# Patient Record
Sex: Female | Born: 1950 | Race: White | Hispanic: No | Marital: Single | Smoking: Never smoker
Health system: Southern US, Community
[De-identification: ages and names within clinical notes are randomized; demographics above are authoritative.]

## PROBLEM LIST (undated history)

## (undated) DIAGNOSIS — D649 Anemia, unspecified: Secondary | ICD-10-CM

## (undated) DIAGNOSIS — I517 Cardiomegaly: Secondary | ICD-10-CM

## (undated) DIAGNOSIS — K635 Polyp of colon: Secondary | ICD-10-CM

## (undated) DIAGNOSIS — Q2381 Bicuspid aortic valve: Secondary | ICD-10-CM

## (undated) DIAGNOSIS — K5790 Diverticulosis of intestine, part unspecified, without perforation or abscess without bleeding: Secondary | ICD-10-CM

## (undated) DIAGNOSIS — M1990 Unspecified osteoarthritis, unspecified site: Secondary | ICD-10-CM

## (undated) DIAGNOSIS — E119 Type 2 diabetes mellitus without complications: Secondary | ICD-10-CM

## (undated) DIAGNOSIS — T7840XA Allergy, unspecified, initial encounter: Secondary | ICD-10-CM

## (undated) DIAGNOSIS — Q231 Congenital insufficiency of aortic valve: Secondary | ICD-10-CM

## (undated) DIAGNOSIS — F419 Anxiety disorder, unspecified: Secondary | ICD-10-CM

## (undated) DIAGNOSIS — C801 Malignant (primary) neoplasm, unspecified: Secondary | ICD-10-CM

## (undated) DIAGNOSIS — K589 Irritable bowel syndrome without diarrhea: Secondary | ICD-10-CM

## (undated) DIAGNOSIS — I10 Essential (primary) hypertension: Secondary | ICD-10-CM

## (undated) DIAGNOSIS — I77810 Thoracic aortic ectasia: Secondary | ICD-10-CM

## (undated) HISTORY — DX: Diverticulosis of intestine, part unspecified, without perforation or abscess without bleeding: K57.90

## (undated) HISTORY — PX: COLONOSCOPY: SHX174

## (undated) HISTORY — DX: Unspecified osteoarthritis, unspecified site: M19.90

## (undated) HISTORY — PX: TOOTH EXTRACTION: SUR596

## (undated) HISTORY — DX: Polyp of colon: K63.5

## (undated) HISTORY — DX: Irritable bowel syndrome, unspecified: K58.9

## (undated) HISTORY — DX: Cardiomegaly: I51.7

## (undated) HISTORY — DX: Anemia, unspecified: D64.9

## (undated) HISTORY — DX: Anxiety disorder, unspecified: F41.9

## (undated) HISTORY — DX: Congenital insufficiency of aortic valve: Q23.1

## (undated) HISTORY — PX: POLYPECTOMY: SHX149

## (undated) HISTORY — DX: Essential (primary) hypertension: I10

## (undated) HISTORY — PX: BELPHAROPTOSIS REPAIR: SHX369

## (undated) HISTORY — DX: Thoracic aortic ectasia: I77.810

## (undated) HISTORY — DX: Bicuspid aortic valve: Q23.81

## (undated) HISTORY — DX: Allergy, unspecified, initial encounter: T78.40XA

## (undated) HISTORY — PX: TUBAL LIGATION: SHX77

---

## 1999-03-16 ENCOUNTER — Other Ambulatory Visit: Admission: RE | Admit: 1999-03-16 | Discharge: 1999-03-16 | Payer: Self-pay | Admitting: Obstetrics and Gynecology

## 2000-06-05 ENCOUNTER — Other Ambulatory Visit: Admission: RE | Admit: 2000-06-05 | Discharge: 2000-06-05 | Payer: Self-pay | Admitting: Obstetrics and Gynecology

## 2000-06-12 ENCOUNTER — Encounter: Admission: RE | Admit: 2000-06-12 | Discharge: 2000-06-12 | Payer: Self-pay | Admitting: Obstetrics and Gynecology

## 2000-06-12 ENCOUNTER — Encounter: Payer: Self-pay | Admitting: Obstetrics and Gynecology

## 2001-06-19 ENCOUNTER — Encounter: Payer: Self-pay | Admitting: Obstetrics and Gynecology

## 2001-06-19 ENCOUNTER — Encounter: Admission: RE | Admit: 2001-06-19 | Discharge: 2001-06-19 | Payer: Self-pay | Admitting: Obstetrics and Gynecology

## 2001-07-14 ENCOUNTER — Other Ambulatory Visit: Admission: RE | Admit: 2001-07-14 | Discharge: 2001-07-14 | Payer: Self-pay | Admitting: Obstetrics and Gynecology

## 2001-10-09 ENCOUNTER — Encounter: Admission: RE | Admit: 2001-10-09 | Discharge: 2002-01-07 | Payer: Self-pay | Admitting: Family Medicine

## 2002-07-02 ENCOUNTER — Encounter: Admission: RE | Admit: 2002-07-02 | Discharge: 2002-07-02 | Payer: Self-pay | Admitting: Obstetrics and Gynecology

## 2002-07-02 ENCOUNTER — Encounter: Payer: Self-pay | Admitting: Obstetrics and Gynecology

## 2002-08-31 ENCOUNTER — Other Ambulatory Visit: Admission: RE | Admit: 2002-08-31 | Discharge: 2002-08-31 | Payer: Self-pay | Admitting: Obstetrics and Gynecology

## 2002-12-17 ENCOUNTER — Ambulatory Visit (HOSPITAL_COMMUNITY): Admission: RE | Admit: 2002-12-17 | Discharge: 2002-12-17 | Payer: Self-pay | Admitting: Gastroenterology

## 2002-12-17 ENCOUNTER — Encounter (INDEPENDENT_AMBULATORY_CARE_PROVIDER_SITE_OTHER): Payer: Self-pay

## 2002-12-17 ENCOUNTER — Encounter (INDEPENDENT_AMBULATORY_CARE_PROVIDER_SITE_OTHER): Payer: Self-pay | Admitting: *Deleted

## 2003-09-01 ENCOUNTER — Other Ambulatory Visit: Admission: RE | Admit: 2003-09-01 | Discharge: 2003-09-01 | Payer: Self-pay | Admitting: Obstetrics and Gynecology

## 2003-09-15 ENCOUNTER — Encounter: Admission: RE | Admit: 2003-09-15 | Discharge: 2003-09-15 | Payer: Self-pay | Admitting: Obstetrics and Gynecology

## 2004-02-17 ENCOUNTER — Emergency Department (HOSPITAL_COMMUNITY): Admission: EM | Admit: 2004-02-17 | Discharge: 2004-02-17 | Payer: Self-pay | Admitting: Emergency Medicine

## 2004-04-25 ENCOUNTER — Encounter: Admission: RE | Admit: 2004-04-25 | Discharge: 2004-04-25 | Payer: Self-pay | Admitting: Family Medicine

## 2004-09-15 ENCOUNTER — Ambulatory Visit (HOSPITAL_COMMUNITY): Admission: RE | Admit: 2004-09-15 | Discharge: 2004-09-15 | Payer: Self-pay | Admitting: Obstetrics and Gynecology

## 2004-12-01 ENCOUNTER — Ambulatory Visit: Payer: Self-pay | Admitting: Internal Medicine

## 2005-01-23 ENCOUNTER — Ambulatory Visit: Payer: Self-pay | Admitting: Internal Medicine

## 2005-07-19 ENCOUNTER — Ambulatory Visit: Payer: Self-pay | Admitting: Internal Medicine

## 2005-09-27 ENCOUNTER — Encounter: Admission: RE | Admit: 2005-09-27 | Discharge: 2005-09-27 | Payer: Self-pay | Admitting: Obstetrics and Gynecology

## 2006-08-26 ENCOUNTER — Ambulatory Visit: Payer: Self-pay | Admitting: Internal Medicine

## 2006-10-01 ENCOUNTER — Encounter: Admission: RE | Admit: 2006-10-01 | Discharge: 2006-10-01 | Payer: Self-pay | Admitting: Obstetrics and Gynecology

## 2007-10-06 ENCOUNTER — Encounter: Admission: RE | Admit: 2007-10-06 | Discharge: 2007-10-06 | Payer: Self-pay | Admitting: Obstetrics and Gynecology

## 2007-10-15 ENCOUNTER — Encounter: Admission: RE | Admit: 2007-10-15 | Discharge: 2007-10-15 | Payer: Self-pay | Admitting: Obstetrics and Gynecology

## 2007-10-16 ENCOUNTER — Encounter: Payer: Self-pay | Admitting: Internal Medicine

## 2007-10-20 ENCOUNTER — Telehealth: Payer: Self-pay | Admitting: Internal Medicine

## 2008-09-06 DIAGNOSIS — K5289 Other specified noninfective gastroenteritis and colitis: Secondary | ICD-10-CM

## 2008-09-06 DIAGNOSIS — I10 Essential (primary) hypertension: Secondary | ICD-10-CM

## 2008-09-06 DIAGNOSIS — Z860100 Personal history of colon polyps, unspecified: Secondary | ICD-10-CM | POA: Insufficient documentation

## 2008-09-06 DIAGNOSIS — F411 Generalized anxiety disorder: Secondary | ICD-10-CM

## 2008-09-06 DIAGNOSIS — K589 Irritable bowel syndrome without diarrhea: Secondary | ICD-10-CM

## 2008-09-08 ENCOUNTER — Ambulatory Visit: Payer: Self-pay | Admitting: Internal Medicine

## 2008-09-08 DIAGNOSIS — K921 Melena: Secondary | ICD-10-CM | POA: Insufficient documentation

## 2008-09-08 LAB — CONVERTED CEMR LAB
Basophils Absolute: 0 10*3/uL (ref 0.0–0.1)
HCT: 42.4 % (ref 36.0–46.0)
Hemoglobin: 14.2 g/dL (ref 12.0–15.0)
Iron: 56 ug/dL (ref 42–145)
Lymphs Abs: 0.9 10*3/uL (ref 0.7–4.0)
MCV: 87.4 fL (ref 78.0–100.0)
Monocytes Absolute: 0.7 10*3/uL (ref 0.1–1.0)

## 2008-09-24 ENCOUNTER — Telehealth: Payer: Self-pay | Admitting: Internal Medicine

## 2008-09-28 ENCOUNTER — Ambulatory Visit: Payer: Self-pay | Admitting: Internal Medicine

## 2008-09-30 LAB — CONVERTED CEMR LAB
OCCULT 1: NEGATIVE
OCCULT 2: NEGATIVE

## 2008-10-06 ENCOUNTER — Encounter: Admission: RE | Admit: 2008-10-06 | Discharge: 2008-10-06 | Payer: Self-pay | Admitting: Obstetrics and Gynecology

## 2008-10-19 ENCOUNTER — Ambulatory Visit: Payer: Self-pay | Admitting: Internal Medicine

## 2008-10-19 LAB — CONVERTED CEMR LAB
OCCULT 2: NEGATIVE
OCCULT 3: NEGATIVE

## 2008-10-20 ENCOUNTER — Encounter: Payer: Self-pay | Admitting: Internal Medicine

## 2008-10-20 ENCOUNTER — Telehealth: Payer: Self-pay | Admitting: Internal Medicine

## 2008-12-21 ENCOUNTER — Encounter: Admission: RE | Admit: 2008-12-21 | Discharge: 2009-03-21 | Payer: Self-pay | Admitting: Family Medicine

## 2009-01-03 ENCOUNTER — Encounter: Admission: RE | Admit: 2009-01-03 | Discharge: 2009-01-03 | Payer: Self-pay | Admitting: Orthopedic Surgery

## 2009-03-31 ENCOUNTER — Encounter: Payer: Self-pay | Admitting: Internal Medicine

## 2009-09-12 ENCOUNTER — Telehealth: Payer: Self-pay | Admitting: Internal Medicine

## 2009-09-21 ENCOUNTER — Encounter: Payer: Self-pay | Admitting: Internal Medicine

## 2009-10-07 ENCOUNTER — Encounter: Admission: RE | Admit: 2009-10-07 | Discharge: 2009-10-07 | Payer: Self-pay | Admitting: Obstetrics and Gynecology

## 2009-11-01 ENCOUNTER — Encounter (INDEPENDENT_AMBULATORY_CARE_PROVIDER_SITE_OTHER): Payer: Self-pay | Admitting: *Deleted

## 2009-12-09 ENCOUNTER — Telehealth: Payer: Self-pay | Admitting: Internal Medicine

## 2010-01-24 ENCOUNTER — Telehealth (INDEPENDENT_AMBULATORY_CARE_PROVIDER_SITE_OTHER): Payer: Self-pay | Admitting: *Deleted

## 2010-01-27 ENCOUNTER — Ambulatory Visit: Payer: Self-pay | Admitting: Internal Medicine

## 2010-06-09 ENCOUNTER — Telehealth: Payer: Self-pay | Admitting: Internal Medicine

## 2010-06-10 ENCOUNTER — Other Ambulatory Visit: Payer: Self-pay | Admitting: Obstetrics and Gynecology

## 2010-06-10 DIAGNOSIS — Z0000 Encounter for general adult medical examination without abnormal findings: Secondary | ICD-10-CM

## 2010-06-19 ENCOUNTER — Encounter: Payer: Self-pay | Admitting: Internal Medicine

## 2010-06-20 NOTE — Progress Notes (Signed)
  Phone Note Other Incoming   Request: Send information Summary of Call: Request received from Disability Determination Services forwarded to [employer].       

## 2010-06-20 NOTE — Progress Notes (Signed)
Summary: Medication refill  Phone Note Call from Patient Call back at Home Phone [phone]   Caller: Patient Call For: Dr. Juanda Chance Reason for Call: Refill Medication Summary of Call: Refill on her [facility] appt. for 01-27-10 Initial call taken by: Karna Christmas,  December 09, 2009 4:28 PM  Follow-up for Phone Call        Prescription sent to local pharmacy until her appointment on 01/27/10. Follow-up by: Lamona Curl CMA Duncan Dull),  December 09, 2009 4:51 PM    New/Updated Medications: ALPRAZOLAM 0.25 MG TABS (ALPRAZOLAM) Take 1 tablet by mouth twice a day as needed. MUST KEEP OFFICE VISIT FOR FURTHER REFILLS!! Prescriptions: ALPRAZOLAM 0.25 MG TABS (ALPRAZOLAM) Take 1 tablet by mouth twice a day as needed. MUST KEEP OFFICE VISIT FOR FURTHER REFILLS!!  #60 x 1   Entered by:   Lamona Curl CMA (AAMA)   Authorized by:   Hart Carwin MD   Signed by:   Lamona Curl CMA (AAMA) on 12/09/2009   Method used:   Printed then faxed to ...       [facility] [place] [phone][facility] ([facility]ail)       [phone] [address]       [place], [place]  [address]       Ph: [phone] or [phone]       Fax: [phone]   RxID:   [id number]

## 2010-06-20 NOTE — Progress Notes (Signed)
Summary: refill  Phone Note Call from Patient Call back at Home Phone [phone]   Caller: Patient Call For: Juanda Chance Reason for Call: Talk to Nurse Summary of Call: Patient needs refills on her Xanax Initial call taken by: Tawni Levy,  September 12, 2009 11:51 AM  Follow-up for Phone Call        I have explained to the patient that she should have enough refills on xanax until 09/28/09 (she was given #180, enough for 3 months supply if she is taking 2 daily and was also given an additional refill, which should make prescription last until 09/28/09). I asked patient if she is taking prescription only two times a day at the most, and she states that she has been taking as directed. Patient will call back closer to 09/28/09 for refills. Follow-up by: Lamona Curl CMA Duncan Dull),  September 12, 2009 12:03 PM

## 2010-06-20 NOTE — Letter (Signed)
Summary: Colonoscopy Letter  [facility]  [address] [place], [place] [address]   Phone: [phone]  Fax: [phone]      November 01, 2009 MRN: [id number]   BEYA TIPPS [address] [place], [place]  [address]   Dear Ms. Ardelle Anton,   According to your medical record, it is time for you to schedule a Colonoscopy. The American Cancer Society recommends this procedure as a method to detect early colon cancer. Patients with a family history of colon cancer, or a personal history of colon polyps or inflammatory bowel disease are at increased risk.  This letter has beeen generated based on the recommendations made at the time of your procedure. If you feel that in your particular situation this may no longer apply, please contact our office.  Please call our office at [phone] to schedule this appointment or to update your records at your earliest convenience.  Thank you for cooperating with [place] to provide you with the very best care possible.   Sincerely,  Hedwig Morton. Juanda Chance, M.D.  [facility] Gastroenterology Division [phone]

## 2010-06-20 NOTE — Miscellaneous (Signed)
Summary: Xanax Rx  Clinical Lists Changes  Medications: Changed medication from ALPRAZOLAM 0.25 MG TABS (ALPRAZOLAM) Take 1 tablet by mouth twice a day as needed. to ALPRAZOLAM 0.25 MG TABS (ALPRAZOLAM) Take 1 tablet by mouth twice a day as needed. MUST HAVE OFFICE VISIT FOR FURTHER REFILLS - Signed Rx of ALPRAZOLAM 0.25 MG TABS (ALPRAZOLAM) Take 1 tablet by mouth twice a day as needed. MUST HAVE OFFICE VISIT FOR FURTHER REFILLS;  #180 x 0;  Signed;  Entered by: Lamona Curl CMA (AAMA);  Authorized by: Hart Carwin MD;  Method used: [facility]. [phone]*, [address], [place], [place]  [address], Ph: [phone] or [phone], Fax: [phone]    Prescriptions: ALPRAZOLAM 0.25 MG TABS (ALPRAZOLAM) Take 1 tablet by mouth twice a day as needed. MUST HAVE OFFICE VISIT FOR FURTHER REFILLS  #180 x 0   Entered by:   Lamona Curl CMA (AAMA)   Authorized by:   Hart Carwin MD   Signed by:   Lamona Curl CMA (AAMA) on 09/21/2009   Method used:   Printed then faxed to ...       [facility] [place] [phone]* (retail)       [phone] [address]       [place], [place]  [address]       Ph: [phone] or [phone]       Fax: [phone]   RxID:   [id number]

## 2010-06-20 NOTE — Assessment & Plan Note (Signed)
Summary: med refill--ch.   History of Present Illness Primary GI MD: [name] MD Primary Provider: Laurann Montana, MD Chief Complaint: yearly f/u for IBS and medication refills. No GI symptoms.  History of Present Illness:   This is a 60 year old white female with diarrhea predominant irritable bowel syndrome. This is a followup appointment. She has lost 50 pounds on a modified diet from 250 pounds to a current 212 pounds and her diarrhea has completely resolved. Her diabetes has improved in general. She has been very happy. She gives credit to the Xanax 0.25 mg twice a day which seems to have helped make her to follow  her diet. A colonoscopy in July 2004 showed 2 hyperplastic polyps. A recall colonoscopy would be due now but she is currently unemployed and would like to delay the colonoscopy. Her last hemoglobin was 14.2, hematocrit was 42.4 with her iron saturation was 13%. Her stool Hemoccult cards were negative. She needs refill on her Xanax.   GI Review of Systems      Denies abdominal pain, acid reflux, belching, bloating, chest pain, dysphagia with liquids, dysphagia with solids, heartburn, loss of appetite, nausea, vomiting, vomiting blood, weight loss, and  weight gain.        Denies anal fissure, black tarry stools, change in bowel habit, constipation, diarrhea, diverticulosis, fecal incontinence, heme positive stool, hemorrhoids, irritable bowel syndrome, jaundice, light color stool, liver problems, rectal bleeding, and  rectal pain.    Current Medications (verified): 1)  Alprazolam 0.25 Mg Tabs (Alprazolam) .... Take 1 Tablet By Mouth Twice A Day As Needed. Must Keep Office Visit For Further Refills!! 2)  Cholestyramine 4 Gm Pack (Cholestyramine) .... Take 1/2-1 Packet Daily Dissolved in Water or Juice As Needed Take 1 Hour Apart From All Other Medications! 3)  Glipizide 10 Mg Tabs (Glipizide) .... Two Times A Day 4)  Quinapril Hcl 40 Mg Tabs (Quinapril Hcl) .... Once Daily 5)   Amlodipine Besylate 10 Mg Tabs (Amlodipine Besylate) .... Once Daily 6)  Cinnamon 500 Mg Tabs (Cinnamon) .... Once Daily 7)  Aspirin 81 Mg Tbec (Aspirin) .... Once Daily 8)  Calcium 600/vitamin D 600-400 Mg-Unit Tabs (Calcium Carbonate-Vitamin D) .... Once Daily 9)  Centrum Silver Ultra Womens  Tabs (Multiple Vitamins-Minerals) .... Once Daily 10)  Icaps  Caps (Multiple Vitamins-Minerals) .... Two Times A Day 11)  Onglyza 5 Mg Tabs (Saxagliptin Hcl) .... One Tablet By Mouth Once Daily 12)  Actos 15 Mg Tabs (Pioglitazone Hcl) .... One Tablet By Mouth Once Daily 13)  Meloxicam 15 Mg Tabs (Meloxicam) .... One Tablet By Mouth At Bedtime 14)  Fluoxetine Hcl 20 Mg Caps (Fluoxetine Hcl) .... One Capsule By Mouth Once Daily 15)  Losartan Potassium-Hctz 100-12.5 Mg Tabs (Losartan Potassium-Hctz) .... One Tablet By Mouth Once Daily 16)  Fish Oil 1000 Mg Caps (Omega-3 Fatty Acids) .... One Capsule By Mouth Once Daily 17)  Garlic Oil 1000 Mg Caps (Garlic) .... One Capsule By Mouth Once Daily  Allergies (verified): No Known Drug Allergies  Past History:  Past Medical History: Reviewed history from 09/06/2008 and no changes required. Current Problems:  ANXIETY (ICD-300.00) DIABETES MELLITUS (ICD-250.00) HYPERTENSION (ICD-401.9) IRRITABLE BOWEL SYNDROME (ICD-564.1) Hx of GASTROENTERITIS (ICD-558.9) COLONIC POLYPS, HYPERPLASTIC, HX OF (ICD-V12.72)  Past Surgical History: Reviewed history from 09/06/2008 and no changes required. Tubal Ligation  Family History: Reviewed history from 09/06/2008 and no changes required. Family History of Diabetes: Uncle No FH of Colon Cancer:  Social History: Reviewed history from 09/08/2008 and no  changes required. 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   Review of Systems  The patient denies allergy/sinus, anemia, anxiety-new, arthritis/joint pain, back pain, blood in urine, breast changes/lumps, change in vision,  confusion, cough, coughing up blood, depression-new, fainting, fatigue, fever, headaches-new, hearing problems, heart murmur, heart rhythm changes, itching, menstrual pain, muscle pains/cramps, night sweats, nosebleeds, pregnancy symptoms, shortness of breath, skin rash, sleeping problems, sore throat, swelling of feet/legs, swollen lymph glands, thirst - excessive , urination - excessive , urination changes/pain, urine leakage, vision changes, and voice change.         Pertinent positive and negative review of systems were noted in the above HPI. All other ROS was otherwise negative.   Vital Signs:  Patient profile:   60 year old female Height:      65 inches Weight:      212 pounds BMI:     35.41 Pulse rate:   60 / minute Pulse rhythm:   regular BP sitting:   128 / 70  (left arm) Cuff size:   regular  Vitals Entered By: Christie Nottingham CMA Duncan Dull) (January 27, 2010 10:01 AM)  Physical Exam  General:  Well developed, well nourished, no acute distress. Eyes:  PERRLA, no icterus. Mouth:  No deformity or lesions, dentition normal. Neck:  Supple; no masses or thyromegaly. Lungs:  Clear throughout to auscultation. Heart:  Regular rate and rhythm; no murmurs, rubs,  or bruits. Abdomen:  soft mildly obese abdomen with normoactive bowel sounds. No tenderness. Liver edge at costal margin. No distention. Extremities:  No clubbing, cyanosis, edema or deformities noted. Skin:  Intact without significant lesions or rashes. Psych:  Alert and cooperative. Normal mood and affect.   Impression & Recommendations:  Problem # 1:  IRRITABLE BOWEL SYNDROME (ICD-564.1) Patient has IBS which is under good control with a modified diet and an intentional weight loss of 50 pounds. She is managing her anxiety and stress with Xanax 0.25 mg twice a day. We will refill her medications.  Problem # 2:  COLONIC POLYPS, HYPERPLASTIC, HX OF (ICD-V12.72) Assessment: New Patient has 2 hyperplastic polyps on a  colonoscopy in 2004. Patient received a recall letter for a colonoscopy but cannot afford to have one at this time while she is unemployed. We will reconsider a colonoscopy in one .year.  Patient Instructions: 1)  Continue a carbohydrate modified diet and weight loss. 2)  Resume Xanax 0.25 mg p.o. b.i.d. 3)  Recall colonoscopy August 2012. 4)  Copy sent to : Dr C.White 5)  The medication list was reviewed and reconciled.  All changed / newly prescribed medications were explained.  A complete medication list was provided to the patient / caregiver. Prescriptions: ALPRAZOLAM 0.25 MG TABS (ALPRAZOLAM) Take 1 tablet by mouth twice a day as needed. NOT TO BE FILLED BEFORE 02/09/10  #60 x 3   Entered by:   Lamona Curl CMA (AAMA)   Authorized by:   Hart Carwin MD   Signed by:   Lamona Curl CMA (AAMA) on 01/27/2010   Method used:   Printed then faxed to ...       [facility] [place] [phone]* (retail)       [phone] [address]       [place], [place]  [address]       Ph: [phone] or [phone]       Fax: [phone]   RxID:   [id number]

## 2010-06-22 NOTE — Progress Notes (Signed)
Summary: ibs  Phone Note Call from Patient Call back at Home Phone [phone]   Caller: Patient Call For: Dr Juanda Chance Reason for Call: Talk to Nurse Summary of Call: Patient states that she's having trouble with IBS wants to speak to nurse Initial call taken by: Tawni Levy,  June 09, 2010 9:16 AM  Follow-up for Phone Call        Patient called to let Dr. Juanda Chance know she had  liquid diarrhea 4-5 times last night after supper.No bleeding. She took cholesyramine 1/2 pack and it has resolved. She thought Dr. Juanda Chance would want to know. Follow-up by: Jesse Fall RN,  June 09, 2010 10:46 AM  Additional Follow-up for Phone Call Additional follow up Details #1::        sounds like her IBS flare up. if diarrhea continues, consider stool culture, C.Diff, lactoferin and empirical Flagyl 250 three times a day x 1 week. Additional Follow-up by: Hart Carwin MD,  June 09, 2010 2:33 PM    Additional Follow-up for Phone Call Additional follow up Details #2::    Patient notified to call me on Monday if diarrhea continues. Jesse Fall RN  June 09, 2010 3:22 PM Called patient to check up on her. She has not had anymore diarrhea. Follow-up by: Jesse Fall RN,  June 12, 2010 9:27 AM

## 2010-06-28 NOTE — Miscellaneous (Signed)
Summary: xanax refills  Clinical Lists Changes  Medications: Changed medication from ALPRAZOLAM 0.25 MG TABS (ALPRAZOLAM) Take 1 tablet by mouth twice a day as needed. NOT TO BE FILLED BEFORE 02/09/10 to ALPRAZOLAM 0.25 MG TABS (ALPRAZOLAM) Take 1 tablet by mouth twice a day as needed. - Signed Rx of ALPRAZOLAM 0.25 MG TABS (ALPRAZOLAM) Take 1 tablet by mouth twice a day as needed.;  #60 x 1;  Signed;  Entered by: Lamona Curl CMA (AAMA);  Authorized by: Hart Carwin MD;  Method used: Printed then [facility] to [facility]. [phone]*, [address] [place], [place]  [address], Ph: [phone] or [phone], Fax: [phone]    Prescriptions: ALPRAZOLAM 0.25 MG TABS (ALPRAZOLAM) Take 1 tablet by mouth twice a day as needed.  #60 x 1   Entered by:   Lamona Curl CMA (AAMA)   Authorized by:   Hart Carwin MD   Signed by:   Lamona Curl CMA (AAMA) on 06/19/2010   Method used:   Printed then faxed to ...       [facility] [place] [phone]* (retail)       [phone] [address]       [place], [place]  [address]       Ph: [phone] or [phone]       Fax: [phone]   RxID:   [id number]

## 2010-08-17 ENCOUNTER — Other Ambulatory Visit: Payer: Self-pay | Admitting: *Deleted

## 2010-08-17 MED ORDER — ALPRAZOLAM 0.25 MG PO TABS: ORAL_TABLET | ORAL | Status: DC

## 2010-08-17 NOTE — Telephone Encounter (Signed)
Rx for alprazolam sent to pharmacy

## 2010-08-18 ENCOUNTER — Encounter: Payer: Self-pay | Admitting: *Deleted

## 2010-08-18 NOTE — Telephone Encounter (Deleted)
Message copied by Vernia Buff on Fri Aug 18, 2010  8:21 AM ------      Message from: [place], [place]      Created: Thu Aug 17, 2010  4:58 PM       OK to refill DB      ----- Message -----         From: Vernia Buff, CMA         Sent: 08/17/2010   4:14 PM           To: Hart Carwin, MD            Dr Juanda Chance,      I have gotten a fax from [facility] for approval of dicyclomine 90 day supply for patient. However, it looks like patient was last given rx on 01/02/10 with 1 refill as per your colonoscopy instructions. Would you like me to continue giving her refills of this medication?

## 2010-10-06 NOTE — Op Note (Signed)
   NAME:  Amber Bishop, Amber Bishop                         ACCOUNT NO.:  [id number]   MEDICAL RECORD NO.:  [id number]                   PATIENT TYPE:  AMB   LOCATION:  ENDO                                 FACILITY:  [facility]   PHYSICIAN:  Graylin Shiver, M.D.                DATE OF BIRTH:  [birth year]   DATE OF PROCEDURE:  12/17/2002  DATE OF DISCHARGE:                                 OPERATIVE REPORT   PROCEDURE:  Colonoscopy with biopsy.   INDICATIONS FOR PROCEDURE:  Screening.   Informed consent was obtained after explanation of the risks of bleeding,  infection and perforation.   PREMEDICATION:  Fentanyl 75 mcg IV, Versed 8 mg IV.   DESCRIPTION OF PROCEDURE:  With the patient in the left lateral decubitus  position, a rectal exam was performed and no masses were felt. The Olympus  colonoscope was inserted into the rectum and advanced around the colon to  the cecum. Cecal landmarks were identified. The cecum and ascending colon  were normal. The transverse colon was normal.  The descending colon was  normal. In the distal sigmoid, there were three very tiny 2-3 mm polyps  which were removed with cold forceps. The rectum was normal. She tolerated  the procedure well without complications.   IMPRESSION:  Three small polyps in the sigmoid colon which were removed  otherwise normal colonoscopy to the cecum.   PLAN:  The biopsies will be checked.                                               Graylin Shiver, M.D.    Germain Osgood  D:  12/17/2002  T:  12/17/2002  Job:  [id number]   cc:   Donia Guiles, M.D.  [address] [place]  [place] [address]  Fax: [phone]

## 2010-10-06 NOTE — Assessment & Plan Note (Signed)
[facility]                         GASTROENTEROLOGY OFFICE NOTE   NAME:Amber Bishop                      MRN:          [id number]  DATE:08/26/2006                            DOB:          [birth year]    Amber Bishop is a very nice, 60 year old, white female who we have been  treating for irritable bowel syndrome. She had an acute diarrheal  illness approximately 4 weeks ago consistent with [place]  gastroenteritis. She was treated with bowel rest, subsequently a bland  diet and has done quite well. Today she is completely recovered. She is  back on her Xanax 0.25 mg twice a day and Questran 4 gram a day for her  diarrhea. Her bowel habits are regular and she has no complaints.   PHYSICAL EXAMINATION:  VITAL SIGNS:  Blood pressure 122/78, pulse 68 and  weight 247 pounds.  GENERAL:  She was alert, oriented, quite talkative.  LUNGS:  Clear to auscultation.  COR:  Normal S1, normal S2.  ABDOMEN:  Soft, nontender with normal active bowel sounds, no  distention.  RECTAL:  Hemoccult negative stool.   IMPRESSION:  27. A 60 year old white female status post acute gastroenteritis most      likely viral now fully recovered.  2. Underlying irritable bowel syndrome now under control.   PLAN:  Continue current regimen. Will see her on a p.r.n. basis.     Amber Bishop. Amber Chance, MD  Electronically Signed    DMB/MedQ  DD: 08/26/2006  DT: 08/27/2006  Job #: [id number]   cc:   Amber Bishop. Cliffton Asters, M.D.

## 2010-10-09 ENCOUNTER — Ambulatory Visit
Admission: RE | Admit: 2010-10-09 | Discharge: 2010-10-09 | Disposition: A | Discharge status: Home / Self Care | Payer: 59 | Source: Ambulatory Visit | Attending: Obstetrics and Gynecology | Admitting: Obstetrics and Gynecology

## 2010-10-09 DIAGNOSIS — Z0000 Encounter for general adult medical examination without abnormal findings: Secondary | ICD-10-CM

## 2010-10-20 ENCOUNTER — Other Ambulatory Visit: Payer: Self-pay | Admitting: *Deleted

## 2010-10-20 MED ORDER — ALPRAZOLAM 0.25 MG PO TABS: ORAL_TABLET | ORAL | Status: DC

## 2010-11-17 ENCOUNTER — Other Ambulatory Visit (HOSPITAL_COMMUNITY)
Admission: RE | Admit: 2010-11-17 | Discharge: 2010-11-17 | Disposition: A | Discharge status: Home / Self Care | Payer: 59 | Source: Ambulatory Visit | Attending: Family Medicine | Admitting: Family Medicine

## 2010-11-17 ENCOUNTER — Other Ambulatory Visit: Payer: Self-pay | Admitting: Family Medicine

## 2010-11-17 DIAGNOSIS — Z0000 Encounter for general adult medical examination without abnormal findings: Secondary | ICD-10-CM | POA: Insufficient documentation

## 2010-12-14 ENCOUNTER — Other Ambulatory Visit: Payer: Self-pay | Admitting: *Deleted

## 2010-12-14 MED ORDER — ALPRAZOLAM 0.25 MG PO TABS: ORAL_TABLET | ORAL | Status: DC

## 2011-01-17 ENCOUNTER — Telehealth: Payer: Self-pay | Admitting: Internal Medicine

## 2011-01-17 ENCOUNTER — Encounter: Payer: Self-pay | Admitting: Internal Medicine

## 2011-01-17 NOTE — Telephone Encounter (Signed)
Error

## 2011-01-18 NOTE — Telephone Encounter (Signed)
Dr Juanda Chance,  This patient states that she needs a [facility] wants her to get a 90 day supply of cholestyramine. However, we have not given it to her since 2009. I asked her if she is having new problems now that would prompt her to request the medication. She states that she is not. She states that she has very occasional problems with diarrhea and only uses 1 packet very rarely. I asked if she has tried imodium in the past. She states that it does not work for her. Do you want me to give her refills of cholestyramine?

## 2011-01-21 NOTE — Telephone Encounter (Signed)
Yes, she needs Cholestyramine for her diarrhea but needs an OV to get  A refill. Give her #30 packs till she is seen

## 2011-01-23 NOTE — Telephone Encounter (Signed)
Patient advised she needs office visit. She will come for visit tomorrow and we will refill meds at that time.

## 2011-01-24 ENCOUNTER — Encounter: Payer: Self-pay | Admitting: Internal Medicine

## 2011-01-24 ENCOUNTER — Ambulatory Visit (INDEPENDENT_AMBULATORY_CARE_PROVIDER_SITE_OTHER): Payer: 59 | Admitting: Internal Medicine

## 2011-01-24 VITALS — BP 132/74 | HR 62 | Ht 65.0 in | Wt 197.0 lb

## 2011-01-24 DIAGNOSIS — R197 Diarrhea, unspecified: Secondary | ICD-10-CM

## 2011-01-24 DIAGNOSIS — K589 Irritable bowel syndrome without diarrhea: Secondary | ICD-10-CM

## 2011-01-24 DIAGNOSIS — Z86010 Personal history of colonic polyps: Secondary | ICD-10-CM

## 2011-01-24 MED ORDER — ALPRAZOLAM 0.25 MG PO TABS: ORAL_TABLET | ORAL | Status: DC

## 2011-01-24 MED ORDER — CHOLESTYRAMINE 4 G PO PACK: PACK | ORAL | Status: DC

## 2011-01-24 NOTE — Patient Instructions (Addendum)
Your Lanetta Inch has been sent to [facility] and your Xanax has been printed and given to you today. Call back to schedule your Colonoscopy and pre visit when you have figured out which insurance plan is better for your financial needs.  Dr Laurann Montana

## 2011-01-24 NOTE — Progress Notes (Signed)
Amber Bishop April 05, 1951 MRN [id number]        History of Present Illness:  This is a 60 y.o EF with IBS/diarrhea, LOV 01/2010, she comes for refill of her meds which include Cholestyramine packc 4gm/pack, Xanax .25 mg po bid and a modified diet resulting in an intentional weight loss of 60 lbs. She has been very happy with her progress.Last colonoscopy by Dr Evette Cristal 2004 showed 2 hyperplasic polyps., She is due for recall colon.   Past Medical History  Diagnosis Date  . Anxiety   . Diabetes mellitus   . Hypertension   . IBS (irritable bowel syndrome)   . Hyperplastic colon polyp    Past Surgical History  Procedure Date  . Tubal ligation     reports that she has never smoked. She has never used smokeless tobacco. She reports that she does not drink alcohol or use illicit drugs. family history includes Diabetes in an unspecified family member.  There is no history of Colon cancer. No Known Allergies      Review of Systems:negaTIVE FOR sob,CHEST PAIN, DYSPHAGIA  The remainder of the 10  point ROS is negative except as outlined in H&P   Physical Exam: General appearance  Well developed in no distress Eyes- non icteric HEENT nontraumatic, normocephalic Mouth no lesions, tongue papillated, no cheilosis Neck supple without adenopathy, thyroid not enlarged, no carotid bruits, no JVD Lungs Clear to auscultation bilaterally Cor normal S1 normal S2, regular rhythm , no murmur,  quiet precordium Abdomen  SOFT, VOLUNTARY GUARDING, NORMOACTIVE BOWL SOUNDS, NO MASS OR TENDERNESS Rectal:SOFT HEME NEGATIVE STOOL Extremities no pedal edema Skin no lesions Neurological alert and oriented x3 Psychological normal mood and  affect  Assessment and Plan:  IBS/diarrhea under good control on the above regimen. Will refill Xanax and Cholestyramine.. She is due for recall colon, will call us back since she is changing insurance.   01/24/2011 Amber Bishop

## 2011-02-12 ENCOUNTER — Encounter: Payer: Self-pay | Admitting: Internal Medicine

## 2011-03-01 ENCOUNTER — Ambulatory Visit (AMBULATORY_SURGERY_CENTER): Payer: Medicare Other | Admitting: *Deleted

## 2011-03-01 ENCOUNTER — Encounter: Payer: Self-pay | Admitting: Internal Medicine

## 2011-03-01 DIAGNOSIS — Z1211 Encounter for screening for malignant neoplasm of colon: Secondary | ICD-10-CM

## 2011-03-01 DIAGNOSIS — Z86010 Personal history of colonic polyps: Secondary | ICD-10-CM

## 2011-03-01 MED ORDER — PEG-KCL-NACL-NASULF-NA ASC-C 100 G PO SOLR: 1.0000 | Freq: Once | ORAL | Status: DC

## 2011-03-15 ENCOUNTER — Encounter: Payer: Self-pay | Admitting: Internal Medicine

## 2011-03-15 ENCOUNTER — Ambulatory Visit (AMBULATORY_SURGERY_CENTER): Payer: Medicare Other | Admitting: Internal Medicine

## 2011-03-15 DIAGNOSIS — Z1211 Encounter for screening for malignant neoplasm of colon: Secondary | ICD-10-CM

## 2011-03-15 DIAGNOSIS — Z860100 Personal history of colon polyps, unspecified: Secondary | ICD-10-CM

## 2011-03-15 DIAGNOSIS — K921 Melena: Secondary | ICD-10-CM

## 2011-03-15 LAB — GLUCOSE, CAPILLARY
Glucose-Capillary: 154 mg/dL — ABNORMAL HIGH (ref 70–99)
Glucose-Capillary: 121 mg/dL — ABNORMAL HIGH (ref 70–99)

## 2011-03-15 MED ORDER — SODIUM CHLORIDE 0.9 % IV SOLN: 500.0000 mL | INTRAVENOUS | Status: DC

## 2011-03-16 ENCOUNTER — Telehealth: Payer: Self-pay | Admitting: *Deleted

## 2011-03-16 NOTE — Telephone Encounter (Signed)
Follow up Call- Patient questions:  Do you have a fever, pain , or abdominal swelling? yes Pain Score  0 *  Have you tolerated food without any problems? yes  Have you been able to return to your normal activities? yes  Do you have any questions about your discharge instructions: Diet   no Medications  no Follow up visit  no  Do you have questions or concerns about your Care? yes  Actions: * If pain score is 4 or above: No action needed, pain <4.

## 2011-04-09 ENCOUNTER — Other Ambulatory Visit: Payer: Self-pay | Admitting: *Deleted

## 2011-04-09 MED ORDER — ALPRAZOLAM 0.25 MG PO TABS: ORAL_TABLET | ORAL | Status: DC

## 2011-07-09 ENCOUNTER — Telehealth: Payer: Self-pay | Admitting: *Deleted

## 2011-07-09 MED ORDER — ALPRAZOLAM 0.25 MG PO TABS: ORAL_TABLET | ORAL | Status: DC

## 2011-07-09 NOTE — Telephone Encounter (Signed)
rx sent

## 2011-08-31 ENCOUNTER — Other Ambulatory Visit: Payer: Self-pay | Admitting: Family Medicine

## 2011-08-31 DIAGNOSIS — Z1231 Encounter for screening mammogram for malignant neoplasm of breast: Secondary | ICD-10-CM

## 2011-09-06 ENCOUNTER — Other Ambulatory Visit: Payer: Self-pay | Admitting: *Deleted

## 2011-09-06 MED ORDER — ALPRAZOLAM 0.25 MG PO TABS: ORAL_TABLET | ORAL | Status: DC

## 2011-10-10 ENCOUNTER — Ambulatory Visit
Admission: RE | Admit: 2011-10-10 | Discharge: 2011-10-10 | Disposition: A | Discharge status: Home / Self Care | Payer: Medicare Other | Source: Ambulatory Visit | Attending: Family Medicine | Admitting: Family Medicine

## 2011-10-10 DIAGNOSIS — Z1231 Encounter for screening mammogram for malignant neoplasm of breast: Secondary | ICD-10-CM

## 2011-12-03 ENCOUNTER — Other Ambulatory Visit: Payer: Self-pay | Admitting: *Deleted

## 2011-12-03 MED ORDER — ALPRAZOLAM 0.25 MG PO TABS: ORAL_TABLET | ORAL | Status: DC

## 2012-01-02 ENCOUNTER — Other Ambulatory Visit: Payer: Self-pay | Admitting: *Deleted

## 2012-01-02 MED ORDER — ALPRAZOLAM 0.25 MG PO TABS: ORAL_TABLET | ORAL | Status: DC

## 2012-02-28 ENCOUNTER — Other Ambulatory Visit: Payer: Self-pay | Admitting: Internal Medicine

## 2012-05-12 ENCOUNTER — Other Ambulatory Visit: Payer: Self-pay | Admitting: Internal Medicine

## 2012-05-12 MED ORDER — ALPRAZOLAM 0.25 MG PO TABS: ORAL_TABLET | ORAL | Status: DC

## 2012-05-15 ENCOUNTER — Encounter: Payer: Self-pay | Admitting: *Deleted

## 2012-05-19 ENCOUNTER — Other Ambulatory Visit: Payer: Self-pay | Admitting: Ophthalmology

## 2012-06-11 ENCOUNTER — Ambulatory Visit (INDEPENDENT_AMBULATORY_CARE_PROVIDER_SITE_OTHER): Payer: Medicare Other | Admitting: Internal Medicine

## 2012-06-11 ENCOUNTER — Encounter: Payer: Self-pay | Admitting: Internal Medicine

## 2012-06-11 VITALS — BP 144/82 | HR 80 | Ht 65.0 in | Wt 216.4 lb

## 2012-06-11 DIAGNOSIS — K589 Irritable bowel syndrome without diarrhea: Secondary | ICD-10-CM

## 2012-06-11 MED ORDER — ALPRAZOLAM 0.25 MG PO TABS: ORAL_TABLET | ORAL | Status: DC

## 2012-06-11 NOTE — Patient Instructions (Addendum)
We have sent the following medications to your pharmacy for you to pick up at your convenience: Xanax  CC: Dr Laurann Montana

## 2012-06-11 NOTE — Progress Notes (Signed)
Amber Bishop 08/01/1950 MRN [id number]    History of Present Illness:  This is a 62 year old white female with severe irritable bowel syndrome under good control with Questran and Xanax. Her last appointment with [place] was in September 2012. She has intetionally lost weight and has started on healthy nutrition. She exercises several times a week. Her last colonoscopy in October 2012 showed moderately severe diverticulosis of the left colon. She had 2 hyperplastic polyps on a colonoscopy in 2004.   Past Medical History  Diagnosis Date  . Anxiety   . Diabetes mellitus   . Hypertension   . IBS (irritable bowel syndrome)   . Hyperplastic colon polyp   . Allergy   . Anemia   . Arthritis   . Diverticulosis    Past Surgical History  Procedure Date  . Tubal ligation   . Colonoscopy   . Polypectomy   . Tooth extraction     3 teeth  . Belpharoptosis repair     reports that she has never smoked. She has never used smokeless tobacco. She reports that she does not drink alcohol or use illicit drugs. family history is negative for Colon cancer, and Esophageal cancer, and Stomach cancer, . No Known Allergies      Review of Systems: Denies heartburn dysphagia abdominal pain  The remainder of the 10 point ROS is negative except as outlined in H&P   Physical Exam: General appearance  Well developed, in no distress. Overweight Eyes- non icteric. HEENT nontraumatic, normocephalic. Mouth no lesions, tongue papillated, no cheilosis. Neck supple without adenopathy, thyroid not enlarged, no carotid bruits, no JVD. Lungs Clear to auscultation bilaterally. Cor normal S1, normal S2, regular rhythm, no murmur,  quiet precordium. Abdomen: Soft nontender abdomen with normoactive bowel sounds. No distention. Liver edge at costal margin. Rectal: Not done. Extremities no pedal edema. Skin no lesions. Neurological alert and oriented x 3. Psychological normal mood and affect.  Assessment and  Plan:  Problem #1 Irritable bowel syndrome with predominant diarrhea under good control with a combination of dietary modifications, exercise, Xanax 0.25 mg twice a day and Questran which she has been able to discontinue entirely. We will see her in one year.   06/11/2012 Amber Bishop

## 2012-09-30 ENCOUNTER — Other Ambulatory Visit: Payer: Self-pay

## 2012-09-30 DIAGNOSIS — Z1231 Encounter for screening mammogram for malignant neoplasm of breast: Secondary | ICD-10-CM

## 2012-10-14 ENCOUNTER — Ambulatory Visit
Admission: RE | Admit: 2012-10-14 | Discharge: 2012-10-14 | Disposition: A | Discharge status: Home / Self Care | Payer: Medicare Other | Source: Ambulatory Visit

## 2012-10-14 DIAGNOSIS — Z1231 Encounter for screening mammogram for malignant neoplasm of breast: Secondary | ICD-10-CM

## 2012-11-26 ENCOUNTER — Telehealth: Payer: Self-pay | Admitting: Internal Medicine

## 2012-11-26 ENCOUNTER — Encounter: Payer: Self-pay | Admitting: *Deleted

## 2012-11-26 NOTE — Telephone Encounter (Signed)
Patient is asking to have a letter written to excuse her from [employer] duty on 02/10/13 d/t IBS. 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 Please, advise.

## 2012-11-26 NOTE — Telephone Encounter (Signed)
Letter written and up front for pick up as per patient request.

## 2012-11-26 NOTE — Telephone Encounter (Signed)
OK to write an axcuse note: severe IBS with bouts of diarrhea, urgency and  Rare incontinence. It  Has to be frequently excused to take a bathroom break, may be sudden and unexpected. 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

## 2012-11-30 ENCOUNTER — Other Ambulatory Visit: Payer: Self-pay | Admitting: Internal Medicine

## 2012-12-03 ENCOUNTER — Telehealth: Payer: Self-pay | Admitting: Internal Medicine

## 2012-12-03 NOTE — Telephone Encounter (Signed)
error 

## 2013-03-20 ENCOUNTER — Other Ambulatory Visit: Payer: Self-pay | Admitting: Internal Medicine

## 2013-03-25 ENCOUNTER — Telehealth: Payer: Self-pay | Admitting: Internal Medicine

## 2013-03-25 NOTE — Telephone Encounter (Signed)
Xanax .25 mg, #60, 1 po bid prn IBS symptoms., 1 refill.

## 2013-03-25 NOTE — Telephone Encounter (Signed)
Amber Bishop that patient was given #60 with 3 refills on 11/30/12. Therefore, she should have enough until 03/31/13 since she should be taking MAX 2 tablets daily. He verbalizes understanding.

## 2013-04-29 ENCOUNTER — Other Ambulatory Visit: Payer: Self-pay | Admitting: Internal Medicine

## 2013-05-25 ENCOUNTER — Other Ambulatory Visit: Payer: Self-pay | Admitting: General Surgery

## 2013-05-25 DIAGNOSIS — I77810 Thoracic aortic ectasia: Secondary | ICD-10-CM

## 2013-05-26 ENCOUNTER — Telehealth: Payer: Self-pay | Admitting: Cardiology

## 2013-05-26 ENCOUNTER — Ambulatory Visit (HOSPITAL_COMMUNITY): Payer: Medicare Other | Attending: Cardiology | Admitting: Radiology

## 2013-05-26 DIAGNOSIS — Q231 Congenital insufficiency of aortic valve: Secondary | ICD-10-CM | POA: Insufficient documentation

## 2013-05-26 DIAGNOSIS — E119 Type 2 diabetes mellitus without complications: Secondary | ICD-10-CM | POA: Insufficient documentation

## 2013-05-26 DIAGNOSIS — I10 Essential (primary) hypertension: Secondary | ICD-10-CM | POA: Insufficient documentation

## 2013-05-26 DIAGNOSIS — E669 Obesity, unspecified: Secondary | ICD-10-CM | POA: Insufficient documentation

## 2013-05-26 DIAGNOSIS — Z6841 Body Mass Index (BMI) 40.0 and over, adult: Secondary | ICD-10-CM | POA: Insufficient documentation

## 2013-05-26 DIAGNOSIS — I77810 Thoracic aortic ectasia: Secondary | ICD-10-CM

## 2013-05-26 NOTE — Progress Notes (Signed)
Echocardiogram performed.  

## 2013-05-26 NOTE — Telephone Encounter (Signed)
Please find out who ordered this echo - I do not see that I have seen her in the office before

## 2013-05-27 ENCOUNTER — Ambulatory Visit (INDEPENDENT_AMBULATORY_CARE_PROVIDER_SITE_OTHER): Payer: Medicare Other | Admitting: Internal Medicine

## 2013-05-27 ENCOUNTER — Encounter: Payer: Self-pay | Admitting: Internal Medicine

## 2013-05-27 VITALS — BP 160/88 | HR 76 | Ht 64.5 in | Wt 245.1 lb

## 2013-05-27 DIAGNOSIS — K589 Irritable bowel syndrome without diarrhea: Secondary | ICD-10-CM

## 2013-05-27 MED ORDER — ALPRAZOLAM 0.5 MG PO TABS: ORAL_TABLET | ORAL | Status: DC

## 2013-05-27 NOTE — Telephone Encounter (Signed)
Recall put in for pt.

## 2013-05-27 NOTE — Patient Instructions (Signed)
We have sent the following medications to your pharmacy for you to pick up at your convenience: Xanax 0.5 mg-Take 1 tablet by mouth every morning and 1/2 tablet by mouth at lunch  Please follow up with Dr Olevia Perches in 1 year.  CC: Dr Harlan Stains

## 2013-05-27 NOTE — Telephone Encounter (Signed)
Please have patient followup with me in 1 year to follow her bicuspid AV

## 2013-05-27 NOTE — Progress Notes (Signed)
Amber Bishop 1950/06/30 [id number]   History of Present Illness:  This is a 63 year old white female with  severe irritable bowel syndrome with predominant diarrhea and anxiety disorder causing her to stutter. She has been markedly improved on the Xanax 0.25 mg twice a day which controls her bowel habits. She denies diarrhea, constipation or abdominal pain. She stopped taking Questran on a regular basis. Her last colonoscopy in October 2012 showed moderately severe diverticulosis. A prior colonoscopy in July 2004 showed 2 hyperplastic polyps. She is due for a recall colonoscopy in 10 years. She needs refills on Xanax. She feels that she needs stronger Xanax in the mornings. She has been going through divorce.    Past Medical History  Diagnosis Date  . Anxiety   . Diabetes mellitus   . Hypertension   . IBS (irritable bowel syndrome)   . Hyperplastic colon polyp   . Allergy   . Anemia   . Arthritis   . Diverticulosis     Past Surgical History  Procedure Laterality Date  . Tubal ligation    . Colonoscopy    . Polypectomy    . Tooth extraction      3 teeth  . Belpharoptosis repair      No Known Allergies  Family history and social history have been reviewed.  Review of Systems:  Denies abdominal pain diarrhea or rectal bleeding  The remainder of the 10 point ROS is negative except as outlined in the H&P  Physical Exam: General Appearance Well developed, in no distress, obese. Eyes  Non icteric  HEENT  Non traumatic, normocephalic  Mouth No lesion, tongue papillated, no cheilosis Neck Supple without adenopathy, thyroid not enlarged, no carotid bruits, no JVD Lungs Clear to auscultation bilaterally COR Normal S1, normal S2, regular rhythm, no murmur, quiet precordium Abdomen protuberant soft abdomen with normal active bowel sounds. Nontender. Liver edge at costal margin. Rectal not done Extremities  No pedal edema Skin No lesions Neurological Alert and oriented x  3 Psychological Normal mood and affect  Assessment and Plan:   Problem #28 63 year old white female with severe anxiety and irritable bowel syndrome resulting in diarrhea. She is under good control with Xanax 0.25 mg twice a day. We will increase it to 0.5 mg in the morning and 0.25 mg in the afternoon. On the days that she has less stress, she will use only 0.25 mg. I will see her in one year.    [name] 05/27/2013

## 2013-07-01 ENCOUNTER — Other Ambulatory Visit (HOSPITAL_COMMUNITY)
Admission: RE | Admit: 2013-07-01 | Discharge: 2013-07-01 | Disposition: A | Discharge status: Home / Self Care | Payer: Medicare Other | Source: Ambulatory Visit | Attending: Family Medicine | Admitting: Family Medicine

## 2013-07-01 ENCOUNTER — Other Ambulatory Visit: Payer: Self-pay | Admitting: Family Medicine

## 2013-07-01 DIAGNOSIS — Z124 Encounter for screening for malignant neoplasm of cervix: Secondary | ICD-10-CM | POA: Insufficient documentation

## 2013-07-10 ENCOUNTER — Other Ambulatory Visit: Payer: Self-pay | Admitting: Family Medicine

## 2013-07-10 DIAGNOSIS — E2839 Other primary ovarian failure: Secondary | ICD-10-CM

## 2013-07-28 ENCOUNTER — Other Ambulatory Visit: Payer: Self-pay

## 2013-07-28 ENCOUNTER — Ambulatory Visit
Admission: RE | Admit: 2013-07-28 | Discharge: 2013-07-28 | Disposition: A | Discharge status: Home / Self Care | Payer: Medicare Other | Source: Ambulatory Visit | Attending: Family Medicine | Admitting: Family Medicine

## 2013-07-28 DIAGNOSIS — E2839 Other primary ovarian failure: Secondary | ICD-10-CM

## 2013-07-28 DIAGNOSIS — Z1231 Encounter for screening mammogram for malignant neoplasm of breast: Secondary | ICD-10-CM

## 2013-10-15 ENCOUNTER — Encounter (INDEPENDENT_AMBULATORY_CARE_PROVIDER_SITE_OTHER): Payer: Self-pay

## 2013-10-15 ENCOUNTER — Ambulatory Visit
Admission: RE | Admit: 2013-10-15 | Discharge: 2013-10-15 | Disposition: A | Discharge status: Home / Self Care | Payer: Medicare Other | Source: Ambulatory Visit

## 2013-10-15 DIAGNOSIS — Z1231 Encounter for screening mammogram for malignant neoplasm of breast: Secondary | ICD-10-CM

## 2013-12-09 ENCOUNTER — Other Ambulatory Visit: Payer: Self-pay | Admitting: Internal Medicine

## 2014-01-18 ENCOUNTER — Ambulatory Visit
Admission: RE | Admit: 2014-01-18 | Discharge: 2014-01-18 | Disposition: A | Discharge status: Home / Self Care | Payer: Medicare Other | Source: Ambulatory Visit | Attending: Family Medicine | Admitting: Family Medicine

## 2014-01-18 ENCOUNTER — Other Ambulatory Visit: Payer: Self-pay | Admitting: Family Medicine

## 2014-01-18 DIAGNOSIS — S40012A Contusion of left shoulder, initial encounter: Secondary | ICD-10-CM

## 2014-03-04 ENCOUNTER — Other Ambulatory Visit: Payer: Self-pay | Admitting: Internal Medicine

## 2014-03-18 ENCOUNTER — Encounter: Payer: Self-pay | Admitting: Internal Medicine

## 2014-03-18 ENCOUNTER — Ambulatory Visit (INDEPENDENT_AMBULATORY_CARE_PROVIDER_SITE_OTHER): Payer: Medicare Other | Admitting: Internal Medicine

## 2014-03-18 ENCOUNTER — Other Ambulatory Visit: Payer: Self-pay | Admitting: *Deleted

## 2014-03-18 VITALS — BP 124/74 | HR 76 | Temp 98.5°F | Resp 12 | Ht 64.5 in | Wt 227.0 lb

## 2014-03-18 DIAGNOSIS — IMO0001 Reserved for inherently not codable concepts without codable children: Secondary | ICD-10-CM

## 2014-03-18 DIAGNOSIS — E1165 Type 2 diabetes mellitus with hyperglycemia: Secondary | ICD-10-CM

## 2014-03-18 MED ORDER — INSULIN ASPART 100 UNIT/ML FLEXPEN: PEN_INJECTOR | SUBCUTANEOUS | Status: DC

## 2014-03-18 MED ORDER — INSULIN DETEMIR 100 UNIT/ML FLEXPEN: 35.0000 [IU] | PEN_INJECTOR | Freq: Every day | SUBCUTANEOUS | Status: DC

## 2014-03-18 MED ORDER — INSULIN PEN NEEDLE 31G X 8 MM MISC: Status: AC

## 2014-03-18 NOTE — Patient Instructions (Signed)
Please stop Actos. Please increase Levemir to 35 units at bedtime. Start NovoLog: 8 units for a smaller meal 10 units for a regular meal 12 units for a large meal Please return in 1 month with your sugar log.   PATIENT INSTRUCTIONS FOR TYPE 2 DIABETES:  **Please join MyChart!** - see attached instructions about how to join if you have not done so already.  DIET AND EXERCISE Diet and exercise is an important part of diabetic treatment.  We recommended aerobic exercise in the form of brisk walking (working between 40-60% of maximal aerobic capacity, similar to brisk walking) for 150 minutes per week (such as 30 minutes five days per week) along with 3 times per week performing 'resistance' training (using various gauge rubber tubes with handles) 5-10 exercises involving the major muscle groups (upper body, lower body and core) performing 10-15 repetitions (or near fatigue) each exercise. Start at half the above goal but build slowly to reach the above goals. If limited by weight, joint pain, or disability, we recommend daily walking in a swimming pool with water up to waist to reduce pressure from joints while allow for adequate exercise.    BLOOD GLUCOSES Monitoring your blood glucoses is important for continued management of your diabetes. Please check your blood glucoses 2-4 times a day: fasting, before meals and at bedtime (you can rotate these measurements - e.g. one day check before the 3 meals, the next day check before 2 of the meals and before bedtime, etc.).   HYPOGLYCEMIA (low blood sugar) Hypoglycemia is usually a reaction to not eating, exercising, or taking too much insulin/ other diabetes drugs.  Symptoms include tremors, sweating, hunger, confusion, headache, etc. Treat IMMEDIATELY with 15 grams of Carbs:   4 glucose tablets    cup regular juice/soda   2 tablespoons raisins   4 teaspoons sugar   1 tablespoon honey Recheck blood glucose in 15 mins and repeat above if still  symptomatic/blood glucose <100.  RECOMMENDATIONS TO REDUCE YOUR RISK OF DIABETIC COMPLICATIONS: * Take your prescribed MEDICATION(S) * Follow a DIABETIC diet: Complex carbs, fiber rich foods, (monounsaturated and polyunsaturated) fats * AVOID saturated/trans fats, high fat foods, >2,300 mg salt per day. * EXERCISE at least 5 times a week for 30 minutes or preferably daily.  * DO NOT SMOKE OR DRINK more than 1 drink a day. * Check your FEET every day. Do not wear tightfitting shoes. Contact us if you develop an ulcer * See your EYE doctor once a year or more if needed * Get a FLU shot once a year * Get a PNEUMONIA vaccine once before and once after age 13 years  GOALS:  * Your Hemoglobin A1c of <7%  * fasting sugars need to be <130 * after meals sugars need to be <180 (2h after you start eating) * Your Systolic BP should be 250 or lower  * Your Diastolic BP should be 80 or lower  * Your HDL (Good Cholesterol) should be 40 or higher  * Your LDL (Bad Cholesterol) should be 100 or lower. * Your Triglycerides should be 150 or lower  * Your Urine microalbumin (kidney function) should be <30 * Your Body Mass Index should be 25 or lower    Please consider the following ways to cut down carbs and fat and increase fiber and micronutrients in your diet: - substitute whole grain for white bread or pasta - substitute brown rice for white rice - substitute 90-calorie flat bread pieces for slices  of bread when possible - substitute sweet potatoes or yams for white potatoes - substitute humus for margarine - substitute tofu for cheese when possible - substitute almond or rice milk for regular milk (would not drink soy milk daily due to concern for soy estrogen influence on breast cancer risk) - substitute dark chocolate for other sweets when possible - substitute water - can add lemon or orange slices for taste - for diet sodas (artificial sweeteners will trick your body that you can eat sweets  without getting calories and will lead you to overeating and weight gain in the long run) - do not skip breakfast or other meals (this will slow down the metabolism and will result in more weight gain over time)  - can try smoothies made from fruit and almond/rice milk in am instead of regular breakfast - can also try old-fashioned (not instant) oatmeal made with almond/rice milk in am - order the dressing on the side when eating salad at a restaurant (pour less than half of the dressing on the salad) - eat as little meat as possible - can try juicing, but should not forget that juicing will get rid of the fiber, so would alternate with eating raw veg./fruits or drinking smoothies - use as little oil as possible, even when using olive oil - can dress a salad with a mix of balsamic vinegar and lemon juice, for e.g. - use agave nectar, stevia sugar, or regular sugar rather than artificial sweateners - steam or broil/roast veggies  - snack on veggies/fruit/nuts (unsalted, preferably) when possible, rather than processed foods - reduce or eliminate aspartame in diet (it is in diet sodas, chewing gum, etc) Read the labels!  Try to read Dr. Janene Harvey book: "Program for Reversing Diabetes" for other ideas for healthy eating.

## 2014-03-18 NOTE — Progress Notes (Signed)
Patient ID: Amber Bishop, female   DOB: Jan 24, 1951, 63 y.o.   MRN: [id number]  HPI: Amber Bishop is a 63 y.o.-year-old female, referred by her PCP, Dr. Dema Severin for management of DM2, insulin-dependent, uncontrolled, without complications. She is here with her fiancee who offers part of the history.  Patient has been diagnosed with diabetes in 1991; she started insulin 08/2013 after her latest steroid inj.   Last hemoglobin A1c was: 01/04/2014: 8%  Pt is on a regimen of: - Amaryl 2 mg 2x a day - Actos 30 mg daily - Levemir 32 units at bedtime She was on Metformin before >> diarrhea, stomach cramps (IBS)  Pt checks her sugars 4x a day and they are: - am: 164-244 - 2h after b'fast: n/c - before lunch: 228-400 - 2h after lunch: n/c - before dinner: 217-401 - 2h after dinner: see below - bedtime: 300-400s No lows. Lowest sugar was 164; she has hypoglycemia awareness at 70.  Highest sugar was 400s.  Pt's meals are: - Breakfast:  Eggs, fruit (pinneapple) - Lunch: 1/2 sandwich chicken, fish, vegetables - Dinner: chicken + vegetables, yoghurt - Snacks: 1 in pm  [facility]   She had DM classes in the past.  - no CKD. On Valsartan. - no available lipid levels - last eye exam was in 05/2013. No DR. + cataracts. - no numbness and tingling in her feet. Sees a podiatrist, last visit 03/02/2014. Also saw Dr March Rummage (DM foot dr >> Rx'ed DM shoes and inserts)  Pt has no FH of DM.  ROS: Constitutional: + both weight gain/loss, +  fatigue, no subjective hyperthermia/hypothermia, + nocturia Eyes: no blurry vision, no xerophthalmia ENT: no sore throat, no nodules palpated in throat, no dysphagia/odynophagia, no hoarseness Cardiovascular: no CP/SOB/palpitations/leg swelling Respiratory: + cough/no SOB Gastrointestinal: no N/V/+ D/+ C/+ acid reflux Musculoskeletal: + both: muscle/joint aches Skin: no rashes, + easy bruising, + itching Neurological: no  tremors/numbness/tingling/dizziness, + HA Psychiatric: + depression/+ anxiety  Past Medical History  Diagnosis Date  . Anxiety   . Diabetes mellitus   . Hypertension   . IBS (irritable bowel syndrome)   . Hyperplastic colon polyp   . Allergy   . Anemia   . Arthritis   . Diverticulosis    Past Surgical History  Procedure Laterality Date  . Tubal ligation    . Colonoscopy    . Polypectomy    . Tooth extraction      3 teeth  . Belpharoptosis repair     History   Social History  . Marital Status: divorced    Spouse Name: N/A    Number of Children: 1   Occupational History  . Retired    Social History Main Topics  . Smoking status: Never Smoker   . Smokeless tobacco: Never Used  . Alcohol Use: No  . Drug Use: No   Current Outpatient Prescriptions on File Prior to Visit  Medication Sig Dispense Refill  . ALPRAZolam (XANAX) 0.5 MG tablet TAKE 1 TABLET BY MOUTH IN THE MORNING AND 1/2 TABLET AT LUNCH DAILY  60 tablet  1  . aspirin 81 MG tablet Take 81 mg by mouth daily.        . Calcium Carbonate-Vitamin D (CALCIUM 600/VITAMIN D) 600-400 MG-UNIT per tablet Take 2 tablets by mouth daily.       . cholestyramine (QUESTRAN) 4 G packet Take 1/2-1 packet dissolved in water/juice once daily....................Marland KitchenPRN  90 each  3  . FLUoxetine (  PROZAC) 20 MG capsule Take 20 mg by mouth daily.        . Garlic Oil 2878 MG CAPS Take 1 capsule by mouth daily.        Marland Kitchen glimepiride (AMARYL) 2 MG tablet Take 2 mg by mouth 2 (two) times daily.      Marland Kitchen loratadine (CLARITIN) 10 MG tablet Take 10 mg by mouth daily as needed for allergies.      . meloxicam (MOBIC) 15 MG tablet Take 15 mg by mouth at bedtime.        . Multiple Vitamins-Minerals (CENTRUM SILVER PO) Take 1 capsule by mouth daily.        . Multiple Vitamins-Minerals (ICAPS MV PO) Take 2 capsules by mouth daily.       . NON FORMULARY Over the counter Mucinex ([employer] brand) as needed       . Omega-3 Fatty Acids (FISH OIL) 1000 MG  CAPS Take 1 capsule by mouth daily.      . pioglitazone (ACTOS) 30 MG tablet Take 30 mg by mouth daily.      Marland Kitchen ibuprofen (ADVIL,MOTRIN) 200 MG tablet Take 200 mg by mouth every 6 (six) hours as needed.        Marland Kitchen losartan-hydrochlorothiazide (HYZAAR) 100-12.5 MG per tablet Take 1 tablet by mouth daily.        No current facility-administered medications on file prior to visit.   Allergies  Allergen Reactions  . Metformin And Related Diarrhea    Pt has IBS    Family History  Problem Relation Age of Onset  . Colon cancer Neg Hx   . Esophageal cancer Neg Hx   . Stomach cancer Neg Hx    PE: BP 124/74  Pulse 76  Temp(Src) 98.5 F (36.9 C) (Oral)  Resp 12  Ht 5' 4.5" (1.638 m)  Wt 227 lb (102.967 kg)  BMI 38.38 kg/m2  SpO2 97% Wt Readings from Last 3 Encounters:  03/18/14 227 lb (102.967 kg)  05/27/13 245 lb 2 oz (111.188 kg)  06/11/12 216 lb 6.4 oz (98.158 kg)   Constitutional: overweight, in NAD Eyes: PERRLA, EOMI, no exophthalmos ENT: moist mucous membranes, no thyromegaly, no cervical lymphadenopathy Cardiovascular: RRR, No MRG Respiratory: CTA B Gastrointestinal: abdomen soft, NT, ND, BS+ Musculoskeletal: no deformities, strength intact in all 4 Skin: moist, warm, no rashes, scars on arms (healed) Neurological: no tremor with outstretched hands, DTR normal in all 4  ASSESSMENT: 1. DM2, insulin-dependent, uncontrolled, without complications  PLAN:  1. Patient with long-standing, uncontrolled diabetes, on oral antidiabetic regimen + mealtime insulin, which became insufficient. - We discussed about options for treatment, and I suggested to start mealtime insulin. For now will keep Amaryl.  Patient Instructions  Please stop Actos. Please increase Levemir to 35 units at bedtime. Start NovoLog: 8 units for a smaller meal 10 units for a regular meal 12 units for a large meal Please return in 1 month with your sugar log.  - continue checking sugars at different times of  the day - check 3-4 times a day, rotating checks - given sugar log and advised how to fill it and to bring it at next appt  - given foot Bishop handout and explained the principles  - given instructions for hypoglycemia management "15-15 rule"  - advised for yearly eye exams - Return to clinic in 1 mo with sugar log - will may need a Lipid panel and BMP then. We will also contact Dr Dema Severin to see if  we can get her latest lab results.

## 2014-03-25 ENCOUNTER — Telehealth: Payer: Self-pay | Admitting: Internal Medicine

## 2014-03-25 NOTE — Telephone Encounter (Signed)
Please read note below and advise.  

## 2014-03-25 NOTE — Telephone Encounter (Signed)
Yes, it would be better if she can fast as I plan to check her cholesterol.

## 2014-03-25 NOTE — Telephone Encounter (Signed)
Patient want to know if she need if have to fast for her appt in Dec.

## 2014-03-26 NOTE — Telephone Encounter (Signed)
Called and spoke with pt and she is aware.  Pt states she is afraid she may pass out and would like to have an earlier appointment.  Pt transferred to make an earlier appt.

## 2014-04-21 ENCOUNTER — Ambulatory Visit: Payer: Medicare Other | Admitting: Internal Medicine

## 2014-04-26 ENCOUNTER — Encounter: Payer: Self-pay | Admitting: Cardiology

## 2014-05-05 ENCOUNTER — Ambulatory Visit (INDEPENDENT_AMBULATORY_CARE_PROVIDER_SITE_OTHER): Payer: Medicare Other | Admitting: Internal Medicine

## 2014-05-05 ENCOUNTER — Encounter: Payer: Self-pay | Admitting: Internal Medicine

## 2014-05-05 VITALS — BP 124/68 | HR 58 | Temp 98.4°F | Resp 12 | Wt 226.0 lb

## 2014-05-05 DIAGNOSIS — IMO0001 Reserved for inherently not codable concepts without codable children: Secondary | ICD-10-CM

## 2014-05-05 DIAGNOSIS — E1165 Type 2 diabetes mellitus with hyperglycemia: Secondary | ICD-10-CM

## 2014-05-05 LAB — COMPREHENSIVE METABOLIC PANEL
ALK PHOS: 68 U/L (ref 39–117)
ALT: 25 U/L (ref 0–35)
AST: 18 U/L (ref 0–37)
Albumin: 3.8 g/dL (ref 3.5–5.2)
BILIRUBIN TOTAL: 0.5 mg/dL (ref 0.2–1.2)
BUN: 13 mg/dL (ref 6–23)
CO2: 28 meq/L (ref 19–32)
Calcium: 8.8 mg/dL (ref 8.4–10.5)
Chloride: 105 mEq/L (ref 96–112)
Creatinine, Ser: 0.8 mg/dL (ref 0.4–1.2)
GFR: 79.91 mL/min (ref >60.00)
Glucose, Bld: 245 mg/dL — ABNORMAL HIGH (ref 70–99)
Potassium: 3.6 mEq/L (ref 3.5–5.1)
Sodium: 138 mEq/L (ref 135–145)
TOTAL PROTEIN: 6.4 g/dL (ref 6.0–8.3)

## 2014-05-05 LAB — LIPID PANEL
CHOL/HDL RATIO: 2
Cholesterol: 121 mg/dL (ref 0–200)
HDL: 52.4 mg/dL (ref >39.00)
LDL CALC: 54 mg/dL (ref 0–99)
NONHDL: 68.6
Triglycerides: 72 mg/dL (ref 0.0–149.0)
VLDL: 14.4 mg/dL (ref 0.0–40.0)

## 2014-05-05 LAB — HEMOGLOBIN A1C: HEMOGLOBIN A1C: 10.1 % — AB (ref 4.6–6.5)

## 2014-05-05 MED ORDER — INSULIN ASPART 100 UNIT/ML FLEXPEN: PEN_INJECTOR | SUBCUTANEOUS | Status: DC

## 2014-05-05 NOTE — Progress Notes (Signed)
Patient ID: Amber Bishop, female   DOB: Dec 13, 1950, 63 y.o.   MRN: [id number]  HPI: Amber Bishop is a 63 y.o.-year-old female, returning for f/u for DM2, dx 1991, insulin-dependent since 08/2013 after steroid inj., uncontrolled, without complications. She is here with her fiancee who offers part of the history. Last visit 65 mo ago.  She just finished PT for shoulder traumatic injury (which she had in 12/2013).   She then had a sinus inf since last visit >> ABx >> better.  Last hemoglobin A1c was: 01/04/2014: 8%  Pt was on a regimen of: - Amaryl 2 mg 2x a day - Actos 30 mg daily - Levemir 32 units at bedtime She was on Metformin before >> diarrhea, stomach cramps (IBS)  She is now on: - Levemir to 35 units at bedtime. - NovoLog: 8 units for a smaller meal 10 units for a regular meal 12 units for a large meal  Pt checks her sugars 4x a day and they are better, but still high, except after exercise: - am: 164-244 >> 147-244 - 2h after b'fast: n/c - before lunch: 228-400 >> 72-180, few 200s - 2h after lunch: n/c - before dinner: 217-401 >> 90-250, 382 - 2h after dinner: see below - bedtime: 300-400s >> 173-358 No lows. Lowest sugar was 72; she has hypoglycemia awareness at 70.  Highest sugar was 400 x1.  Pt's meals are: - Breakfast:  Eggs, fruit (pinneapple) - Lunch: 1/2 sandwich chicken, fish, vegetables - Dinner: chicken + vegetables, yoghurt - Snacks: 1 in pm  [facility]  She had DM classes in the past.  - no CKD. On Valsartan. - lipid levels not available for review. - last eye exam was in 05/2013. No DR. + cataracts. - no numbness and tingling in her feet. Sees a podiatrist, last visit 03/02/2014. Also saw Dr March Rummage (DM foot dr >> Rx'ed DM shoes and inserts)  ROS: Constitutional: no weight gain/loss, no fatigue, no subjective hyperthermia/hypothermia Eyes: no blurry vision, no xerophthalmia ENT: no sore throat, no nodules palpated  in throat, no dysphagia/odynophagia, no hoarseness Cardiovascular: no CP/SOB/palpitations/leg swelling Respiratory: no cough/SOB Gastrointestinal: no N/V/D/C Musculoskeletal: no muscle/joint aches Skin: no rashes Neurological: no tremors/numbness/tingling/dizziness  I reviewed pt's medications, allergies, PMH, social hx, family hx - Fluoxetine changed to capsules.   Past Medical History  Diagnosis Date  . Anxiety   . Diabetes mellitus   . Hypertension   . IBS (irritable bowel syndrome)   . Hyperplastic colon polyp   . Allergy   . Anemia   . Arthritis   . Diverticulosis    Past Surgical History  Procedure Laterality Date  . Tubal ligation    . Colonoscopy    . Polypectomy    . Tooth extraction      3 teeth  . Belpharoptosis repair     History   Social History  . Marital Status: divorced    Spouse Name: N/A    Number of Children: 1   Occupational History  . Retired    Social History Main Topics  . Smoking status: Never Smoker   . Smokeless tobacco: Never Used  . Alcohol Use: No  . Drug Use: No   Current Outpatient Prescriptions on File Prior to Visit  Medication Sig Dispense Refill  . ACCU-CHEK AVIVA PLUS test strip 1 each by Other route as needed. Test 4 times daily    . ALPRAZolam (XANAX) 0.5 MG tablet TAKE 1  TABLET BY MOUTH IN THE MORNING AND 1/2 TABLET AT LUNCH DAILY 60 tablet 1  . amLODipine (NORVASC) 5 MG tablet Take 5 mg by mouth daily.     Marland Kitchen aspirin 81 MG tablet Take 81 mg by mouth daily.      . Calcium Carbonate-Vitamin D (CALCIUM 600/VITAMIN D) 600-400 MG-UNIT per tablet Take 2 tablets by mouth daily.     . cholestyramine (QUESTRAN) 4 G packet Take 1/2-1 packet dissolved in water/juice once daily....................Marland KitchenPRN 90 each 3  . FLUoxetine (PROZAC) 20 MG capsule Take 20 mg by mouth daily.      . Garlic Oil 5329 MG CAPS Take 1 capsule by mouth daily.      Marland Kitchen glimepiride (AMARYL) 2 MG tablet Take 2 mg by mouth 2 (two) times daily.    Marland Kitchen ibuprofen  (ADVIL,MOTRIN) 200 MG tablet Take 200 mg by mouth every 6 (six) hours as needed.      . insulin aspart (NOVOLOG FLEXPEN) 100 UNIT/ML FlexPen Inject 8-12 units into the skin 3 times daily as instructed. 15 mL 2  . Insulin Detemir (LEVEMIR FLEXTOUCH) 100 UNIT/ML Pen Inject 35 Units into the skin daily at 10 pm. 15 mL 2  . Insulin Pen Needle (VALUMARK PEN NEEDLES) 31G X 8 MM MISC Use to inject insulin 4 times daily as instructed. 150 each 3  . loratadine (CLARITIN) 10 MG tablet Take 10 mg by mouth daily as needed for allergies.    Marland Kitchen losartan-hydrochlorothiazide (HYZAAR) 100-12.5 MG per tablet Take 1 tablet by mouth daily.     . meloxicam (MOBIC) 15 MG tablet Take 15 mg by mouth at bedtime.      . Multiple Vitamins-Minerals (CENTRUM SILVER PO) Take 1 capsule by mouth daily.      . Multiple Vitamins-Minerals (ICAPS MV PO) Take 2 capsules by mouth daily.     . NON FORMULARY Over the counter Mucinex ([facility] brand) as needed     . Omega-3 Fatty Acids (FISH OIL) 1000 MG CAPS Take 1 capsule by mouth daily.    . pioglitazone (ACTOS) 30 MG tablet Take 30 mg by mouth daily.    . traMADol (ULTRAM) 50 MG tablet Take 50 mg by mouth as needed.     . valsartan-hydrochlorothiazide (DIOVAN-HCT) 320-12.5 MG per tablet      No current facility-administered medications on file prior to visit.   Allergies  Allergen Reactions  . Metformin And Related Diarrhea    Pt has IBS    Family History  Problem Relation Age of Onset  . Colon cancer Neg Hx   . Esophageal cancer Neg Hx   . Stomach cancer Neg Hx    PE: BP 124/68 mmHg  Pulse 58  Temp(Src) 98.4 F (36.9 C) (Oral)  Resp 12  SpO2 98% There is no weight on file to calculate BMI.  Wt Readings from Last 3 Encounters:  03/18/14 227 lb (102.967 kg)  05/27/13 245 lb 2 oz (111.188 kg)  06/11/12 216 lb 6.4 oz (98.158 kg)   Constitutional: overweight, in NAD Eyes: PERRLA, EOMI, no exophthalmos ENT: moist mucous membranes, no thyromegaly, no cervical  lymphadenopathy Cardiovascular: RRR, No MRG Respiratory: CTA B Gastrointestinal: abdomen soft, NT, ND, BS+ Musculoskeletal: no deformities, strength intact in all 4 Skin: moist, warm, no rashes, scars on arms (healed) Neurological: no tremor with outstretched hands, DTR normal in all 4  ASSESSMENT: 1. DM2, insulin-dependent, uncontrolled, without complications  PLAN:  1. Patient with long-standing, uncontrolled diabetes, on oral antidiabetic regimen + mealtime  insulin, with better control after we added mealtime insulin at last visit, but sugars still high >> will increase mealtime insulin as follows: Patient Instructions  Please continue: - Levemir to 35 units at bedtime. Please increase: - NovoLog: 12 units for a smaller meal 14 units for a regular meal 16 units for a large meal Please take no more than 10 units of insulin with a meal if you plan to exercise after that meal. Please stop at the lab. Please return in 1.5 month with your sugar log.   - continue checking sugars at different times of the day - check 3-4 times a day, rotating checks - advised for yearly eye exams >> she is UTD - will check a Lipid panel, CMP and HbA1c today  - Return to clinic in 1.5 mo with sugar log  Office Visit on 05/05/2014  Component Date Value Ref Range Status  . Sodium 05/05/2014 138  135 - 145 mEq/L Final  . Potassium 05/05/2014 3.6  3.5 - 5.1 mEq/L Final  . Chloride 05/05/2014 105  96 - 112 mEq/L Final  . CO2 05/05/2014 28  19 - 32 mEq/L Final  . Glucose, Bld 05/05/2014 245* 70 - 99 mg/dL Final  . BUN 05/05/2014 13  6 - 23 mg/dL Final  . Creatinine, Ser 05/05/2014 0.8  0.4 - 1.2 mg/dL Final  . Total Bilirubin 05/05/2014 0.5  0.2 - 1.2 mg/dL Final  . Alkaline Phosphatase 05/05/2014 68  39 - 117 U/L Final  . AST 05/05/2014 18  0 - 37 U/L Final  . ALT 05/05/2014 25  0 - 35 U/L Final  . Total Protein 05/05/2014 6.4  6.0 - 8.3 g/dL Final  . Albumin 05/05/2014 3.8  3.5 - 5.2 g/dL Final  .  Calcium 05/05/2014 8.8  8.4 - 10.5 mg/dL Final  . GFR 05/05/2014 79.91  >60.00 mL/min Final  . Cholesterol 05/05/2014 121  0 - 200 mg/dL Final   ATP III Classification       Desirable:  < 200 mg/dL               Borderline High:  200 - 239 mg/dL          High:  > = 240 mg/dL  . Triglycerides 05/05/2014 72.0  0.0 - 149.0 mg/dL Final   Normal:  <150 mg/dLBorderline High:  150 - 199 mg/dL  . HDL 05/05/2014 52.40  >39.00 mg/dL Final  . VLDL 05/05/2014 14.4  0.0 - 40.0 mg/dL Final  . LDL Cholesterol 05/05/2014 54  0 - 99 mg/dL Final  . Total CHOL/HDL Ratio 05/05/2014 2   Final                  Men          Women1/2 Average Risk     3.4          3.3Average Risk          5.0          4.42X Average Risk          9.6          7.13X Average Risk          15.0          11.0                      . NonHDL 05/05/2014 68.60   Final   NOTE:  Non-HDL goal should be 30 mg/dL higher than  patient's LDL goal (i.e. LDL goal of < 70 mg/dL, would have non-HDL goal of < 100 mg/dL)  . Hgb A1c MFr Bld 05/05/2014 10.1* 4.6 - 6.5 % Final   Glycemic Control Guidelines for People with Diabetes:Non Diabetic:  <6%Goal of Therapy: <7%Additional Action Suggested:  >8%

## 2014-05-05 NOTE — Patient Instructions (Signed)
Please continue: - Levemir to 35 units at bedtime. Please increase: - NovoLog: 12 units for a smaller meal 14 units for a regular meal 16 units for a large meal Please take no more than 10 units of insulin with a meal if you plan to exercise after that meal. Please stop at the lab. Please return in 1.5 month with your sugar log.

## 2014-05-27 ENCOUNTER — Ambulatory Visit (INDEPENDENT_AMBULATORY_CARE_PROVIDER_SITE_OTHER): Payer: PPO | Admitting: Cardiology

## 2014-05-27 ENCOUNTER — Encounter: Payer: Self-pay | Admitting: Cardiology

## 2014-05-27 VITALS — BP 120/82 | HR 74 | Ht 64.0 in | Wt 228.0 lb

## 2014-05-27 DIAGNOSIS — Q231 Congenital insufficiency of aortic valve: Secondary | ICD-10-CM | POA: Insufficient documentation

## 2014-05-27 DIAGNOSIS — I77810 Thoracic aortic ectasia: Secondary | ICD-10-CM | POA: Insufficient documentation

## 2014-05-27 DIAGNOSIS — I10 Essential (primary) hypertension: Secondary | ICD-10-CM

## 2014-05-27 DIAGNOSIS — I517 Cardiomegaly: Secondary | ICD-10-CM | POA: Insufficient documentation

## 2014-05-27 NOTE — Patient Instructions (Signed)
Your physician recommends that you continue on your current medications as directed. Please refer to the Current Medication list given to you today.  Your physician has requested that you have an echocardiogram. Echocardiography is a painless test that uses sound waves to create images of your heart. It provides your doctor with information about the size and shape of your heart and how well your heart's chambers and valves are working. This procedure takes approximately one hour. There are no restrictions for this procedure.  Your physician wants you to follow-up in: 1 year with Dr. Turner.  You will receive a reminder letter in the mail two months in advance. If you don't receive a letter, please call our office to schedule the follow-up appointment.  

## 2014-05-27 NOTE — Progress Notes (Signed)
[facility], [place], [place]  [address] Phone: [phone] Fax:  [phone]  Date:  05/27/2014   ID:  Amber Bishop, DOB [birth year], MRN [id number]  PCP:  [name], MD  Cardiologist:  Fransico Him, MD    History of Present Illness: This is a 64yo WF with a history of dilated aortic root (normal by last echo 05/2013), mild LV dilatation with EF 50-55%, bicuspid aortic valve, HTN and DM who presents today for evaluation of bicuspid AV.  She denies any chest pain, SOB, DOE, LE edema, dizziness, palpitations or syncope.     Wt Readings from Last 3 Encounters:  05/27/14 228 lb (103.42 kg)  05/05/14 226 lb (102.513 kg)  03/18/14 227 lb (102.967 kg)     Past Medical History  Diagnosis Date  . Anxiety   . Diabetes mellitus   . Hypertension   . IBS (irritable bowel syndrome)   . Hyperplastic colon polyp   . Allergy   . Anemia   . Arthritis   . Diverticulosis   . Dilated aortic root     seen on prior echo but echo 05/2013 showed normal dimensions  . Bicuspid aortic valve   . LVE (left ventricular enlargement)     mild by echo 1/15 with EF 50-55%    Current Outpatient Prescriptions  Medication Sig Dispense Refill  . ACCU-CHEK AVIVA PLUS test strip 1 each by Other route as needed. Test 4 times daily    . ALPRAZolam (XANAX) 0.5 MG tablet TAKE 1 TABLET BY MOUTH IN THE MORNING AND 1/2 TABLET AT LUNCH DAILY 60 tablet 1  . amLODipine (NORVASC) 5 MG tablet Take 5 mg by mouth daily.     Marland Kitchen aspirin 81 MG tablet Take 81 mg by mouth daily.      . Calcium Carbonate-Vitamin D (CALCIUM 600/VITAMIN D) 600-400 MG-UNIT per tablet Take 2 tablets by mouth daily.     Marland Kitchen FLUoxetine (PROZAC) 20 MG capsule Take 20 mg by mouth daily.      Marland Kitchen glimepiride (AMARYL) 2 MG tablet Take 2 mg by mouth 2 (two) times daily.    . insulin aspart (NOVOLOG FLEXPEN) 100 UNIT/ML FlexPen Inject 10-16 units into the skin 3 times daily as instructed. (Patient taking differently: Inject 10-16 units into  the skin 3 times daily as instructed.(SLIDING SCALE)) 15 mL 2  . Insulin Detemir (LEVEMIR FLEXTOUCH) 100 UNIT/ML Pen Inject 35 Units into the skin daily at 10 pm. 15 mL 2  . Insulin Pen Needle (VALUMARK PEN NEEDLES) 31G X 8 MM MISC Use to inject insulin 4 times daily as instructed. 150 each 3  . loratadine (CLARITIN) 10 MG tablet Take 10 mg by mouth daily as needed for allergies.    . meloxicam (MOBIC) 15 MG tablet Take 15 mg by mouth at bedtime.      . Multiple Vitamins-Minerals (CENTRUM SILVER PO) Take 1 capsule by mouth daily.      . Multiple Vitamins-Minerals (ICAPS MV PO) Take 2 capsules by mouth daily.     . Omega-3 Fatty Acids (FISH OIL) 1000 MG CAPS Take 1 capsule by mouth daily.    . traMADol (ULTRAM) 50 MG tablet Take 50 mg by mouth as needed.     . valsartan-hydrochlorothiazide (DIOVAN-HCT) 320-12.5 MG per tablet     . cholestyramine (QUESTRAN) 4 G packet Take 1/2-1 packet dissolved in water/juice once daily....................Marland KitchenPRN (Patient not taking: Reported on 05/27/2014) 90 each 3  . Garlic Oil 8676 MG CAPS  Take 1 capsule by mouth daily.      Marland Kitchen ibuprofen (ADVIL,MOTRIN) 200 MG tablet Take 200 mg by mouth every 6 (six) hours as needed.       No current facility-administered medications for this visit.    Allergies:    Allergies  Allergen Reactions  . Metformin And Related Diarrhea    Pt has IBS     Social History:  The patient  reports that she has never smoked. She has never used smokeless tobacco. She reports that she does not drink alcohol or use illicit drugs.   Family History:  The patient's family history is negative for Colon cancer, Esophageal cancer, and Stomach cancer.   ROS:  Please see the history of present illness.      All other systems reviewed and negative.   PHYSICAL EXAM: VS:  BP 120/82 mmHg  Pulse 74  Ht 5\' 4"  (1.626 m)  Wt 228 lb (103.42 kg)  BMI 39.12 kg/m2 Well nourished, well developed, in no acute distress HEENT: normal Neck: no JVD Cardiac:   normal S1, S2; RRR; no murmur Lungs:  clear to auscultation bilaterally, no wheezing, rhonchi or rales Abd: soft, nontender, no hepatomegaly Ext: no edema Skin: warm and dry Neuro:  CNs 2-12 intact, no focal abnormalities noted  EKG:  NSR with nonspecific ST abnormality and septal infarct   No change from 2013  ASSESSMENT AND PLAN:  1. Bicuspid aortic valve - I will repeat an echo to evaluate 2. H/O dilated aortic root - last echo a year ago measured normal.  3. Mild LV enlargement with EF 50-55%.  Will repeat echo to make sure it is stable.  4. Type II DM 5.   HTN well controlled.  Continue amlodipine/Diovan HCT  Followup with me in 1 year   Signed, Fransico Him, MD [facility] 05/27/2014 9:48 AM

## 2014-05-31 ENCOUNTER — Telehealth: Payer: Self-pay | Admitting: Internal Medicine

## 2014-05-31 ENCOUNTER — Telehealth: Payer: Self-pay | Admitting: Endocrinology

## 2014-05-31 NOTE — Telephone Encounter (Signed)
Please see below.

## 2014-05-31 NOTE — Telephone Encounter (Signed)
Pt now uses [facility] on [address] # [phone]  Pt needs all RX resent to this [facility].

## 2014-05-31 NOTE — Telephone Encounter (Signed)
Please advise which medication needs refilled?

## 2014-06-01 MED ORDER — ALPRAZOLAM 0.5 MG PO TABS: ORAL_TABLET | ORAL | Status: DC

## 2014-06-01 NOTE — Telephone Encounter (Signed)
Rx sent 

## 2014-06-07 ENCOUNTER — Encounter: Payer: Self-pay | Admitting: Internal Medicine

## 2014-06-09 ENCOUNTER — Ambulatory Visit (HOSPITAL_COMMUNITY): Payer: PPO | Attending: Cardiology | Admitting: Radiology

## 2014-06-09 DIAGNOSIS — I77810 Thoracic aortic ectasia: Secondary | ICD-10-CM | POA: Diagnosis not present

## 2014-06-09 DIAGNOSIS — Q231 Congenital insufficiency of aortic valve: Secondary | ICD-10-CM | POA: Diagnosis not present

## 2014-06-09 DIAGNOSIS — I517 Cardiomegaly: Secondary | ICD-10-CM | POA: Diagnosis not present

## 2014-06-09 NOTE — Progress Notes (Signed)
Echocardiogram performed.  

## 2014-06-16 ENCOUNTER — Encounter: Payer: Self-pay | Admitting: Internal Medicine

## 2014-06-16 ENCOUNTER — Ambulatory Visit (INDEPENDENT_AMBULATORY_CARE_PROVIDER_SITE_OTHER): Payer: PPO | Admitting: Internal Medicine

## 2014-06-16 VITALS — BP 124/70 | HR 73 | Temp 98.6°F | Resp 12 | Wt 230.0 lb

## 2014-06-16 DIAGNOSIS — E1165 Type 2 diabetes mellitus with hyperglycemia: Secondary | ICD-10-CM

## 2014-06-16 DIAGNOSIS — IMO0001 Reserved for inherently not codable concepts without codable children: Secondary | ICD-10-CM

## 2014-06-16 MED ORDER — INSULIN DETEMIR 100 UNIT/ML FLEXPEN: 40.0000 [IU] | PEN_INJECTOR | Freq: Every day | SUBCUTANEOUS | Status: DC

## 2014-06-16 MED ORDER — INSULIN ASPART 100 UNIT/ML FLEXPEN: PEN_INJECTOR | SUBCUTANEOUS | Status: DC

## 2014-06-16 NOTE — Patient Instructions (Addendum)
Please increase: - Levemir to 40 units at bedtime. Please increase: - NovoLog: 14 units for a smaller meal 16 units for a regular meal 18 units for a large meal Please decrease the Amaryl 2 mg to dinnertime only (stop the one in the morning). Please take no more than 12 units of insulin with a meal if you plan to exercise or stay activeafter that meal. Please return in 1.5 month with your sugar log.

## 2014-06-16 NOTE — Progress Notes (Signed)
Patient ID: Amber Bishop, female   DOB: [birth year], 64 y.o.   MRN: [id number]  HPI: Amber Bishop is a 64 y.o.-year-old female, returning for f/u for DM2, dx 1991, insulin-dependent since 08/2013 after steroid inj., uncontrolled, without complications. She is here with her fiancee who offers part of the history. Last visit 66 mo ago.  Last hemoglobin A1c was: Lab Results  Component Value Date   HGBA1C 10.1* 05/05/2014  01/04/2014: 8%  Pt was on a regimen of: - Amaryl 2 mg 2x a day - Actos 30 mg daily - Levemir 32 units at bedtime She was on Metformin before >> diarrhea, stomach cramps (IBS)  She is now on: - Levemir to 35 units at bedtime. - NovoLog: 12 units for a smaller meal 14 units for a regular meal 16 units for a large meal: b'fast, lunch, dinner Please take no more than 10 units of insulin with a meal if you plan to exercise after that meal.  Pt checks her sugars 4x a day and they are still high: - am: 164-244 >> 147-244 >> 156-236 - 2h after b'fast: n/c >> 82-145, 215 - before lunch: 228-400 >> 72-180, few 200s - 2h after lunch: n/c - before dinner: 217-401 >> 90-250, 382 >> 70, 115-325, 405 - 2h after dinner: see below  - bedtime: 300-400s >> 173-358 >> 128-321, 421 No lows. Lowest sugar was 70; she has hypoglycemia awareness at 70.  Highest sugar was 400s.  Pt's meals are: - Breakfast:  Eggs, fruit (pinneapple) - Lunch: 1/2 sandwich chicken, fish, vegetables - Dinner: chicken + vegetables, yoghurt - Snacks: 1 in pm  She had DM classes in the past.  - no CKD.   Lab Results  Component Value Date   BUN 13 05/05/2014   Lab Results  Component Value Date   CREATININE 0.8 05/05/2014  On Valsartan. - no HL. Latest lipid levels: Lab Results  Component Value Date   CHOL 121 05/05/2014   HDL 52.40 05/05/2014   LDLCALC 54 05/05/2014   TRIG 72.0 05/05/2014   CHOLHDL 2 05/05/2014   - last eye exam was in 05/2013. No DR. + cataracts. Next exam:  07/2014.  - no numbness and tingling in her feet. Sees a podiatrist, last visit 03/02/2014. Also saw Dr March Rummage (has DM shoes and inserts)  ROS: Constitutional:+ weight gain, no fatigue, no subjective hyperthermia/hypothermia, + poor sleep, + nocturia, + easy bruising Eyes: no blurry vision, no xerophthalmia ENT: no sore throat, no nodules palpated in throat, no dysphagia/odynophagia, no hoarseness Cardiovascular: no CP/SOB/palpitations/+ leg swelling Respiratory: no cough/SOB Gastrointestinal: no N/V/D/C Musculoskeletal: no muscle/+ joint aches Skin: no rashes Neurological: no tremors/numbness/tingling/dizziness, + HA  I reviewed pt's medications, allergies, PMH, social hx, family hx, and changes were documented in the history of present illness. Otherwise, unchanged from my initial visit note:   Past Medical History  Diagnosis Date  . Anxiety   . Hypertension   . IBS (irritable bowel syndrome)   . Hyperplastic colon polyp   . Allergy   . Anemia   . Arthritis   . Diverticulosis   . Dilated aortic root     seen on prior echo but echo 05/2013 showed normal dimensions  . Bicuspid aortic valve   . LVE (left ventricular enlargement)     mild by echo 1/15 with EF 50-55%   Past Surgical History  Procedure Laterality Date  . Tubal ligation    . Colonoscopy    . Polypectomy    .  Tooth extraction      3 teeth  . Belpharoptosis repair     History   Social History  . Marital Status: divorced    Spouse Name: N/A    Number of Children: 1   Occupational History  . Retired    Social History Main Topics  . Smoking status: Never Smoker   . Smokeless tobacco: Never Used  . Alcohol Use: No  . Drug Use: No   Current Outpatient Prescriptions on File Prior to Visit  Medication Sig Dispense Refill  . ACCU-CHEK AVIVA PLUS test strip 1 each by Other route as needed. Test 4 times daily    . ALPRAZolam (XANAX) 0.5 MG tablet TAKE 1 TABLET BY MOUTH IN THE MORNING AND 1/2 TABLET AT LUNCH  DAILY 60 tablet 0  . amLODipine (NORVASC) 5 MG tablet Take 5 mg by mouth daily.     Marland Kitchen aspirin 81 MG tablet Take 81 mg by mouth daily.      . Calcium Carbonate-Vitamin D (CALCIUM 600/VITAMIN D) 600-400 MG-UNIT per tablet Take 2 tablets by mouth daily.     . cholestyramine (QUESTRAN) 4 G packet Take 1/2-1 packet dissolved in water/juice once daily....................Marland KitchenPRN 90 each 3  . FLUoxetine (PROZAC) 20 MG capsule Take 20 mg by mouth daily.      . Garlic Oil 1448 MG CAPS Take 1 capsule by mouth daily.      Marland Kitchen glimepiride (AMARYL) 2 MG tablet Take 2 mg by mouth 2 (two) times daily.    Marland Kitchen ibuprofen (ADVIL,MOTRIN) 200 MG tablet Take 200 mg by mouth every 6 (six) hours as needed.      . insulin aspart (NOVOLOG FLEXPEN) 100 UNIT/ML FlexPen Inject 10-16 units into the skin 3 times daily as instructed. (Patient not taking: Reported on 06/07/2014) 15 mL 2  . Insulin Detemir (LEVEMIR FLEXTOUCH) 100 UNIT/ML Pen Inject 35 Units into the skin daily at 10 pm. 15 mL 2  . insulin lispro (HUMALOG) 100 UNIT/ML KiwkPen Inject 10-16 Units into the skin 3 (three) times daily.    . Insulin Pen Needle (VALUMARK PEN NEEDLES) 31G X 8 MM MISC Use to inject insulin 4 times daily as instructed. 150 each 3  . loratadine (CLARITIN) 10 MG tablet Take 10 mg by mouth daily as needed for allergies.    . meloxicam (MOBIC) 15 MG tablet Take 15 mg by mouth at bedtime.      . Multiple Vitamins-Minerals (CENTRUM SILVER PO) Take 1 capsule by mouth daily.      . Multiple Vitamins-Minerals (ICAPS MV PO) Take 2 capsules by mouth daily.     . Omega-3 Fatty Acids (FISH OIL) 1000 MG CAPS Take 1 capsule by mouth daily.    . traMADol (ULTRAM) 50 MG tablet Take 50 mg by mouth as needed.     . valsartan-hydrochlorothiazide (DIOVAN-HCT) 320-12.5 MG per tablet      No current facility-administered medications on file prior to visit.   Allergies  Allergen Reactions  . Metformin And Related Diarrhea    Pt has IBS    Family History  Problem  Relation Age of Onset  . Colon cancer Neg Hx   . Esophageal cancer Neg Hx   . Stomach cancer Neg Hx    PE: There were no vitals taken for this visit. There is no weight on file to calculate BMI.  Wt Readings from Last 3 Encounters:  05/27/14 228 lb (103.42 kg)  05/05/14 226 lb (102.513 kg)  03/18/14 227 lb (102.967  kg)   Constitutional: overweight, in NAD Eyes: PERRLA, EOMI, no exophthalmos ENT: moist mucous membranes, no thyromegaly, no cervical lymphadenopathy Cardiovascular: RRR, No MRG Respiratory: CTA B Gastrointestinal: abdomen soft, NT, ND, BS+ Musculoskeletal: no deformities, strength intact in all 4 Skin: moist, warm, no rashes, scars on arms (healed) Neurological: no tremor with outstretched hands, DTR normal in all 4  ASSESSMENT: 1. DM2, insulin-dependent, uncontrolled, without complications  PLAN:  1. Patient with long-standing, uncontrolled diabetes, on oral antidiabetic regimen + mealtime insulin, with better control after we added mealtime insulin at last visit, but sugars still high >> will increase mealtime insulin and Levemir. She has difficulty understanding the regimen and finds it complicated to remember to reduce the dose before being active...: Patient Instructions  Please increase: - Levemir to 40 units at bedtime. Please increase: - NovoLog: 14 units for a smaller meal 16 units for a regular meal 18 units for a large meal Please decrease the Amaryl 2 mg to dinnertime only (stop the one in the morning). Please take no more than 12 units of insulin with a meal if you plan to exercise or stay activeafter that meal. Please return in 1.5 month with your sugar log.    - continue checking sugars at different times of the day - check 3-4 times a day, rotating checks - she is UTD with eye exams - Return to clinic in 1.5 mo with sugar log

## 2014-06-17 ENCOUNTER — Telehealth: Payer: Self-pay

## 2014-06-17 DIAGNOSIS — Q231 Congenital insufficiency of aortic valve: Secondary | ICD-10-CM

## 2014-06-17 NOTE — Telephone Encounter (Signed)
-----   Message from Sueanne Margarita, MD sent at 06/13/2014 10:33 AM EST ----- Please let patient know that echo showed normal LVF with normally functioning bicuspid AV with no stenosis - repeat echo in 1 year

## 2014-06-17 NOTE — Telephone Encounter (Signed)
Patient informed of results and verbal understanding expressed.  Repeat ECHO ordered to be scheduled in 1 year.  Patient agrees with treatment plan.

## 2014-06-22 ENCOUNTER — Telehealth: Payer: Self-pay | Admitting: Internal Medicine

## 2014-06-22 MED ORDER — ONETOUCH VERIO W/DEVICE KIT: 1.0000 | PACK | Freq: Every day | Status: DC

## 2014-06-22 MED ORDER — ONETOUCH DELICA LANCETS 33G MISC: Status: DC

## 2014-06-22 MED ORDER — GLUCOSE BLOOD VI STRP: ORAL_STRIP | Status: DC

## 2014-06-22 NOTE — Telephone Encounter (Signed)
Patient states that she needs a new meter and her insurance will only cover   One [employer]  [employer]   Patient would also need test strips testing 4x daily   Please advise

## 2014-06-23 ENCOUNTER — Telehealth: Payer: Self-pay | Admitting: Internal Medicine

## 2014-06-23 MED ORDER — GLUCOSE BLOOD VI STRP: ORAL_STRIP | Status: DC

## 2014-06-23 MED ORDER — ONETOUCH VERIO W/DEVICE KIT: 1.0000 | PACK | Freq: Every day | Status: DC

## 2014-06-23 MED ORDER — ONETOUCH DELICA LANCETS 33G MISC: Status: DC

## 2014-06-23 NOTE — Telephone Encounter (Signed)
Please resend meter and test strip rx to [facility] not [facility] please  Test strips 4 times daily

## 2014-06-23 NOTE — Telephone Encounter (Signed)
Done

## 2014-06-25 ENCOUNTER — Encounter: Payer: Self-pay | Admitting: Internal Medicine

## 2014-06-28 ENCOUNTER — Telehealth: Payer: Self-pay | Admitting: Internal Medicine

## 2014-06-28 ENCOUNTER — Other Ambulatory Visit: Payer: Self-pay | Admitting: *Deleted

## 2014-06-28 MED ORDER — GLUCOSE BLOOD VI STRP: ORAL_STRIP | Status: AC

## 2014-06-28 NOTE — Telephone Encounter (Signed)
Done. Pt unable to read the One Touch Verio meter.

## 2014-06-28 NOTE — Telephone Encounter (Signed)
Pt stated that she could not read the One Touch Verio meter. Asked for [place] to send Aviva Plus test strips. Done.

## 2014-06-28 NOTE — Telephone Encounter (Signed)
Pt wants to go back to aviva test strips please call into [facility] on [address]

## 2014-07-02 ENCOUNTER — Telehealth: Payer: Self-pay

## 2014-07-02 NOTE — Telephone Encounter (Signed)
Pt came by office today. Pt is having issues wit her One Touch Verio meter. Pt states that she cannot see the test strips due to her Cataracts and she would like to try to get the Accu-Chek aviva plus meter proved through her insurance company. While discussing meter with the pt we went over her blood sugar. Pt states for the last 3 days in the evening her blood sugars have been dropping. Tuesday at 3pm blood sugar was 70, Wednesday at 4pm blood sugar was 53, and Thursday at 3 pmblood sugar was 76. Pt states on the days when her blood sugar has been dropping she has been exercising. Went over medications with pt. She is currently taking 35 units of Levemir at bedtime and 12 units of Novolog 3 times per day. Pt wanted to know if we could initiate PA for meter and adjust her insulin.  Please advise,  Thanks!

## 2014-07-02 NOTE — Telephone Encounter (Signed)
OK to start a PA.  Continue Levemir 35 units for now, but decrease NovoLog to 10 units with each meal. If she exercises after a meal, please take only 8 units with that meal.

## 2014-07-02 NOTE — Telephone Encounter (Signed)
Lvom advising of note below. PA will be started on Monday

## 2014-07-05 ENCOUNTER — Telehealth: Payer: Self-pay | Admitting: Internal Medicine

## 2014-07-05 NOTE — Telephone Encounter (Signed)
Please read message below and advise.  

## 2014-07-05 NOTE — Telephone Encounter (Signed)
OK, let's go back to the previous doses: - Levemir 35 units  - NovoLog 12 units with each meal.  If she exercises after a meal, please take only 8 units with that meal. Let us know how this goes by the end of the week.

## 2014-07-05 NOTE — Telephone Encounter (Signed)
Yesterday in AM 199, lunch 210, dinner 274, bedtime 352 This morning 226, lunch 238  Wanted [place] to know the sugars.

## 2014-07-13 ENCOUNTER — Encounter: Payer: Self-pay | Admitting: Internal Medicine

## 2014-07-13 NOTE — Progress Notes (Signed)
Received sugar log from patient for the last 3 weeks. She has decreased Levemir from 40-35 units due to occasional lows during the day. She continues 12 units of NovoLog before each meal. In the last 10 days, the sugars have increased tremendously, with the following values: - A.m.: 199-256 - Before lunch: 210-371 - Before dinner: 201- 337, 457 - Bedtime: 192- 375, 404   I will advise her to increase her insulin doses as follows: - Increase Levemir back to 40 units at bedtime - Increase NovoLog as follows: Breakfast: 14 units Lunch: 14 units Dinner: 18 units  Please call us back with her log in another week.

## 2014-07-14 NOTE — Progress Notes (Signed)
Left message for pt to call back to discuss insulin changes

## 2014-07-15 ENCOUNTER — Other Ambulatory Visit: Payer: Self-pay | Admitting: *Deleted

## 2014-07-15 MED ORDER — LANCETS ULTRA THIN 30G MISC: Status: AC

## 2014-07-15 MED ORDER — BLOOD GLUCOSE MONITOR KIT: PACK | Status: AC

## 2014-07-15 MED ORDER — BLOOD GLUCOSE TEST VI STRP: ORAL_STRIP | Status: DC

## 2014-07-15 NOTE — Telephone Encounter (Signed)
Pt unable to read the One Touch Ultra Meter, due to her cataracts. Switching brands if pt can read the meter.

## 2014-07-16 ENCOUNTER — Telehealth: Payer: Self-pay | Admitting: Internal Medicine

## 2014-07-16 NOTE — Telephone Encounter (Signed)
Returned pt's call, pt did not listen to vm left for her earlier (she was working out). Advised pt per Dr Arman Filter note. Pt understood and will call back next week with her sugar readings.

## 2014-07-16 NOTE — Telephone Encounter (Signed)
Patient called stating she was calling back a nurse Amber Bishop would like to know if her medication is going to be changed?  Also, her blood sugars have been   2.25.16  282 before lunch 387  327 before bed   2.26.16   178 this morning  270 after eating a peanut butter cracker   Patient would like a call asap to discuss insulin instructions   Thank you

## 2014-07-23 ENCOUNTER — Telehealth: Payer: Self-pay | Admitting: Internal Medicine

## 2014-07-23 NOTE — Telephone Encounter (Signed)
Nurse with [facility] case manager Sheryl # [phone]  Nurse calling regarding the test strip Josem Kaufmann has this been sent back to [place]?

## 2014-07-23 NOTE — Telephone Encounter (Signed)
Called Sheryl from [facility]. Advised her that I worked with the pharmacist at her pharmacy; we got pt another meter and supplies, one that her ins would cover and one she could see the strip to check her blood sugar. She understood and will let ins know.

## 2014-07-26 ENCOUNTER — Other Ambulatory Visit: Payer: Self-pay | Admitting: Family Medicine

## 2014-07-26 DIAGNOSIS — R945 Abnormal results of liver function studies: Principal | ICD-10-CM

## 2014-07-26 DIAGNOSIS — R7989 Other specified abnormal findings of blood chemistry: Secondary | ICD-10-CM

## 2014-07-28 ENCOUNTER — Ambulatory Visit (INDEPENDENT_AMBULATORY_CARE_PROVIDER_SITE_OTHER): Payer: PPO | Admitting: Internal Medicine

## 2014-07-28 ENCOUNTER — Encounter: Payer: Self-pay | Admitting: Internal Medicine

## 2014-07-28 VITALS — BP 122/68 | HR 77 | Temp 98.3°F | Resp 12 | Wt 223.6 lb

## 2014-07-28 DIAGNOSIS — IMO0001 Reserved for inherently not codable concepts without codable children: Secondary | ICD-10-CM

## 2014-07-28 DIAGNOSIS — E1165 Type 2 diabetes mellitus with hyperglycemia: Secondary | ICD-10-CM

## 2014-07-28 NOTE — Patient Instructions (Signed)
Please continue: - Levemir 40 units at bedtime.  Please increase: - NovoLog to: 14 units for a smaller meal 16 units for a regular meal 18 units for a large meal  Stop Amaryl.  Please take no more than 14 units of insulin with a meal if you plan to exercise or stay activeafter that meal.  Please return in 1.5 month with your sugar log.

## 2014-07-28 NOTE — Progress Notes (Signed)
Patient ID: Amber Bishop, female   DOB: 24-Apr-1951, 64 y.o.   MRN: [id number]  HPI: Amber Bishop is a 64 y.o.-year-old female, returning for f/u for DM2, dx 1991, insulin-dependent since 08/2013 after steroid inj., uncontrolled, without complications. She is here with her fiancee who offers part of the history. Last visit 1.5 mo ago.  Since last visit, she had labs by PCP >> low platelets and also high LFTs. She will see a hematologist. She will also have a liver U/S. She has a gastroenterologist (Dr. Olevia Perches).   Last hemoglobin A1c was: Lab Results  Component Value Date   HGBA1C 10.1* 05/05/2014  01/04/2014: 8%  She was on: - Levemir to 35 units at bedtime. - NovoLog: 12 units for a smaller meal 14 units for a regular meal 16 units for a large meal: b'fast, lunch, dinner Please take no more than 10 units of insulin with a meal if you plan to exercise after that meal. She was on Metformin before >> diarrhea, stomach cramps (IBS)  Reviewed tel notes since last visit: 07/16/2014: Received sugar log from patient for the last 3 weeks. She has decreased Levemir from 40-35 units due to occasional lows during the day. She continues 12 units of NovoLog before each meal. In the last 10 days, the sugars have increased tremendously, with the following values: - A.m.: 199-256 - Before lunch: 210-371 - Before dinner: 201- 337, 457 - Bedtime: 192- 375, 404   I will advise her to increase her insulin doses as follows: - Increase Levemir back to 40 units at bedtime - Increase NovoLog as follows: Breakfast: 14 units Lunch: 14 units Dinner: 18 units  Please call us back with her log in another week.  She is now taking: - Levemir 35-40 units - NovoLog: 12 units before meals, not higher  She tells me she is afraid to take the full insulin doses.  Pt checks her sugars 4x a day and they are still high: - am: 164-244 >> 147-244 >> 156-236 >> 156-306 - 2h after b'fast: n/c >> 82-145, 215  >> n/c - before lunch: 228-400 >> 72-180, few 200s >> 143-315 - 2h after lunch: n/c  - before dinner: 217-401 >> 90-250, 382 >> 70, 115-325, 405 >> 204-417 - 2h after dinner: see below  - bedtime: 300-400s >> 173-358 >> 128-321, 421 >> 238-418 No lows. Lowest sugar was 70; she has hypoglycemia awareness at 70.  Highest sugar was 400s.  Pt's meals are: - Breakfast:  Eggs, fruit (pinneapple) - Lunch: 1/2 sandwich chicken, fish, vegetables - Dinner: chicken + vegetables, yoghurt - Snacks: 1 in pm  She had DM classes in the past.  - no CKD.   Lab Results  Component Value Date   BUN 13 05/05/2014   Lab Results  Component Value Date   CREATININE 0.8 05/05/2014  On Valsartan. - no HL. Latest lipid levels: Lab Results  Component Value Date   CHOL 121 05/05/2014   HDL 52.40 05/05/2014   LDLCALC 54 05/05/2014   TRIG 72.0 05/05/2014   CHOLHDL 2 05/05/2014   - last eye exam was in 05/2013. No DR. + cataracts. Next exam: 07/2014.  - no numbness and tingling in her feet. Sees a podiatrist, last visit 03/02/2014. Also saw Dr March Rummage (has DM shoes and inserts)  ROS: Constitutional:+ weight loss, no fatigue, no subjective hyperthermia/hypothermia Eyes: + blurry vision, no xerophthalmia ENT: no sore throat, no nodules palpated in throat, no dysphagia/odynophagia, no hoarseness, + tinnitus  Cardiovascular: no CP/SOB/palpitations/leg swelling Respiratory: no cough/SOB/+ wheezing Gastrointestinal: no N/V/D/C Musculoskeletal: + muscle/+ joint aches Skin: no rashes Neurological: no tremors/numbness/tingling/dizziness  I reviewed pt's medications, allergies, PMH, social hx, family hx, and changes were documented in the history of present illness. Otherwise, unchanged from my initial visit note:   Past Medical History  Diagnosis Date  . Anxiety   . Hypertension   . IBS (irritable bowel syndrome)   . Hyperplastic colon polyp   . Allergy   . Anemia   . Arthritis   . Diverticulosis   .  Dilated aortic root     seen on prior echo but echo 05/2013 showed normal dimensions  . Bicuspid aortic valve   . LVE (left ventricular enlargement)     mild by echo 1/15 with EF 50-55%   Past Surgical History  Procedure Laterality Date  . Tubal ligation    . Colonoscopy    . Polypectomy    . Tooth extraction      3 teeth  . Belpharoptosis repair     History   Social History  . Marital Status: divorced    Spouse Name: N/A    Number of Children: 1   Occupational History  . Retired    Social History Main Topics  . Smoking status: Never Smoker   . Smokeless tobacco: Never Used  . Alcohol Use: No  . Drug Use: No   Current Outpatient Prescriptions on File Prior to Visit  Medication Sig Dispense Refill  . ALPRAZolam (XANAX) 0.5 MG tablet TAKE 1 TABLET BY MOUTH IN THE MORNING AND 1/2 TABLET AT LUNCH DAILY 60 tablet 0  . amLODipine (NORVASC) 5 MG tablet Take 5 mg by mouth daily.     Marland Kitchen aspirin 81 MG tablet Take 81 mg by mouth daily.      . blood glucose meter kit and supplies KIT Dispense based on patient and insurance preference. Use up to four times daily as directed. Dx code: E11.65 1 each 0  . Calcium Carbonate-Vitamin D (CALCIUM 600/VITAMIN D) 600-400 MG-UNIT per tablet Take 2 tablets by mouth daily.     . cholestyramine (QUESTRAN) 4 G packet Take 1/2-1 packet dissolved in water/juice once daily....................Marland KitchenPRN 90 each 3  . FLUoxetine (PROZAC) 20 MG capsule Take 20 mg by mouth daily.      . Garlic Oil 3300 MG CAPS Take 1 capsule by mouth daily.      Marland Kitchen glimepiride (AMARYL) 2 MG tablet Take 2 mg by mouth daily before supper.    Marland Kitchen glucose blood (ACCU-CHEK AVIVA PLUS) test strip Test 4 times daily as instructed. 125 each 3  . Glucose Blood (BLOOD GLUCOSE TEST STRIPS) STRP Use to test blood sugar 4 times daily as instructed. Dx code: E11.65 125 each 0  . ibuprofen (ADVIL,MOTRIN) 200 MG tablet Take 200 mg by mouth every 6 (six) hours as needed.      . insulin aspart (NOVOLOG  FLEXPEN) 100 UNIT/ML FlexPen Inject 12-18 units into the skin 3 times daily as instructed. 15 mL 2  . Insulin Detemir (LEVEMIR FLEXTOUCH) 100 UNIT/ML Pen Inject 40 Units into the skin daily at 10 pm. 15 mL 2  . insulin lispro (HUMALOG) 100 UNIT/ML KiwkPen Inject 10-16 Units into the skin 3 (three) times daily.    . Insulin Pen Needle (VALUMARK PEN NEEDLES) 31G X 8 MM MISC Use to inject insulin 4 times daily as instructed. 150 each 3  . LANCETS ULTRA THIN 30G MISC Use to test blood  sugar 4 times daily as instructed. Dx code: E11.65 200 each 2  . loratadine (CLARITIN) 10 MG tablet Take 10 mg by mouth daily as needed for allergies.    . meloxicam (MOBIC) 15 MG tablet Take 15 mg by mouth at bedtime.      . Multiple Vitamins-Minerals (CENTRUM SILVER PO) Take 1 capsule by mouth daily.      . Multiple Vitamins-Minerals (ICAPS MV PO) Take 2 capsules by mouth daily.     . Omega-3 Fatty Acids (FISH OIL) 1000 MG CAPS Take 1 capsule by mouth daily.    . traMADol (ULTRAM) 50 MG tablet Take 50 mg by mouth as needed.     . valsartan-hydrochlorothiazide (DIOVAN-HCT) 320-12.5 MG per tablet Take 1 tablet by mouth daily.      No current facility-administered medications on file prior to visit.   Allergies  Allergen Reactions  . Metformin And Related Diarrhea    Pt has IBS    Family History  Problem Relation Age of Onset  . Colon cancer Neg Hx   . Esophageal cancer Neg Hx   . Stomach cancer Neg Hx    PE: BP 122/68 mmHg  Pulse 77  Temp(Src) 98.3 F (36.8 C) (Oral)  Resp 12  Wt 223 lb 9.6 oz (101.424 kg)  SpO2 95% Body mass index is 38.36 kg/(m^2).  Wt Readings from Last 3 Encounters:  07/28/14 223 lb 9.6 oz (101.424 kg)  06/16/14 230 lb (104.327 kg)  05/27/14 228 lb (103.42 kg)   Constitutional: overweight, in NAD Eyes: PERRLA, EOMI, no exophthalmos ENT: moist mucous membranes, no thyromegaly, no cervical lymphadenopathy Cardiovascular: RRR, No MRG Respiratory: CTA B Gastrointestinal: abdomen  soft, NT, ND, BS+ Musculoskeletal: no deformities, strength intact in all 4 Skin: moist, warm, no rashes, scars on arms (healed) Neurological: no tremor with outstretched hands, DTR normal in all 4  ASSESSMENT: 1. DM2, insulin-dependent, uncontrolled, without complications  PLAN:  1. Patient with long-standing, uncontrolled diabetes, on oral antidiabetic regimen + mealtime insulin, with better control after we added mealtime insulin, but sugars still high as she is not taking the recommended doses. She is afraid that the insulin has increased her LFTs >> advised her this is not the case. She also believes she is taking too high doses on insulin. Since sugars are 200-400, I advised her this is not the case.   Patient Instructions  Please continue: - Levemir 40 units at bedtime.  Please increase: - NovoLog to: 14 units for a smaller meal 16 units for a regular meal 18 units for a large meal  Stop Amaryl.  Please take no more than 14 units of insulin with a meal if you plan to exercise or stay activeafter that meal.  Please return in 1.5 month with your sugar log.  - continue checking sugars at different times of the day - check 3-4 times a day, rotating checks - she is UTD with eye exams - Return to clinic in 1.5 mo with sugar log - check hba1c then

## 2014-07-29 ENCOUNTER — Telehealth: Payer: Self-pay | Admitting: Internal Medicine

## 2014-07-29 MED ORDER — INSULIN DETEMIR 100 UNIT/ML FLEXPEN: 40.0000 [IU] | PEN_INJECTOR | Freq: Every day | SUBCUTANEOUS | Status: DC

## 2014-07-29 NOTE — Telephone Encounter (Signed)
Refill for Levemir sent to pt's pharmacy.

## 2014-07-29 NOTE — Telephone Encounter (Signed)
lantus rx needs new rx for her lantus [facility] states there are no more refills

## 2014-07-30 ENCOUNTER — Other Ambulatory Visit: Payer: Self-pay | Admitting: *Deleted

## 2014-07-30 ENCOUNTER — Other Ambulatory Visit: Payer: Self-pay | Admitting: Internal Medicine

## 2014-07-30 MED ORDER — INSULIN ASPART 100 UNIT/ML FLEXPEN: PEN_INJECTOR | SUBCUTANEOUS | Status: DC

## 2014-07-30 NOTE — Telephone Encounter (Signed)
Pt came by stating that the pharmacy will not let her have a refill of novolog. Dr increased her novolog dosage at last ov. Rx refill with new dosage sent to pt's pharmacy.

## 2014-08-02 ENCOUNTER — Telehealth: Payer: Self-pay | Admitting: Hematology

## 2014-08-02 ENCOUNTER — Other Ambulatory Visit: Payer: Self-pay | Admitting: Internal Medicine

## 2014-08-02 ENCOUNTER — Telehealth: Payer: Self-pay | Admitting: Internal Medicine

## 2014-08-02 MED ORDER — ALPRAZOLAM 0.5 MG PO TABS: ORAL_TABLET | ORAL | Status: DC

## 2014-08-02 NOTE — Telephone Encounter (Signed)
CALLED PT LEFT MESSAGE TO SCHEDULE NEW PT APPT.   DX: THROMBOCYTOPENIA REFERRING:  DR WHITE

## 2014-08-03 ENCOUNTER — Other Ambulatory Visit: Payer: Self-pay | Admitting: *Deleted

## 2014-08-03 ENCOUNTER — Ambulatory Visit
Admission: RE | Admit: 2014-08-03 | Discharge: 2014-08-03 | Disposition: A | Discharge status: Home / Self Care | Payer: PPO | Source: Ambulatory Visit | Attending: Family Medicine | Admitting: Family Medicine

## 2014-08-03 DIAGNOSIS — R945 Abnormal results of liver function studies: Principal | ICD-10-CM

## 2014-08-03 DIAGNOSIS — R7989 Other specified abnormal findings of blood chemistry: Secondary | ICD-10-CM

## 2014-08-03 MED ORDER — BLOOD GLUCOSE TEST VI STRP: ORAL_STRIP | Status: DC

## 2014-08-03 NOTE — Telephone Encounter (Signed)
Faxed Rx for Alprazolam Duanne Moron), 0.5 mg, #60 with no refills to [facility] on 08/03/14.

## 2014-08-06 ENCOUNTER — Other Ambulatory Visit: Payer: Self-pay | Admitting: Family Medicine

## 2014-08-06 DIAGNOSIS — K769 Liver disease, unspecified: Secondary | ICD-10-CM

## 2014-08-09 ENCOUNTER — Other Ambulatory Visit: Payer: Self-pay | Admitting: Pharmacist

## 2014-08-09 NOTE — Patient Outreach (Signed)
Ms. Mazariego is a 64 year old female referred to pharmacy for medication assistance.  She reports that she is having difficulty affording her insulin. Though she is able to purchase it, she feels like the cost is too much for her on top of her other costs of living. She reports that she has attempted to get subsidized housing in the past but that she was denied due to "making too much." She denies ever applying for low income subsidy or extra help through Medicare.   She is not sure that she will qualify but is interested in speaking to someone about it and about patient assistance programs for her insulin.   Will refer her to Lurline Del, [facility] care management assistant, for questions about low income subsidy and patient assistance programs for her insulin.   Nicoletta Ba, PharmD, [facility] Resident [phone]

## 2014-08-10 ENCOUNTER — Ambulatory Visit
Admission: RE | Admit: 2014-08-10 | Discharge: 2014-08-10 | Disposition: A | Discharge status: Home / Self Care | Payer: PPO | Source: Ambulatory Visit | Attending: Family Medicine | Admitting: Family Medicine

## 2014-08-10 DIAGNOSIS — K769 Liver disease, unspecified: Secondary | ICD-10-CM

## 2014-08-10 MED ORDER — IOPAMIDOL (ISOVUE-300) INJECTION 61%
125.0000 mL | Freq: Once | INTRAVENOUS | Status: AC | PRN
  Administered 2014-08-10: 125 mL via INTRAVENOUS

## 2014-08-11 ENCOUNTER — Other Ambulatory Visit (HOSPITAL_COMMUNITY): Payer: Self-pay | Admitting: Gastroenterology

## 2014-08-11 ENCOUNTER — Telehealth: Payer: Self-pay | Admitting: Internal Medicine

## 2014-08-11 DIAGNOSIS — K8689 Other specified diseases of pancreas: Secondary | ICD-10-CM

## 2014-08-12 ENCOUNTER — Telehealth: Payer: Self-pay | Admitting: Hematology

## 2014-08-12 NOTE — Telephone Encounter (Signed)
Left a message for patient to call back. 

## 2014-08-12 NOTE — Telephone Encounter (Signed)
Called pt left vm in ref to rescheduled appt to 08/24/14@10 :30  Per Dr. Burr Medico.

## 2014-08-13 ENCOUNTER — Other Ambulatory Visit: Payer: Self-pay | Admitting: Radiology

## 2014-08-16 ENCOUNTER — Other Ambulatory Visit: Payer: Self-pay | Admitting: Radiology

## 2014-08-16 NOTE — Telephone Encounter (Signed)
Spoke with patient and she is having an ultrasound at [facility] radiology and she had questions about insulin and the procedure. Patient given radiology number to call to discuss. She was trying to reach Dr. Erlinda Hong office. Gave her his number.

## 2014-08-17 ENCOUNTER — Encounter (HOSPITAL_COMMUNITY): Payer: Self-pay

## 2014-08-17 ENCOUNTER — Ambulatory Visit (HOSPITAL_COMMUNITY)
Admission: RE | Admit: 2014-08-17 | Discharge: 2014-08-17 | Disposition: A | Discharge status: Home / Self Care | Payer: PPO | Source: Ambulatory Visit | Attending: Gastroenterology | Admitting: Gastroenterology

## 2014-08-17 ENCOUNTER — Ambulatory Visit (HOSPITAL_COMMUNITY)
Admission: RE | Admit: 2014-08-17 | Discharge: 2014-08-17 | Disposition: A | Discharge status: Home / Self Care | Payer: PPO | Source: Ambulatory Visit | Attending: Interventional Radiology | Admitting: Interventional Radiology

## 2014-08-17 DIAGNOSIS — C787 Secondary malignant neoplasm of liver and intrahepatic bile duct: Secondary | ICD-10-CM | POA: Insufficient documentation

## 2014-08-17 DIAGNOSIS — K5790 Diverticulosis of intestine, part unspecified, without perforation or abscess without bleeding: Secondary | ICD-10-CM | POA: Diagnosis not present

## 2014-08-17 DIAGNOSIS — K8689 Other specified diseases of pancreas: Secondary | ICD-10-CM

## 2014-08-17 DIAGNOSIS — I10 Essential (primary) hypertension: Secondary | ICD-10-CM | POA: Insufficient documentation

## 2014-08-17 DIAGNOSIS — C259 Malignant neoplasm of pancreas, unspecified: Secondary | ICD-10-CM | POA: Insufficient documentation

## 2014-08-17 DIAGNOSIS — M1990 Unspecified osteoarthritis, unspecified site: Secondary | ICD-10-CM | POA: Diagnosis not present

## 2014-08-17 DIAGNOSIS — Q231 Congenital insufficiency of aortic valve: Secondary | ICD-10-CM | POA: Insufficient documentation

## 2014-08-17 DIAGNOSIS — D696 Thrombocytopenia, unspecified: Secondary | ICD-10-CM | POA: Diagnosis not present

## 2014-08-17 DIAGNOSIS — Z794 Long term (current) use of insulin: Secondary | ICD-10-CM | POA: Diagnosis not present

## 2014-08-17 DIAGNOSIS — I517 Cardiomegaly: Secondary | ICD-10-CM | POA: Diagnosis not present

## 2014-08-17 DIAGNOSIS — Z7982 Long term (current) use of aspirin: Secondary | ICD-10-CM | POA: Diagnosis not present

## 2014-08-17 DIAGNOSIS — K769 Liver disease, unspecified: Secondary | ICD-10-CM | POA: Diagnosis present

## 2014-08-17 DIAGNOSIS — K589 Irritable bowel syndrome without diarrhea: Secondary | ICD-10-CM | POA: Insufficient documentation

## 2014-08-17 DIAGNOSIS — Z79899 Other long term (current) drug therapy: Secondary | ICD-10-CM | POA: Diagnosis not present

## 2014-08-17 DIAGNOSIS — F419 Anxiety disorder, unspecified: Secondary | ICD-10-CM | POA: Diagnosis not present

## 2014-08-17 LAB — PROTIME-INR
INR: 1.08 (ref 0.00–1.49)
Prothrombin Time: 14.1 seconds (ref 11.6–15.2)

## 2014-08-17 LAB — GLUCOSE, CAPILLARY
GLUCOSE-CAPILLARY: 314 mg/dL — AB (ref 70–99)
Glucose-Capillary: 282 mg/dL — ABNORMAL HIGH (ref 70–99)
Glucose-Capillary: 246 mg/dL — ABNORMAL HIGH (ref 70–99)
Glucose-Capillary: 236 mg/dL — ABNORMAL HIGH (ref 70–99)

## 2014-08-17 LAB — CBC
HCT: 43.5 % (ref 36.0–46.0)
HEMOGLOBIN: 14.2 g/dL (ref 12.0–15.0)
MCH: 28.3 pg (ref 26.0–34.0)
MCHC: 32.6 g/dL (ref 30.0–36.0)
MCV: 86.8 fL (ref 78.0–100.0)
PLATELETS: 85 10*3/uL — AB (ref 150–400)
RBC: 5.01 MIL/uL (ref 3.87–5.11)
RDW: 13.8 % (ref 11.5–15.5)
WBC: 5.7 10*3/uL (ref 4.0–10.5)

## 2014-08-17 LAB — APTT: APTT: 28 s (ref 24–37)

## 2014-08-17 LAB — TYPE AND SCREEN
ABO/RH(D): B POS
Antibody Screen: NEGATIVE

## 2014-08-17 LAB — ABO/RH: ABO/RH(D): B POS

## 2014-08-17 MED ORDER — MIDAZOLAM HCL 2 MG/2ML IJ SOLN
INTRAMUSCULAR | Status: AC | PRN
  Administered 2014-08-17 (×2): 0.5 mg via INTRAVENOUS
  Administered 2014-08-17: 1 mg via INTRAVENOUS
  Administered 2014-08-17 (×2): 0.5 mg via INTRAVENOUS

## 2014-08-17 MED ORDER — FENTANYL CITRATE 0.05 MG/ML IJ SOLN
INTRAMUSCULAR | Status: AC | PRN
  Administered 2014-08-17: 50 ug via INTRAVENOUS

## 2014-08-17 MED ORDER — INSULIN ASPART 100 UNIT/ML ~~LOC~~ SOLN
0.0000 [IU] | SUBCUTANEOUS | Status: DC
  Administered 2014-08-17: 11 [IU] via SUBCUTANEOUS
  Administered 2014-08-17: 5 [IU] via SUBCUTANEOUS
  Filled 2014-08-17 (×2): qty 1

## 2014-08-17 MED ORDER — HYDROCODONE-ACETAMINOPHEN 5-325 MG PO TABS
1.0000 | ORAL_TABLET | ORAL | Status: DC | PRN
  Filled 2014-08-17: qty 2

## 2014-08-17 MED ORDER — MIDAZOLAM HCL 2 MG/2ML IJ SOLN
INTRAMUSCULAR | Status: AC
  Filled 2014-08-17: qty 4

## 2014-08-17 MED ORDER — MIDAZOLAM HCL 2 MG/2ML IJ SOLN
INTRAMUSCULAR | Status: AC
  Filled 2014-08-17: qty 6

## 2014-08-17 MED ORDER — FLUMAZENIL 0.5 MG/5ML IV SOLN
INTRAVENOUS | Status: AC
  Filled 2014-08-17: qty 5

## 2014-08-17 MED ORDER — NALOXONE HCL 0.4 MG/ML IJ SOLN
INTRAMUSCULAR | Status: AC
  Filled 2014-08-17: qty 1

## 2014-08-17 MED ORDER — SODIUM CHLORIDE 0.9 % IV SOLN
INTRAVENOUS | Status: DC
Start: 2014-08-17 — End: 2014-08-18
  Administered 2014-08-17: 500 mL via INTRAVENOUS

## 2014-08-17 MED ORDER — FENTANYL CITRATE 0.05 MG/ML IJ SOLN
INTRAMUSCULAR | Status: DC
Start: 2014-08-17 — End: 2014-08-18
  Filled 2014-08-17: qty 4

## 2014-08-17 NOTE — Procedures (Signed)
Interventional Radiology Procedure Note  Procedure: US guided biopsy liver lesion  Complications: None  Estimated Blood Loss: 0  Recommendations: - Bedrest x 3 hrs  Signed,  Criselda Peaches, MD

## 2014-08-17 NOTE — Discharge Instructions (Signed)
Liver Biopsy, Care After °Refer to this sheet in the next few weeks. These instructions provide you with information on caring for yourself after your procedure. Your health care provider may also give you more specific instructions. Your treatment has been planned according to current medical practices, but problems sometimes occur. Call your health care provider if you have any problems or questions after your procedure. °WHAT TO EXPECT AFTER THE PROCEDURE °After your procedure, it is typical to have the following: °· A small amount of discomfort in the area where the biopsy was done and in the right shoulder or shoulder blade. °· A small amount of bruising around the area where the biopsy was done and on the skin over the liver. °· Sleepiness and fatigue for the rest of the day. °HOME CARE INSTRUCTIONS  °· Rest at home for 1-2 days or as directed by your health care provider. °· Have a friend or family member stay with you for at least 24 hours. °· Because of the medicines used during the procedure, you should not do the following things in the first 24 hours: °¨ Drive. °¨ Use machinery. °¨ Be responsible for the care of other people. °¨ Sign legal documents. °¨ Take a bath or shower. °· There are many different ways to close and cover an incision, including stitches, skin glue, and adhesive strips. Follow your health care provider's instructions on: °¨ Incision care. °¨ Bandage (dressing) changes and removal. °¨ Incision closure removal. °· Do not drink alcohol in the first week. °· Do not lift more than 5 pounds or play contact sports for 2 weeks after this test. °· Take medicines only as directed by your health care provider. Do not take medicine containing aspirin or non-steroidal anti-inflammatory medicines such as ibuprofen for 1 week after this test. °· It is your responsibility to get your test results. °SEEK MEDICAL CARE IF:  °· You have increased bleeding from an incision that results in more than a  small spot of blood. °· You have redness, swelling, or increasing pain in any incisions. °· You notice a discharge or a bad smell coming from any of your incisions. °· You have a fever or chills. °SEEK IMMEDIATE MEDICAL CARE IF:  °· You develop swelling, bloating, or pain in your abdomen. °· You become dizzy or faint. °· You develop a rash. °· You are nauseous or vomit. °· You have difficulty breathing, feel short of breath, or feel faint. °· You develop chest pain. °· You have problems with your speech or vision. °· You have trouble balancing or moving your arms or legs. °Document Released: 11/24/2004 Document Revised: 09/21/2013 Document Reviewed: 07/03/2013 °ExitCare® Patient Information ©2015 [employer]. This information is not intended to replace advice given to you by your health care provider. Make sure you discuss any questions you have with your health care provider. °Conscious Sedation °Sedation is the use of medicines to promote relaxation and relieve discomfort and anxiety. Conscious sedation is a type of sedation. Under conscious sedation you are less alert than normal but are still able to respond to instructions or stimulation. Conscious sedation is used during short medical and dental procedures. It is milder than deep sedation or general anesthesia and allows you to return to your regular activities sooner.  °LET YOUR HEALTH CARE PROVIDER KNOW ABOUT:  °· Any allergies you have. °· All medicines you are taking, including vitamins, herbs, eye drops, creams, and over-the-counter medicines. °· Use of steroids (by mouth or creams). °·   Previous problems you or members of your family have had with the use of anesthetics. °· Any blood disorders you have. °· Previous surgeries you have had. °· Medical conditions you have. °· Possibility of pregnancy, if this applies. °· Use of cigarettes, alcohol, or illegal drugs. °RISKS AND COMPLICATIONS °Generally, this is a safe procedure. However, as with any  procedure, problems can occur. Possible problems include: °· Oversedation. °· Trouble breathing on your own. You may need to have a breathing tube until you are awake and breathing on your own. °· Allergic reaction to any of the medicines used for the procedure. °BEFORE THE PROCEDURE °· You may have blood tests done. These tests can help show how well your kidneys and liver are working. They can also show how well your blood clots. °· A physical exam will be done.   °· Only take medicines as directed by your health care provider. You may need to stop taking medicines (such as blood thinners, aspirin, or nonsteroidal anti-inflammatory drugs) before the procedure.   °· Do not eat or drink at least 6 hours before the procedure or as directed by your health care provider. °· Arrange for a responsible adult, family member, or friend to take you home after the procedure. He or she should stay with you for at least 24 hours after the procedure, until the medicine has worn off. °PROCEDURE  °· An intravenous (IV) catheter will be inserted into one of your veins. Medicine will be able to flow directly into your body through this catheter. You may be given medicine through this tube to help prevent pain and help you relax. °· The medical or dental procedure will be done. °AFTER THE PROCEDURE °· You will stay in a recovery area until the medicine has worn off. Your blood pressure and pulse will be checked.   °·  Depending on the procedure you had, you may be allowed to go home when you can tolerate liquids and your pain is under control. °Document Released: 01/30/2001 Document Revised: 05/12/2013 Document Reviewed: 01/12/2013 °ExitCare® Patient Information ©2015 [employer]. This information is not intended to replace advice given to you by your health care provider. Make sure you discuss any questions you have with your health care provider. ° °

## 2014-08-17 NOTE — H&P (Signed)
Chief Complaint: "I'm here for a liver biopsy" Referring Physician:Outlaw HPI: Amber Bishop is an 64 y.o. female with newly found pancreatic mass with liver lesions on CT. She is referred for biopsy Chart, PMHx, meds, labs, imaging reviewed. Recent hx of thrombocytopenia, PLTs in outside office 80k Pt has been NPO today  Past Medical History:  Past Medical History  Diagnosis Date  . Anxiety   . Hypertension   . IBS (irritable bowel syndrome)   . Hyperplastic colon polyp   . Allergy   . Anemia   . Arthritis   . Diverticulosis   . Dilated aortic root     seen on prior echo but echo 05/2013 showed normal dimensions  . Bicuspid aortic valve   . LVE (left ventricular enlargement)     mild by echo 1/15 with EF 50-55%    Past Surgical History:  Past Surgical History  Procedure Laterality Date  . Tubal ligation      age 89  . Colonoscopy    . Polypectomy    . Tooth extraction      3 teeth  . Belpharoptosis repair      Family History:  Family History  Problem Relation Age of Onset  . Colon cancer Neg Hx   . Esophageal cancer Neg Hx   . Stomach cancer Neg Hx     Social History:  reports that she has never smoked. She has never used smokeless tobacco. She reports that she does not drink alcohol or use illicit drugs.  Allergies:  Allergies  Allergen Reactions  . Metformin And Related Diarrhea    Pt has IBS     Medications:   Medication List    ASK your doctor about these medications        ALPRAZolam 0.5 MG tablet  Commonly known as:  XANAX  TAKE ONE TABLET BY MOUTH IN THE MORNING AND TAKE ONE-HALF TABLET AT LUNCH     amLODipine 5 MG tablet  Commonly known as:  NORVASC  Take 5 mg by mouth every morning.     aspirin 81 MG tablet  Take 81 mg by mouth every morning.     blood glucose meter kit and supplies Kit  Dispense based on patient and insurance preference. Use up to four times daily as directed. Dx code: E11.65     CALCIUM 600/VITAMIN D 600-400  MG-UNIT per tablet  Generic drug:  Calcium Carbonate-Vitamin D  Take 1 tablet by mouth 2 (two) times daily.     CENTRUM SILVER PO  Take 1 capsule by mouth every morning.     ICAPS MV PO  Take 1 capsule by mouth 2 (two) times daily.     cholestyramine 4 G packet  Commonly known as:  QUESTRAN  Take 1/2-1 packet dissolved in water/juice once daily....................Marland KitchenPRN     Fish Oil 1000 MG Caps  Take 1 capsule by mouth every morning.     FLUoxetine 20 MG capsule  Commonly known as:  PROZAC  Take 20 mg by mouth every morning.     glucose blood test strip  Commonly known as:  ACCU-CHEK AVIVA PLUS  Test 4 times daily as instructed.     BLOOD GLUCOSE TEST STRIPS Strp  Use to test blood sugar 4 times daily as instructed. Dx code: E11.65     insulin aspart 100 UNIT/ML injection  Commonly known as:  novoLOG  Inject 10-14 Units into the skin 3 (three) times daily before meals.     insulin  aspart 100 UNIT/ML FlexPen  Commonly known as:  NOVOLOG FLEXPEN  Inject 14-18 units into the skin 3 times daily as instructed.     Insulin Detemir 100 UNIT/ML Pen  Commonly known as:  LEVEMIR FLEXTOUCH  Inject 40 Units into the skin daily at 10 pm.     Insulin Pen Needle 31G X 8 MM Misc  Commonly known as:  VALUMARK PEN NEEDLES  Use to inject insulin 4 times daily as instructed.     LANCETS ULTRA THIN 30G Misc  Use to test blood sugar 4 times daily as instructed. Dx code: E11.65     loratadine 10 MG tablet  Commonly known as:  CLARITIN  Take 10 mg by mouth daily as needed for allergies.     meloxicam 15 MG tablet  Commonly known as:  MOBIC  Take 15 mg by mouth every morning.     PREVNAR 13 Susp injection  Generic drug:  pneumococcal 13-valent conjugate vaccine  Inject 0.5 mLs into the muscle once.     traMADol 50 MG tablet  Commonly known as:  ULTRAM  Take 50 mg by mouth every 6 (six) hours as needed for severe pain.     valsartan-hydrochlorothiazide 320-12.5 MG per tablet   Commonly known as:  DIOVAN-HCT  Take 1 tablet by mouth every morning.        Please HPI for pertinent positives, otherwise complete 10 system ROS negative.  Physical Exam: BP 167/83 mmHg  Pulse 73  Temp(Src) 98.3 F (36.8 C) (Oral)  Resp 16  Ht 5' 4.5" (1.638 m)  Wt 221 lb (100.245 kg)  BMI 37.36 kg/m2  SpO2 99% Body mass index is 37.36 kg/(m^2).   General Appearance:  Alert, cooperative, no distress, appears stated age  Head:  Normocephalic, without obvious abnormality, atraumatic  ENT: Unremarkable  Neck: Supple, symmetrical, trachea midline  Lungs:   Clear to auscultation bilaterally, no w/r/r, respirations unlabored without use of accessory muscles.  Chest Wall:  No tenderness or deformity  Heart:  Regular rate and rhythm, S1, S2 normal, no murmur, rub or gallop.  Abdomen:   Soft, non-tender, non distended.  Neurologic: Normal affect, no gross deficits.  Labs: Results for orders placed or performed during the hospital encounter of 08/17/14 (from the past 48 hour(s))  Type and screen     Status: None   Collection Time: 08/17/14 10:10 AM  Result Value Ref Range   ABO/RH(D) B POS    Antibody Screen NEG    Sample Expiration 08/20/2014   APTT upon arrival     Status: None   Collection Time: 08/17/14 10:10 AM  Result Value Ref Range   aPTT 28 24 - 37 seconds  CBC upon arrival     Status: Abnormal   Collection Time: 08/17/14 10:10 AM  Result Value Ref Range   WBC 5.7 4.0 - 10.5 K/uL   RBC 5.01 3.87 - 5.11 MIL/uL   Hemoglobin 14.2 12.0 - 15.0 g/dL   HCT 43.5 36.0 - 46.0 %   MCV 86.8 78.0 - 100.0 fL   MCH 28.3 26.0 - 34.0 pg   MCHC 32.6 30.0 - 36.0 g/dL   RDW 13.8 11.5 - 15.5 %   Platelets 85 (L) 150 - 400 K/uL    Comment: SPECIMEN CHECKED FOR CLOTS REPEATED TO VERIFY PLATELET COUNT CONFIRMED BY SMEAR   Protime-INR upon arrival     Status: None   Collection Time: 08/17/14 10:10 AM  Result Value Ref Range   Prothrombin Time  14.1 11.6 - 15.2 seconds   INR 1.08  0.00 - 1.49  ABO/Rh     Status: None   Collection Time: 08/17/14 10:10 AM  Result Value Ref Range   ABO/RH(D) B POS     Imaging: No results found.  Assessment/Plan Pancreatic mass with liver lesions. For US guided liver lesion biopsy today Thrombocytopenia, PLTs up to 85k today. Reviewed with Dr. Laurence Ferrari, ok to proceed without PLT transfusion. Other labs reviewed, ok Risks and Benefits discussed with the patient including, but not limited to bleeding, infection, damage to adjacent structures or low yield requiring additional tests. All of the patient's questions were answered, patient is agreeable to proceed. Consent signed and in chart.   Ascencion Dike PA-C 08/17/2014, 11:57 AM

## 2014-08-17 NOTE — Progress Notes (Signed)
CBG was 314 at 1614 today and pt was given Novolog 11 units SQ insulin as per sliding scale. Explained to patient she is to check her CBG prior to supper and follow her instructions at home as to whether she takes her before supper insulin but she would probably not need to take that insulin. She is also to check her CBG as usual before bedtime and take her Levmire 40 units as directed from her PCP. Written and verbal instructions discussed with patient. She is very anxious that "my blood sugar might go too low" Explained to patient that is unlikely today as her CBG is elevated today but checking her CBG as she usually does each day at home prior to meals and at bedtime and document as she usually does to show to her PCP. And to take her insulin as directed by her CBG readings.Pt verbalized understanding

## 2014-08-17 NOTE — Progress Notes (Signed)
Pt is oozing blood around IV site right antecubital area. Dressing replaced. Awaiting results of labs drawn earlier for platelet results

## 2014-08-17 NOTE — Progress Notes (Signed)
Pt arrived today for prep for her Liver biopsy. "I feel bad today". Pt states she feels this way when her blood sugar is elevated. States "blood sugar was 279 this morning fasting and she took her Levmere 40 units last night and  Novolog insulin14 units and ate toast and egg at 0515 this morning". Currently CBG is 282. Marland Kitchen Peripheral IV started and labs drawn as ordered. Pt is very animated and talkative but states she is sleepy.

## 2014-08-17 NOTE — Progress Notes (Signed)
Platelet results are "85" and also patients blood sugar of 282 upon arrival to short stay at 0945. This was reported to Ascencion Dike PA Radiology . He will discuss with Dr Geroge Baseman and call back

## 2014-08-17 NOTE — Progress Notes (Signed)
Dr Laurence Ferrari into see patient. Aware of plt ct at 85 and CBG now 242. Insulin ordered

## 2014-08-20 ENCOUNTER — Other Ambulatory Visit: Payer: Self-pay | Admitting: Pharmacist

## 2014-08-20 NOTE — Patient Outreach (Signed)
[facility]) Care Management  [place]   08/20/2014  Amber Bishop 10-Feb-1951 [id number]  Subjective: Amber Bishop is a 64 y.o. female who was referred to [place] for more information about applying for low income subsidy and help affording her insulin. I called her today to review the low income subsidy.  Objective:   Current Medications: Current Outpatient Prescriptions  Medication Sig Dispense Refill  . ALPRAZolam (XANAX) 0.5 MG tablet TAKE ONE TABLET BY MOUTH IN THE MORNING AND TAKE ONE-HALF TABLET AT LUNCH (Patient taking differently: takes 3x per day of needed) 60 tablet 0  . amLODipine (NORVASC) 5 MG tablet Take 5 mg by mouth every morning.     Marland Kitchen aspirin 81 MG tablet Take 81 mg by mouth every morning.     . blood glucose meter kit and supplies KIT Dispense based on patient and insurance preference. Use up to four times daily as directed. Dx code: E11.65 1 each 0  . Calcium Carbonate-Vitamin D (CALCIUM 600/VITAMIN D) 600-400 MG-UNIT per tablet Take 1 tablet by mouth 2 (two) times daily.     . cholestyramine (QUESTRAN) 4 G packet Take 1/2-1 packet dissolved in water/juice once daily....................Marland KitchenPRN (Patient taking differently: as needed. Take 1/2-1 packet dissolved in water/juice once daily.....................PRN) 90 each 3  . FLUoxetine (PROZAC) 20 MG capsule Take 20 mg by mouth every morning.     Marland Kitchen glucose blood (ACCU-CHEK AVIVA PLUS) test strip Test 4 times daily as instructed. 125 each 3  . Glucose Blood (BLOOD GLUCOSE TEST STRIPS) STRP Use to test blood sugar 4 times daily as instructed. Dx code: E11.65 125 each 5  . insulin aspart (NOVOLOG FLEXPEN) 100 UNIT/ML FlexPen Inject 14-18 units into the skin 3 times daily as instructed. (Patient taking differently: Inject 10-12 Units into the skin 3 (three) times daily with meals. ) 15 mL 2  . insulin aspart (NOVOLOG) 100 UNIT/ML injection Inject 10-14 Units into the skin 3 (three) times daily  before meals.    . Insulin Detemir (LEVEMIR FLEXTOUCH) 100 UNIT/ML Pen Inject 40 Units into the skin daily at 10 pm. 15 mL 2  . Insulin Pen Needle (VALUMARK PEN NEEDLES) 31G X 8 MM MISC Use to inject insulin 4 times daily as instructed. 150 each 3  . LANCETS ULTRA THIN 30G MISC Use to test blood sugar 4 times daily as instructed. Dx code: E11.65 200 each 2  . loratadine (CLARITIN) 10 MG tablet Take 10 mg by mouth daily as needed for allergies.    . meloxicam (MOBIC) 15 MG tablet Take 15 mg by mouth every morning.     . Multiple Vitamins-Minerals (CENTRUM SILVER PO) Take 1 capsule by mouth every morning.     . Multiple Vitamins-Minerals (ICAPS MV PO) Take 1 capsule by mouth 2 (two) times daily.     . Omega-3 Fatty Acids (FISH OIL) 1000 MG CAPS Take 1 capsule by mouth every morning.     Marland Kitchen PREVNAR 13 SUSP injection Inject 0.5 mLs into the muscle once.   0  . traMADol (ULTRAM) 50 MG tablet Take 50 mg by mouth every 6 (six) hours as needed for severe pain.     . valsartan-hydrochlorothiazide (DIOVAN-HCT) 320-12.5 MG per tablet Take 1 tablet by mouth every morning.      No current facility-administered medications for this visit.    Functional Status: In your present state of health, do you have any difficulty performing the following activities: 08/17/2014  Is the patient  deaf or have difficulty hearing? Y  Hearing N  Vision N  Difficulty concentrating or making decisions N  Walking or climbing stairs? N    Fall/Depression Screening: No flowsheet data found.  Assessment: unable to assess   Plan: Patient was currently at her physician's and asked if we could speak at a different time. I told her that I would call her on Monday April 4th. Patient verbalized understanding.

## 2014-08-24 ENCOUNTER — Ambulatory Visit (HOSPITAL_BASED_OUTPATIENT_CLINIC_OR_DEPARTMENT_OTHER): Payer: PPO | Admitting: Hematology

## 2014-08-24 ENCOUNTER — Encounter: Payer: Self-pay | Admitting: Hematology

## 2014-08-24 ENCOUNTER — Other Ambulatory Visit (HOSPITAL_BASED_OUTPATIENT_CLINIC_OR_DEPARTMENT_OTHER): Payer: PPO

## 2014-08-24 ENCOUNTER — Telehealth: Payer: Self-pay | Admitting: *Deleted

## 2014-08-24 ENCOUNTER — Ambulatory Visit: Payer: PPO

## 2014-08-24 ENCOUNTER — Telehealth: Payer: Self-pay | Admitting: Hematology

## 2014-08-24 VITALS — BP 140/76 | HR 88 | Temp 98.0°F | Resp 21 | Ht 64.5 in | Wt 209.1 lb

## 2014-08-24 DIAGNOSIS — C259 Malignant neoplasm of pancreas, unspecified: Secondary | ICD-10-CM

## 2014-08-24 DIAGNOSIS — D696 Thrombocytopenia, unspecified: Secondary | ICD-10-CM | POA: Diagnosis not present

## 2014-08-24 DIAGNOSIS — C787 Secondary malignant neoplasm of liver and intrahepatic bile duct: Secondary | ICD-10-CM | POA: Diagnosis not present

## 2014-08-24 LAB — CBC WITH DIFFERENTIAL/PLATELET
BASO%: 1 % (ref 0.0–2.0)
Basophils Absolute: 0.1 10*3/uL (ref 0.0–0.1)
EOS%: 3.9 % (ref 0.0–7.0)
Eosinophils Absolute: 0.2 10*3/uL (ref 0.0–0.5)
HCT: 42.6 % (ref 34.8–46.6)
HGB: 14 g/dL (ref 11.6–15.9)
LYMPH#: 1.7 10*3/uL (ref 0.9–3.3)
LYMPH%: 26.9 % (ref 14.0–49.7)
MCH: 28.3 pg (ref 25.1–34.0)
MCHC: 32.9 g/dL (ref 31.5–36.0)
MCV: 86.2 fL (ref 79.5–101.0)
MONO#: 0.5 10*3/uL (ref 0.1–0.9)
MONO%: 8.5 % (ref 0.0–14.0)
NEUT%: 59.7 % (ref 38.4–76.8)
NEUTROS ABS: 3.7 10*3/uL (ref 1.5–6.5)
Platelets: 86 10*3/uL — ABNORMAL LOW (ref 145–400)
RBC: 4.94 10*6/uL (ref 3.70–5.45)
RDW: 13.8 % (ref 11.2–14.5)
WBC: 6.1 10*3/uL (ref 3.9–10.3)

## 2014-08-24 LAB — COMPREHENSIVE METABOLIC PANEL (CC13)
ALT: 96 U/L — AB (ref 0–55)
ANION GAP: 12 meq/L — AB (ref 3–11)
AST: 74 U/L — ABNORMAL HIGH (ref 5–34)
Albumin: 3.7 g/dL (ref 3.5–5.0)
Alkaline Phosphatase: 212 U/L — ABNORMAL HIGH (ref 40–150)
BUN: 18.8 mg/dL (ref 7.0–26.0)
CHLORIDE: 99 meq/L (ref 98–109)
CO2: 29 meq/L (ref 22–29)
Calcium: 8.9 mg/dL (ref 8.4–10.4)
Creatinine: 1 mg/dL (ref 0.6–1.1)
EGFR: 62 mL/min/{1.73_m2} — ABNORMAL LOW (ref >90)
GLUCOSE: 366 mg/dL — AB (ref 70–140)
Potassium: 3.9 mEq/L (ref 3.5–5.1)
SODIUM: 140 meq/L (ref 136–145)
TOTAL PROTEIN: 6.6 g/dL (ref 6.4–8.3)
Total Bilirubin: 0.98 mg/dL (ref 0.20–1.20)

## 2014-08-24 LAB — LACTATE DEHYDROGENASE (CC13): LDH: 309 U/L — AB (ref 125–245)

## 2014-08-24 NOTE — Telephone Encounter (Signed)
Left VM asking if Dr. Ritta Slot office does dental cleanings on chemo patients. New start that has had no dental care in years, diabetic and starting chemo. If not, please recommend someone in community. Left VM for financial advocate to determine if she qualifies for any assistance from [facility] for dental care?

## 2014-08-24 NOTE — Progress Notes (Signed)
[place]  Telephone:([phone]) [phone] Fax:([phone]) [place] Note   Patient Care Team: Harlan Stains, MD as PCP - General (Family Medicine) 08/24/2014  CHIEF COMPLAINTS/PURPOSE OF CONSULTATION:  Thrombocytopenia and a newly diagnosed metastatic pancreas cancer  Oncology History   Pancreatic cancer metastasized to liver   Staging form: Pancreas, AJCC 7th Edition     Clinical: Stage IV (T2, NX, M1) - Unsigned       Pancreatic cancer metastasized to liver   08/10/2014 Imaging CT abdomen showed 5.4 x 3.2 cm pancreatic body mass consistent with pancreatic adenocarcinoma. There is adjacent lymphadenopathy and diffuse  hepatic metastatic disease.   08/17/2014 Pathology Results Liver biopsy showed adenocarcinoma, consistent with pancreatic primary.   08/17/2014 Initial Diagnosis Pancreatic cancer metastasized to liver   08/24/2014 Tumor Marker CA19.9 >140000    HISTORY OF PRESENTING ILLNESS:  Amber Bishop 64 y.o. female was referred by her primary care physician Dr. Dema Severin for thrombocytopenia. She also was recently diagnosed with metastatic pancreatic cancer.   She has had a chronic thrombocytopenia since 2013. Her plt count has been in the range of 100-150, but dropped to the range of 80s lately. She denies any signs of bleeding. No history of liver disease.  She has been diabetic for 24 years, and her blood glucose has been out of control in the past one year. She also reports fatigue in the past one year. She denies any significant abdominal pain, jaundice, bloating, or change of her bowel habits. Her appetite has been normal. No nausea or vomiting. She is able to do all selfcare, but she sleeps more than usually during the day. No abdominal pain or bloating. She was found to have abnormal LFTs on routine lab work, and her PCP Dr. Dema Severin, who ordered and abdominal US which lead to a CT scan, which showed a 5.4 x 3.4 cm pancreatic body mass, adjacent  lymphadenopathy, and diffuse hepatic metastasis. She underwent ultrasound-guided liver biopsy on 08/17/2014, and the biopsy showed adenocarcinoma consistent with pancreatic primary.   MEDICAL HISTORY:  Past Medical History  Diagnosis Date  . Anxiety   . Hypertension   . IBS (irritable bowel syndrome)   . Hyperplastic colon polyp   . Allergy   . Anemia   . Arthritis   . Diverticulosis   . Dilated aortic root     seen on prior echo but echo 05/2013 showed normal dimensions  . Bicuspid aortic valve   . LVE (left ventricular enlargement)     mild by echo 1/15 with EF 50-55%    SURGICAL HISTORY: Past Surgical History  Procedure Laterality Date  . Tubal ligation      age 14  . Colonoscopy    . Polypectomy    . Tooth extraction      3 teeth  . Belpharoptosis repair      SOCIAL HISTORY: History   Social History  . Marital Status: Single    Spouse Name: N/A  . Number of Children: 1 son   . Years of Education: N/A   Occupational History  . 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 Smoking status: Never Smoker   . Smokeless tobacco: Never Used  . Alcohol Use: No  . Drug Use: No  . Sexual Activity: Not on file   Other Topics Concern  . Not on file   Social History Narrative    FAMILY HISTORY: Family History  Problem Relation Age of  Onset  . Colon cancer Neg Hx   . Esophageal cancer Neg Hx   . Stomach cancer Neg Hx     ALLERGIES:  is allergic to naprosyn and metformin and related.  MEDICATIONS:  Current Outpatient Prescriptions  Medication Sig Dispense Refill  . ALPRAZolam (XANAX) 0.5 MG tablet TAKE ONE TABLET BY MOUTH IN THE MORNING AND TAKE ONE-HALF TABLET AT LUNCH (Patient taking differently: takes 3x per day of needed) 60 tablet 0  . amLODipine (NORVASC) 5 MG tablet Take 5 mg by mouth every morning.     . blood glucose meter kit and supplies KIT Dispense based on patient and insurance preference. Use up to four times daily as directed. Dx  code: E11.65 1 each 0  . Calcium Carbonate-Vitamin D (CALCIUM 600/VITAMIN D) 600-400 MG-UNIT per tablet Take 1 tablet by mouth 2 (two) times daily.     . cholestyramine (QUESTRAN) 4 G packet Take 1/2-1 packet dissolved in water/juice once daily....................Marland KitchenPRN (Patient taking differently: as needed. Take 1/2-1 packet dissolved in water/juice once daily.....................PRN) 90 each 3  . FLUoxetine (PROZAC) 20 MG capsule Take 20 mg by mouth every morning.     Marland Kitchen glucose blood (ACCU-CHEK AVIVA PLUS) test strip Test 4 times daily as instructed. 125 each 3  . Glucose Blood (BLOOD GLUCOSE TEST STRIPS) STRP Use to test blood sugar 4 times daily as instructed. Dx code: E11.65 125 each 5  . insulin aspart (NOVOLOG FLEXPEN) 100 UNIT/ML FlexPen Inject 14-18 units into the skin 3 times daily as instructed. (Patient taking differently: Inject 10-12 Units into the skin 3 (three) times daily with meals. ) 15 mL 2  . insulin aspart (NOVOLOG) 100 UNIT/ML injection Inject 10-14 Units into the skin 3 (three) times daily before meals.    . Insulin Degludec (TRESIBA FLEXTOUCH [place]) Inject 44 Units into the skin at bedtime.    . Insulin Pen Needle (VALUMARK PEN NEEDLES) 31G X 8 MM MISC Use to inject insulin 4 times daily as instructed. 150 each 3  . LANCETS ULTRA THIN 30G MISC Use to test blood sugar 4 times daily as instructed. Dx code: E11.65 200 each 2  . loratadine (CLARITIN) 10 MG tablet Take 10 mg by mouth daily as needed for allergies.    . meloxicam (MOBIC) 15 MG tablet Take 15 mg by mouth every morning.     . Multiple Vitamins-Minerals (CENTRUM SILVER PO) Take 1 capsule by mouth every morning.     . Multiple Vitamins-Minerals (ICAPS MV PO) Take 1 capsule by mouth 2 (two) times daily.     . Omega-3 Fatty Acids (FISH OIL) 1000 MG CAPS Take 1 capsule by mouth every morning.     Marland Kitchen PREVNAR 13 SUSP injection Inject 0.5 mLs into the muscle once.   0  . traMADol (ULTRAM) 50 MG tablet Take 50 mg by mouth every 6  (six) hours as needed for severe pain.     . valsartan-hydrochlorothiazide (DIOVAN-HCT) 320-12.5 MG per tablet Take 1 tablet by mouth every morning.     Marland Kitchen aspirin 81 MG tablet Take 81 mg by mouth every morning.      No current facility-administered medications for this visit.    REVIEW OF SYSTEMS:   Constitutional: Denies fevers, chills or abnormal night sweats Eyes: Denies blurriness of vision, double vision or watery eyes Ears, nose, mouth, throat, and face: Denies mucositis or sore throat Respiratory: Denies cough, dyspnea or wheezes Cardiovascular: Denies palpitation, chest discomfort or lower extremity swelling Gastrointestinal:  Denies nausea,  heartburn or change in bowel habits Skin: Denies abnormal skin rashes Lymphatics: Denies new lymphadenopathy or easy bruising Neurological:Denies numbness, tingling or new weaknesses Behavioral/Psych: Mood is stable, no new changes  All other systems were reviewed with the patient and are negative.   PHYSICAL EXAMINATION: ECOG PERFORMANCE STATUS: 1 - Symptomatic but completely ambulatory  Filed Vitals:   08/24/14 1108  BP: 140/76  Pulse: 88  Temp: 98 F (36.7 C)  Resp: 21   Filed Weights   08/24/14 1108  Weight: 209 lb 1.6 oz (94.847 kg)    GENERAL:alert, no distress and comfortable SKIN: skin color, texture, turgor are normal, no rashes or significant lesions EYES: normal, conjunctiva are pink and non-injected, sclera clear OROPHARYNX:no exudate, no erythema and lips, buccal mucosa, and tongue normal  NECK: supple, thyroid normal size, non-tender, without nodularity LYMPH:  no palpable lymphadenopathy in the cervical, axillary or inguinal LUNGS: clear to auscultation and percussion with normal breathing effort HEART: regular rate & rhythm and no murmurs and no lower extremity edema ABDOMEN:abdomen soft, non-tender and normal bowel sounds Musculoskeletal:no cyanosis of digits and no clubbing  PSYCH: alert & oriented x 3 with  fluent speech NEURO: no focal motor/sensory deficits  LABORATORY DATA:  I have reviewed the data as listed Lab Results  Component Value Date   WBC 5.7 08/17/2014   HGB 14.2 08/17/2014   HCT 43.5 08/17/2014   MCV 86.8 08/17/2014   PLT 85* 08/17/2014    Recent Labs  05/05/14 0818  NA 138  K 3.6  CL 105  CO2 28  GLUCOSE 245*  BUN 13  CREATININE 0.8  CALCIUM 8.8  PROT 6.4  ALBUMIN 3.8  AST 18  ALT 25  ALKPHOS 68  BILITOT 0.5    RADIOGRAPHIC STUDIES: I have personally reviewed the radiological images as listed and agreed with the findings in the report.  Ct Abdomen Pelvis W Wo Contrast 08/10/2014    IMPRESSION: 1. 5.4 x 3.2 cm pancreatic body mass consistent with pancreatic adenocarcinoma. There is adjacent lymphadenopathy and diffuse hepatic metastatic disease. The portal vein is patent. 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 2. Very geographic fatty infiltration of the liver. 3. Left-sided biliary dilatation. There is likely meta tumor compressing the left hepatic duct. 4. Uterine fibroids.   Electronically Signed   By: Marijo Sanes M.D.   On: 08/10/2014 14:14     ASSESSMENT & PLAN:  64 year old Caucasian female, with past medical history of diabetes, hypertension, IBS, arthritis, who was found to have worsening thrombocytopenia, and a newly diagnosed metastatic rectal cancer.  1. Pancreatic cancer with liver metastasis -I reviewed her CT scan findings in the liver mass biopsy results in great detail. The image was reviewed in person. -I'll obtain a CT chest without contrast to ruled out metastasis and the finish staging -I discussed the incurable nature of metastatic pancreas cancer and overall poor prognosis.  -I recommend systemic chemotherapy. The goal of treatment is palliation and to prolong her life. -I discussed the option of single agent gemcitabine, gemcitabine with Abraxane, FOLFOX or FOLFIRINOX -Due to her significant  some cytopenia, I think gemcitabine-based regimen would be difficult to tolerate. I recommend a FOLFOX as first line therapy. Side effects of chemotherapy therapy were discussed with patient and she agrees to proceed. -Chemotherapy consent: Side effects including but does not not limited to, fatigue, nausea, vomiting, diarrhea, hair loss, neuropathy, fluid retention, renal and kidney dysfunction, neutropenic fever, needed for blood  transfusion, bleeding, were discussed with patient in great detail. She agrees to proceed. -Port placement in the next week. -I'll tentatively schedule her to start chemotherapy in a week or 2  2. Thrombocytopenia -This appears to be chronic, she has no history of liver disease, CT scan did not reveal liver cirrhosis or spleen medically. 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 If her platelet count drops significantly after chemotherapy, I'll try a course of prednisone.  -As a bone marrow disease is also possible. Giving the overall poor prognosis from a metastatic pancreas cancer, I did not recommend a bone marrow biopsy at this point.  3. Diabetes, hypertension, IBS, anxiety -She will continue follow-up with her primary care physician  Plan -CT chest without contrast -Port placement by IR -Chemotherapy class -Labs today and next visit -Tentatively start FOLFOX next Thursday and I'll see her before chemotherapy.   All questions were answered. The patient knows to call the clinic with any problems, questions or concerns. I spent 55 minutes counseling the patient face to face. The total time spent in the appointment was 60 minutes and more than 50% was on counseling.     Truitt Merle, MD 08/24/2014 11:44 AM

## 2014-08-24 NOTE — Telephone Encounter (Signed)
gave and printed appt sched and avs for pt for April....sed added tx. °

## 2014-08-24 NOTE — Progress Notes (Signed)
Checked in new pt with no financial concerns at this time.  Informed pt if chemo is part of her treatment I will contact foundations that offer copay assistance if needed.  She has my card for any billing questions or concerns. ° °

## 2014-08-24 NOTE — [facility] Oncology Navigator Note (Signed)
Met with patient and significant other during new patient visit. Explained the role of the GI Nurse Navigator and provided New Patient Packet with information on: 1. Pancreatic cancer 2. Support groups 3. Advanced Directives 4. Fall Safety Plan Answered questions, reviewed current treatment plan using TEACH back and provided emotional support. Provided copy of current treatment plan (see media). Will make referral for nutrition to assist with her diabetes management and social worker in regards to her anxiety and finances.  Merceda Elks, RN, BSN GI Oncology [place]

## 2014-08-25 ENCOUNTER — Telehealth: Payer: Self-pay | Admitting: *Deleted

## 2014-08-25 ENCOUNTER — Encounter: Payer: Self-pay | Admitting: Hematology

## 2014-08-25 DIAGNOSIS — C787 Secondary malignant neoplasm of liver and intrahepatic bile duct: Secondary | ICD-10-CM

## 2014-08-25 DIAGNOSIS — C259 Malignant neoplasm of pancreas, unspecified: Secondary | ICD-10-CM | POA: Insufficient documentation

## 2014-08-25 LAB — CANCER ANTIGEN 19-9

## 2014-08-25 NOTE — Progress Notes (Signed)
Dr. Burr Medico would like pt to see a dentist before starting treatment.  Pt does not have dental insurance nor the means to pay for the service.  I spoke to pt regarding the [place] to possibly assist with her dental bill if she qualifies and sees a dentist that's not associated with [facility].  Pt will bring her bank statement on 08/30/14 to see if she qualifies for the grant.  I will also call the dentist of her choice to price the cleaning and see if this is something we can assist with.

## 2014-08-25 NOTE — Telephone Encounter (Signed)
Received message from pt's son Leyan Branden would like to speak with Dr. Burr Medico about pt's meeting with md yesterday.  Herbie Baltimore lives out of town and would like to understand more about pt's disease process. [facility]  Phone   [phone].

## 2014-08-26 ENCOUNTER — Telehealth: Payer: Self-pay | Admitting: *Deleted

## 2014-08-26 ENCOUNTER — Other Ambulatory Visit: Payer: Self-pay | Admitting: *Deleted

## 2014-08-26 DIAGNOSIS — C259 Malignant neoplasm of pancreas, unspecified: Secondary | ICD-10-CM

## 2014-08-26 DIAGNOSIS — C787 Secondary malignant neoplasm of liver and intrahepatic bile duct: Principal | ICD-10-CM

## 2014-08-26 NOTE — Telephone Encounter (Signed)
Called IR to follow up on [facility] appointment. They report calling patient's home and have not gotten return call. She needs to call [phone] and ask for Tiffany. Called patient and gave her contact information to call for her PAC-encouraged her to do this today. She reports Dr. Buddy Duty adjusted her routine insulin and sliding scale. Blood sugar last night was 141. Today was 211. Reports having seasonal allergy symptoms now and sneezing. No fever or dyspnea. Having more back pain since her PCP stopped her Tylenol (was taking #6 325 mg tabs/day with her Mobic twice daily) for her arthritis. PCP told her not to take it because of her diabetes and her liver.  Noted her liver functions are elevated. Will ask Dr. Burr Medico if OK to take some Tylenol prn. She also expresses frustration/anger/anxiety about her diagnosis and all the procedures she is needing to do now. Says she feels tired and her housework exhausts her. Suggested she prioritize her home chores and save most important for when she has most energy-let some things go for awhile. Discussed her church family being able to help her. Have already requested social work referral.

## 2014-08-27 ENCOUNTER — Telehealth: Payer: Self-pay | Admitting: *Deleted

## 2014-08-27 ENCOUNTER — Encounter: Payer: Self-pay | Admitting: *Deleted

## 2014-08-27 NOTE — Progress Notes (Signed)
[place] Work  Clinical Social Work was referred by patient navigator for assessment of psychosocial needs due to financial concerns and need for dental appointment and assistance.  Clinical Social Worker reviewed chart and pt plans to meet with financial counselor on 08/30/14 to address this concern. CSW also phoned pt and left message in order to develop relationship with pt. CSW has NO additonal resource for dental coverage for cleaning. CSW will attempt to meet with pt at her appointment with Dr. Burr Medico on 4/14 to try to further assess needs.   Clinical Social Work interventions: Resource assistance  Loren Racer, [place] Worker [place]  [place] Phone: [phone] Fax: [phone]

## 2014-08-27 NOTE — Telephone Encounter (Signed)
Son left VM requesting RN call to discuss her case. Called back and reviewed her disease stage and location of mets. Reviewed tx plan and chemo side effects. Reviewed her schedule. He plans to be here for her 1st treatment. Appreciated the call back. He will try to get his mom on My Chart with him as the proxy since she is not computer savvy.

## 2014-08-30 ENCOUNTER — Other Ambulatory Visit: Payer: Self-pay | Admitting: Radiology

## 2014-08-30 ENCOUNTER — Ambulatory Visit: Payer: PPO

## 2014-08-30 ENCOUNTER — Encounter: Payer: Self-pay | Admitting: Hematology

## 2014-08-30 DIAGNOSIS — C787 Secondary malignant neoplasm of liver and intrahepatic bile duct: Secondary | ICD-10-CM

## 2014-08-30 MED ORDER — LIDOCAINE-PRILOCAINE 2.5-2.5 % EX CREA
1.0000 | TOPICAL_CREAM | CUTANEOUS | Status: DC | PRN
Start: 2014-08-30 — End: 2014-12-15

## 2014-08-30 NOTE — Progress Notes (Signed)
Pt is approved for the $400 [facility] grant.  °

## 2014-08-31 ENCOUNTER — Encounter (HOSPITAL_COMMUNITY): Payer: Self-pay

## 2014-08-31 ENCOUNTER — Telehealth: Payer: Self-pay | Admitting: *Deleted

## 2014-08-31 ENCOUNTER — Ambulatory Visit (HOSPITAL_COMMUNITY)
Admission: RE | Admit: 2014-08-31 | Discharge: 2014-08-31 | Disposition: A | Discharge status: Home / Self Care | Payer: PPO | Source: Ambulatory Visit | Attending: Hematology | Admitting: Hematology

## 2014-08-31 ENCOUNTER — Other Ambulatory Visit: Payer: Self-pay | Admitting: Hematology

## 2014-08-31 DIAGNOSIS — E119 Type 2 diabetes mellitus without complications: Secondary | ICD-10-CM | POA: Diagnosis not present

## 2014-08-31 DIAGNOSIS — C787 Secondary malignant neoplasm of liver and intrahepatic bile duct: Secondary | ICD-10-CM | POA: Insufficient documentation

## 2014-08-31 DIAGNOSIS — D696 Thrombocytopenia, unspecified: Secondary | ICD-10-CM | POA: Insufficient documentation

## 2014-08-31 DIAGNOSIS — C259 Malignant neoplasm of pancreas, unspecified: Secondary | ICD-10-CM | POA: Insufficient documentation

## 2014-08-31 DIAGNOSIS — K589 Irritable bowel syndrome without diarrhea: Secondary | ICD-10-CM | POA: Insufficient documentation

## 2014-08-31 DIAGNOSIS — Z79899 Other long term (current) drug therapy: Secondary | ICD-10-CM | POA: Diagnosis not present

## 2014-08-31 DIAGNOSIS — I10 Essential (primary) hypertension: Secondary | ICD-10-CM | POA: Diagnosis not present

## 2014-08-31 DIAGNOSIS — R911 Solitary pulmonary nodule: Secondary | ICD-10-CM | POA: Diagnosis not present

## 2014-08-31 HISTORY — DX: Malignant (primary) neoplasm, unspecified: C80.1

## 2014-08-31 LAB — CBC WITH DIFFERENTIAL/PLATELET
Basophils Absolute: 0.1 10*3/uL (ref 0.0–0.1)
Basophils Relative: 1 % (ref 0–1)
Eosinophils Absolute: 0.2 10*3/uL (ref 0.0–0.7)
Eosinophils Relative: 3 % (ref 0–5)
HEMATOCRIT: 40.3 % (ref 36.0–46.0)
Hemoglobin: 13 g/dL (ref 12.0–15.0)
LYMPHS ABS: 1.7 10*3/uL (ref 0.7–4.0)
Lymphocytes Relative: 25 % (ref 12–46)
MCH: 28 pg (ref 26.0–34.0)
MCHC: 32.3 g/dL (ref 30.0–36.0)
MCV: 86.7 fL (ref 78.0–100.0)
Monocytes Absolute: 0.7 10*3/uL (ref 0.1–1.0)
Monocytes Relative: 10 % (ref 3–12)
Neutro Abs: 3.9 10*3/uL (ref 1.7–7.7)
Neutrophils Relative %: 61 % (ref 43–77)
PLATELETS: 98 10*3/uL — AB (ref 150–400)
RBC: 4.65 MIL/uL (ref 3.87–5.11)
RDW: 13.7 % (ref 11.5–15.5)
WBC: 6.6 10*3/uL (ref 4.0–10.5)

## 2014-08-31 LAB — PROTIME-INR
INR: 1.17 (ref 0.00–1.49)
PROTHROMBIN TIME: 15 s (ref 11.6–15.2)

## 2014-08-31 LAB — GLUCOSE, CAPILLARY: Glucose-Capillary: 217 mg/dL — ABNORMAL HIGH (ref 70–99)

## 2014-08-31 MED ORDER — CEFAZOLIN SODIUM-DEXTROSE 2-3 GM-% IV SOLR
INTRAVENOUS | Status: AC
  Filled 2014-08-31: qty 50

## 2014-08-31 MED ORDER — FENTANYL CITRATE 0.05 MG/ML IJ SOLN
INTRAMUSCULAR | Status: AC | PRN
  Administered 2014-08-31 (×2): 25 ug via INTRAVENOUS

## 2014-08-31 MED ORDER — MIDAZOLAM HCL 2 MG/2ML IJ SOLN
INTRAMUSCULAR | Status: AC | PRN
  Administered 2014-08-31: 1 mg via INTRAVENOUS
  Administered 2014-08-31: 0.5 mg via INTRAVENOUS

## 2014-08-31 MED ORDER — FENTANYL CITRATE 0.05 MG/ML IJ SOLN
INTRAMUSCULAR | Status: AC
  Filled 2014-08-31: qty 4

## 2014-08-31 MED ORDER — SODIUM CHLORIDE 0.9 % IV SOLN
INTRAVENOUS | Status: DC
  Administered 2014-08-31: 12:00:00 via INTRAVENOUS

## 2014-08-31 MED ORDER — MIDAZOLAM HCL 2 MG/2ML IJ SOLN
INTRAMUSCULAR | Status: AC
  Filled 2014-08-31: qty 6

## 2014-08-31 MED ORDER — HEPARIN SOD (PORK) LOCK FLUSH 100 UNIT/ML IV SOLN
INTRAVENOUS | Status: AC | PRN
  Administered 2014-08-31: 500 [IU]

## 2014-08-31 MED ORDER — HEPARIN SOD (PORK) LOCK FLUSH 100 UNIT/ML IV SOLN
INTRAVENOUS | Status: AC
  Filled 2014-08-31: qty 5

## 2014-08-31 MED ORDER — CEFAZOLIN SODIUM-DEXTROSE 2-3 GM-% IV SOLR
2.0000 g | Freq: Once | INTRAVENOUS | Status: AC
  Administered 2014-08-31: 2 g via INTRAVENOUS

## 2014-08-31 MED ORDER — LIDOCAINE HCL 1 % IJ SOLN
INTRAMUSCULAR | Status: AC
  Filled 2014-08-31: qty 20

## 2014-08-31 MED ORDER — IOHEXOL 300 MG/ML  SOLN
80.0000 mL | Freq: Once | INTRAMUSCULAR | Status: AC | PRN
  Administered 2014-08-31: 80 mL via INTRAVENOUS

## 2014-08-31 NOTE — Discharge Instructions (Signed)
Leave dressing on for 24 hours.  You may shower after 24 hours.  Please remove the dressing before you shower.   ° °Conscious Sedation, Adult, Care After °Refer to this sheet in the next few weeks. These instructions provide you with information on caring for yourself after your procedure. Your health care provider may also give you more specific instructions. Your treatment has been planned according to current medical practices, but problems sometimes occur. Call your health care provider if you have any problems or questions after your procedure. °WHAT TO EXPECT AFTER THE PROCEDURE  °After your procedure: °· You may feel sleepy, clumsy, and have poor balance for several hours. °· Vomiting may occur if you eat too soon after the procedure. °HOME CARE INSTRUCTIONS °· Do not participate in any activities where you could become injured for at least 24 hours. Do not: °¨ Drive. °¨ Swim. °¨ Ride a bicycle. °¨ Operate heavy machinery. °¨ Cook. °¨ Use power tools. °¨ Climb ladders. °¨ Work from a high place. °· Do not make important decisions or sign legal documents until you are improved. °· If you vomit, drink water, juice, or soup when you can drink without vomiting. Make sure you have little or no nausea before eating solid foods. °· Only take over-the-counter or prescription medicines for pain, discomfort, or fever as directed by your health care provider. °· Make sure you and your family fully understand everything about the medicines given to you, including what side effects may occur. °· You should not drink alcohol, take sleeping pills, or take medicines that cause drowsiness for at least 24 hours. °· If you smoke, do not smoke without supervision. °· If you are feeling better, you may resume normal activities 24 hours after you were sedated. °· Keep all appointments with your health care provider. °SEEK MEDICAL CARE IF: °· Your skin is pale or bluish in color. °· You continue to feel nauseous or vomit. °· Your  pain is getting worse and is not helped by medicine. °· You have bleeding or swelling. °· You are still sleepy or feeling clumsy after 24 hours. °SEEK IMMEDIATE MEDICAL CARE IF: °· You develop a rash. °· You have difficulty breathing. °· You develop any type of allergic problem. °· You have a fever. °MAKE SURE YOU: °· Understand these instructions. °· Will watch your condition. °· Will get help right away if you are not doing well or get worse. °Document Released: 02/25/2013 Document Reviewed: 02/25/2013 °ExitCare® Patient Information ©2015 [employer]. This information is not intended to replace advice given to you by your health care provider. Make sure you discuss any questions you have with your health care provider. ° °Implanted Port Insertion, Care After °Refer to this sheet in the next few weeks. These instructions provide you with information on caring for yourself after your procedure. Your health care provider may also give you more specific instructions. Your treatment has been planned according to current medical practices, but problems sometimes occur. Call your health care provider if you have any problems or questions after your procedure. °WHAT TO EXPECT AFTER THE PROCEDURE °After your procedure, it is typical to have the following:  °· Discomfort at the port insertion site. Ice packs to the area will help. °· Bruising on the skin over the port. This will subside in 3-4 days. °HOME CARE INSTRUCTIONS °· After your port is placed, you will get a manufacturer's information card. The card has information about your port. Keep this card with you   at all times.   °· Know what kind of port you have. There are many types of ports available.   °· Wear a medical alert bracelet in case of an emergency. This can help alert health care workers that you have a port.   °· The port can stay in for as long as your health care provider believes it is necessary.   °· A home health care nurse may give medicines and  take care of the port.   °· You or a family member can get special training and directions for giving medicine and taking care of the port at home.   °SEEK MEDICAL CARE IF:  °· Your port does not flush or you are unable to get a blood return.   °· You have a fever or chills. °SEEK IMMEDIATE MEDICAL CARE IF: °· You have new fluid or pus coming from your incision.   °· You notice a bad smell coming from your incision site.   °· You have swelling, pain, or more redness at the incision or port site.   °· You have chest pain or shortness of breath. °Document Released: 02/25/2013 Document Revised: 05/12/2013 Document Reviewed: 02/25/2013 °ExitCare® Patient Information ©2015 [employer]. This information is not intended to replace advice given to you by your health care provider. Make sure you discuss any questions you have with your health care provider. °Implanted Port Home Guide °An implanted port is a type of central line that is placed under the skin. Central lines are used to provide IV access when treatment or nutrition needs to be given through a person's veins. Implanted ports are used for long-term IV access. An implanted port may be placed because:  °· You need IV medicine that would be irritating to the small veins in your hands or arms.   °· You need long-term IV medicines, such as antibiotics.   °· You need IV nutrition for a long period.   °· You need frequent blood draws for lab tests.   °· You need dialysis.   °Implanted ports are usually placed in the chest area, but they can also be placed in the upper arm, the abdomen, or the leg. An implanted port has two main parts:  °· Reservoir. The reservoir is round and will appear as a small, raised area under your skin. The reservoir is the part where a needle is inserted to give medicines or draw blood.   °· Catheter. The catheter is a thin, flexible tube that extends from the reservoir. The catheter is placed into a large vein. Medicine that is inserted into  the reservoir goes into the catheter and then into the vein.   °HOW WILL I CARE FOR MY INCISION SITE? °Do not get the incision site wet. Bathe or shower as directed by your health care provider.  °HOW IS MY PORT ACCESSED? °Special steps must be taken to access the port:  °· Before the port is accessed, a numbing cream can be placed on the skin. This helps numb the skin over the port site.   °· Your health care provider uses a sterile technique to access the port. °· Your health care provider must put on a mask and sterile gloves. °· The skin over your port is cleaned carefully with an antiseptic and allowed to dry. °· The port is gently pinched between sterile gloves, and a needle is inserted into the port. °· Only "non-coring" port needles should be used to access the port. Once the port is accessed, a blood return should be checked. This helps ensure that the port is in   the vein and is not clogged.   °· If your port needs to remain accessed for a constant infusion, a clear (transparent) bandage will be placed over the needle site. The bandage and needle will need to be changed every week, or as directed by your health care provider.   °· Keep the bandage covering the needle clean and dry. Do not get it wet. Follow your health care provider's instructions on how to take a shower or bath while the port is accessed.   °· If your port does not need to stay accessed, no bandage is needed over the port.   °WHAT IS FLUSHING? °Flushing helps keep the port from getting clogged. Follow your health care provider's instructions on how and when to flush the port. Ports are usually flushed with saline solution or a medicine called heparin. The need for flushing will depend on how the port is used.  °· If the port is used for intermittent medicines or blood draws, the port will need to be flushed:   °· After medicines have been given.   °· After blood has been drawn.   °· As part of routine maintenance.   °· If a constant  infusion is running, the port may not need to be flushed.   °HOW LONG WILL MY PORT STAY IMPLANTED? °The port can stay in for as long as your health care provider thinks it is needed. When it is time for the port to come out, surgery will be done to remove it. The procedure is similar to the one performed when the port was put in.  °WHEN SHOULD I SEEK IMMEDIATE MEDICAL CARE? °When you have an implanted port, you should seek immediate medical care if:  °· You notice a bad smell coming from the incision site.   °· You have swelling, redness, or drainage at the incision site.   °· You have more swelling or pain at the port site or the surrounding area.   °· You have a fever that is not controlled with medicine. °Document Released: 05/07/2005 Document Revised: 02/25/2013 Document Reviewed: 01/12/2013 °ExitCare® Patient Information ©2015 [employer]. This information is not intended to replace advice given to you by your health care provider. Make sure you discuss any questions you have with your health care provider. ° °

## 2014-08-31 NOTE — Procedures (Signed)
Interventional Radiology Procedure Note  Procedure: Placement of a right IJ approach single lumen PowerPort.  Tip is positioned at the superior cavoatrial junction and catheter is ready for immediate use.  Complications: No immediate Recommendations:  - Ok to shower tomorrow - Routine line care   Amber Bishop, M.D Pager:  [phone]   

## 2014-08-31 NOTE — Telephone Encounter (Signed)
Met with patient at Short Stay as she was waiting for her [facility] appointment. Provided her and significant other copy of dental and social worker appointments and to take the Amoxicillin at 0930. Provided them with directions to the [facility]. She will pick up med after her procedure today. Also made note to apply her EMLA cream 1-2 hours prior to her chemo appointment.

## 2014-08-31 NOTE — Telephone Encounter (Signed)
Confirmed with Dr. Thane Edu office they will do her dental cleaning on 09/02/14- 1. Arrive at 10:15 for a 10:30 cleaning 2. Take antibiotic at 9:30 Thursday morning (Amoxicillin 500 mg #4 tabs X 1 dose) 3. Pick up script today at [facility] (building on other side of hospital). Dr. Maudie Flakes is calling it in now.  Social worker, Loren Racer will meet with patient on 09/01/14 @ 2 pm to help prepare her Living Will and Healthcare POA.  Will take copy of this note to patient to [place] Stay when she is here for [facility] placement.

## 2014-08-31 NOTE — H&P (Signed)
Chief Complaint: "I'm here for a port a cath"  Referring Physician(s): Feng,Yan  History of Present Illness: Amber Bishop is a 64 y.o. female with history of thrombocytopenia, diabetes, and newly  diagnosed metastatic pancreatic carcinoma who presents today for Port-A-Cath placement for chemotherapy.  Past Medical History  Diagnosis Date  . Anxiety   . Hypertension   . IBS (irritable bowel syndrome)   . Hyperplastic colon polyp   . Allergy   . Anemia   . Arthritis   . Diverticulosis   . Dilated aortic root     seen on prior echo but echo 05/2013 showed normal dimensions  . Bicuspid aortic valve   . LVE (left ventricular enlargement)     mild by echo 1/15 with EF 50-55%    Past Surgical History  Procedure Laterality Date  . Tubal ligation      age 54  . Colonoscopy    . Polypectomy    . Tooth extraction      3 teeth  . Belpharoptosis repair      Allergies: Naprosyn and Metformin and related  Medications: Prior to Admission medications   Medication Sig Start Date End Date Taking? Authorizing Provider  ALPRAZolam (XANAX) 0.5 MG tablet TAKE ONE TABLET BY MOUTH IN THE MORNING AND TAKE ONE-HALF TABLET AT LUNCH Patient taking differently: TAKE 1 TABLET THREE TIMES DAILY IF NEEDED FOR ANXIETY 08/03/14  Yes Lafayette Dragon, MD  amLODipine (NORVASC) 5 MG tablet Take 5 mg by mouth every morning.  03/16/14  Yes Historical Provider, MD  Calcium Carbonate-Vitamin D (CALCIUM 600/VITAMIN D) 600-400 MG-UNIT per tablet Take 1 tablet by mouth 2 (two) times daily.    Yes Historical Provider, MD  cetirizine (ZYRTEC) 10 MG tablet Take 10 mg by mouth daily.   Yes Historical Provider, MD  dextromethorphan (DELSYM) 30 MG/5ML liquid Take 15 mg by mouth 2 (two) times daily as needed for cough.   Yes Historical Provider, MD  FLUoxetine (PROZAC) 20 MG capsule Take 20 mg by mouth every morning.    Yes Historical Provider, MD  insulin aspart (NOVOLOG FLEXPEN) 100 UNIT/ML FlexPen Inject 14-18  units into the skin 3 times daily as instructed. Patient taking differently: Inject 20-24 Units into the skin 3 (three) times daily with meals. 80-99 20units 100-199 21 units 200-299 22 units  300 or greater 24 units 07/30/14  Yes Philemon Kingdom, MD  Insulin Degludec (TRESIBA FLEXTOUCH [place]) Inject 44-52 Units into the skin at bedtime. Pt dose is 52 units at bedtime but will take 44 units if sugar is too low.   Yes Historical Provider, MD  meloxicam (MOBIC) 15 MG tablet Take 15 mg by mouth every morning.    Yes Historical Provider, MD  Multiple Vitamins-Minerals (CENTRUM SILVER PO) Take 1 capsule by mouth every morning.    Yes Historical Provider, MD  Multiple Vitamins-Minerals (ICAPS MV PO) Take 1 capsule by mouth 2 (two) times daily.    Yes Historical Provider, MD  Omega-3 Fatty Acids (FISH OIL) 1000 MG CAPS Take 1 capsule by mouth every morning.    Yes Historical Provider, MD  PREVNAR 13 SUSP injection Inject 0.5 mLs into the muscle once.  07/15/14  Yes Historical Provider, MD  valsartan-hydrochlorothiazide (DIOVAN-HCT) 320-12.5 MG per tablet Take 1 tablet by mouth every morning.  03/16/14  Yes Historical Provider, MD  blood glucose meter kit and supplies KIT Dispense based on patient and insurance preference. Use up to four times daily as directed. Dx code:  E11.65 07/15/14   Philemon Kingdom, MD  cholestyramine Lucrezia Starch) 4 G packet Take 1/2-1 packet dissolved in water/juice once daily....................Marland KitchenPRN Patient taking differently: Take 2-4 g by mouth daily as needed (diarrhea). Take 1/2-1 packet dissolved in water/juice once daily....................Marland KitchenPRN 01/24/11   Lafayette Dragon, MD  glucose blood (ACCU-CHEK AVIVA PLUS) test strip Test 4 times daily as instructed. 06/28/14   Philemon Kingdom, MD  Glucose Blood (BLOOD GLUCOSE TEST STRIPS) STRP Use to test blood sugar 4 times daily as instructed. Dx code: E11.65 08/03/14   Philemon Kingdom, MD  Insulin Pen Needle (VALUMARK PEN NEEDLES) 31G X 8 MM MISC  Use to inject insulin 4 times daily as instructed. 03/18/14   Philemon Kingdom, MD  LANCETS ULTRA THIN 30G MISC Use to test blood sugar 4 times daily as instructed. Dx code: E11.65 07/15/14   Philemon Kingdom, MD  lidocaine-prilocaine (EMLA) cream Apply 1 application topically as needed. 08/30/14   Truitt Merle, MD    Family History  Problem Relation Age of Onset  . Colon cancer Neg Hx   . Esophageal cancer Neg Hx   . Stomach cancer Neg Hx   . Asthma Mother     History   Social History  . Marital Status: Single    Spouse Name: N/A  . Number of Children: N/A  . Years of Education: N/A   Occupational History  . Retired    Social History Main Topics  . Smoking status: Never Smoker   . Smokeless tobacco: Never Used  . Alcohol Use: No  . Drug Use: No  . Sexual Activity: Not on file   Other Topics Concern  . 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  Respiratory: Negative for shortness of breath.        Occ cough  Cardiovascular: Negative for chest pain.  Gastrointestinal: Negative for vomiting, abdominal pain and blood in stool.       Occ nausea  Genitourinary: Negative for dysuria and hematuria.  Musculoskeletal: Positive for back pain.  Neurological: Negative for headaches.  Psychiatric/Behavioral: The patient is nervous/anxious.     Vital Signs: BP 164/89 mmHg  Pulse 74  Temp(Src) 98.6 F (37 C) (Oral)  Resp 18  SpO2 99%  Physical Exam  Constitutional: She is oriented to person, place, and time. She appears well-developed and well-nourished.  Pt stutters  Cardiovascular: Normal rate and regular rhythm.   Pulmonary/Chest: Effort normal and breath sounds normal.  Abdominal: Soft. Bowel sounds are normal.  obese  Musculoskeletal: Normal range of motion. She  exhibits edema.  Neurological: She is alert and oriented to person, place, and time.    Imaging: Ct Abdomen Pelvis W Wo Contrast  08/10/2014   CLINICAL DATA:  Abnormal ultrasound examination showing multiple liver lesions and fatty infiltration.  EXAM: CT ABDOMEN AND PELVIS WITHOUT AND WITH CONTRAST  TECHNIQUE: Multidetector CT imaging of the abdomen and pelvis was performed following the standard protocol before and following the bolus administration of intravenous contrast.  CONTRAST:  125 cc Isovue-300  COMPARISON:  Abdominal ultrasound 08/03/2014  FINDINGS: Lower chest: The lung bases are clear of acute process. No definite pulmonary nodules. The heart is normal in size. No pericardial effusion. The distal esophagus is grossly normal.  Hepatobiliary: There is a very  geographic pattern of fatty infiltration of the liver which is much more pronounced in the right hepatic lobe. There are also numerous metastatic lesions involving both lobes of the liver and area is left-sided biliary dilatation. The left-sided biliary dilatation is likely due to a caudate metastasis compressing the left common hepatic duct.  Pancreas: There is a 5.4 x 3.2 cm mass in the pancreatic body accounting for the hepatic metastatic disease. There is also celiac axis lymphadenopathy. The pancreatic head and uncinate process appear normal. I do not see any common bile duct dilatation. The pancreatic duct is obstructed in the tail region by the body mass. The pancreatic tail demonstrates associated fairly significant atrophy.  Spleen: Normal size.  No focal lesions.  Adrenals/Urinary Tract: The adrenal glands and kidneys are normal. There is a simple right lower pole renal cyst.  Stomach/Bowel: The stomach, duodenum, small bowel and colon are unremarkable. No inflammatory changes, mass lesions or obstructive findings. Moderate diverticulosis involving the upper sigmoid colon. Moderate stool throughout the colon may suggest constipation.  The appendix is normal. The terminal ileum is normal.  Vascular/Lymphatic: The aorta and branch vessels are patent. The major venous structures are patent except for the splenic vein which is occluded in its midportion likely due to the pancreatic tumor. No retroperitoneal lymphadenopathy. No pelvic lymphadenopathy.  Other: The uterus demonstrates fibroids. The ovaries are normal. No pelvic mass or adenopathy. The bladder appears normal. No inguinal mass or adenopathy.  Musculoskeletal: No significant bony findings. Degenerative changes are noted in the spine. A central disc protrusion is noted at L1-2 and there is moderate spinal and lateral recess stenosis at L2-3.  IMPRESSION: 1. 5.4 x 3.2 cm pancreatic body mass consistent with pancreatic adenocarcinoma. There is adjacent lymphadenopathy and diffuse hepatic metastatic disease. The portal vein is patent. 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 2. Very geographic fatty infiltration of the liver. 3. Left-sided biliary dilatation. There is likely meta tumor compressing the left hepatic duct. 4. Uterine fibroids.   Electronically Signed   By: Marijo Sanes M.D.   On: 08/10/2014 14:14   US Abdomen Limited  08/03/2014   CLINICAL DATA:  Elevated LFT  EXAM: US ABDOMEN LIMITED - RIGHT UPPER QUADRANT  COMPARISON:  None.  FINDINGS: Gallbladder:  No gallstones or wall thickening visualized. No sonographic Murphy sign noted.  Common bile duct:  Diameter: 4.5 mm  Liver:  Multiple hypoechoic liver lesions are seen in the right and left lobe. The largest lesion measures 3 cm in the right lobe of the liver centrally. Additional smaller liver lesions are identified. These do not appear to be hepatic cysts. Background hepatic steatosis noted.  Incidental right renal cyst 5 cm  IMPRESSION: Negative for gallstones  Multiple ill-defined hypoechoic liver lesions throughout the liver. Differential diagnosis includes hepatic metastatic disease.  There is underlying fatty infiltration of the liver which is probably unrelated to the liver lesions. Further imaging evaluation is suggested to exclude malignancy. CT abdomen pelvis with contrast recommended for further evaluation   Electronically Signed   By: Franchot Gallo M.D.   On: 08/03/2014 09:38   US Biopsy  08/17/2014   CLINICAL DATA:  64 year old female with pancreatic mass and multiple hepatic lesions concerning for metastatic pancreatic cancer.  EXAM: ULTRASOUND BIOPSY CORE LIVER  Date: 08/17/2014  PROCEDURE: 1. Ultrasound-guided liver biopsy Interventional Radiologist:  Criselda Peaches, MD  ANESTHESIA/SEDATION: Moderate (conscious) sedation was used. 3 mg Versed, 50 mcg Fentanyl were administered  intravenously. The patient's vital signs were monitored continuously by radiology nursing throughout the procedure.  Sedation Time: 14 minutes  MEDICATIONS: None additional  TECHNIQUE: Informed consent was obtained from the patient following explanation of the procedure, risks, benefits and alternatives. The patient understands, agrees and consents for the procedure. All questions were addressed. A time out was performed.  The right upper quadrant was interrogated with ultrasound. A suitable hepatic lesion was identified. The skin was appropriately marked. The region was then sterilely prepped and draped in standard fashion using Betadine skin prep.  Local anesthesia was attained by infiltration with 1% lidocaine. A small dermatotomy was made. Under real-time sonographic guidance, a 19 gauge introducer needle was advanced into the liver and positioned at the margin of the nodule. Multiple 18 gauge core biopsies were then coaxially obtained using the BioPince automated biopsy device. The needle was confirmed within the the lesion on all biopsy passes.  Biopsy specimens were placed in formalin and delivered to pathology for further evaluation. As the introducer needle was removed, the biopsy tract was  embolized with a Gel-Foam slurry. The patient tolerated the procedure well.  COMPLICATIONS: None  IMPRESSION: Technically successful ultrasound-guided core biopsy of hepatic lesion.  Signed,  Criselda Peaches, MD  [facility]  [facility]   Electronically Signed   By: Jacqulynn Cadet M.D.   On: 08/17/2014 17:22    Labs:  CBC:  Recent Labs  08/17/14 1010 08/24/14 1328 08/31/14 1210  WBC 5.7 6.1 6.6  HGB 14.2 14.0 13.0  HCT 43.5 42.6 40.3  PLT 85* 86* 98*    COAGS:  Recent Labs  08/17/14 1010  INR 1.08  APTT 28    BMP:  Recent Labs  05/05/14 0818 08/24/14 1328  NA 138 140  K 3.6 3.9  CL 105  --   CO2 28 29  GLUCOSE 245* 366*  BUN 13 18.8  CALCIUM 8.8 8.9  CREATININE 0.8 1.0    LIVER FUNCTION TESTS:  Recent Labs  05/05/14 0818 08/24/14 1328  BILITOT 0.5 0.98  AST 18 74*  ALT 25 96*  ALKPHOS 68 212*  PROT 6.4 6.6  ALBUMIN 3.8 3.7    TUMOR MARKERS:  Recent Labs  08/24/14 1328  CA199 >140000.0*    Assessment and Plan: AMERIE BEAUMONT is a 64 y.o. female with history of thrombocytopenia (98k today), diabetes, and newly  diagnosed metastatic pancreatic carcinoma who presents today for Port-A-Cath placement for chemotherapy.Risks and benefits discussed with the patient/boyfriend including, but not limited to bleeding, infection, pneumothorax, or fibrin sheath development and need for additional procedures. All of the patient's questions were answered, patient is agreeable to proceed. Consent signed and in chart.       Signed: Autumn Messing 08/31/2014, 12:54 PM   I spent a total of 20 minutes face to face in clinical consultation, greater than 50% of which was counseling/coordinating care for Port-A-Cath placement

## 2014-09-01 ENCOUNTER — Telehealth: Payer: Self-pay | Admitting: *Deleted

## 2014-09-01 ENCOUNTER — Encounter: Payer: Self-pay | Admitting: *Deleted

## 2014-09-01 NOTE — Progress Notes (Signed)
[place] Work  Clinical Social Work spoke with pt's son via phone. Pt has appt today with attorney to get her will and durable power of attorney in place. She may possibly complete ADRs as well. CSW plans to see pt at her appt on 09/02/14 at 1pm to review role of CSW and complete ADRs if not completed at attorney's office. Son aware and stated understanding.  Loren Racer, [place] Worker [place]  [place] Phone: [phone] Fax: [phone]

## 2014-09-01 NOTE — Telephone Encounter (Signed)
Called to ask if OK to leave her PAC site open to air now that it has been 24 hours? Confirmed as long as there is no opening in suture line, OK to leave open to air. Reminded her to apply her EMLA cream with Press and Seal tomorrow at 12:30 to numb site. Instructed her how to find the port by palpation. Confirmed her appointment times tomorrow and that her son will be present. Verbalizes that she is very anxious. Made her aware this is normal and she has had a lot going on in a short period of time. Once treatment gets going, things will be calmer.

## 2014-09-02 ENCOUNTER — Encounter: Payer: Self-pay | Admitting: *Deleted

## 2014-09-02 ENCOUNTER — Telehealth: Payer: Self-pay | Admitting: Hematology

## 2014-09-02 ENCOUNTER — Telehealth: Payer: Self-pay | Admitting: *Deleted

## 2014-09-02 ENCOUNTER — Encounter: Payer: Self-pay | Admitting: Hematology

## 2014-09-02 ENCOUNTER — Ambulatory Visit (HOSPITAL_BASED_OUTPATIENT_CLINIC_OR_DEPARTMENT_OTHER): Payer: PPO | Admitting: Hematology

## 2014-09-02 ENCOUNTER — Ambulatory Visit (HOSPITAL_BASED_OUTPATIENT_CLINIC_OR_DEPARTMENT_OTHER): Payer: PPO

## 2014-09-02 ENCOUNTER — Other Ambulatory Visit: Payer: Self-pay | Admitting: *Deleted

## 2014-09-02 VITALS — BP 130/76 | HR 78 | Temp 97.3°F | Resp 18 | Ht 64.5 in | Wt 217.3 lb

## 2014-09-02 DIAGNOSIS — C787 Secondary malignant neoplasm of liver and intrahepatic bile duct: Secondary | ICD-10-CM

## 2014-09-02 DIAGNOSIS — C259 Malignant neoplasm of pancreas, unspecified: Secondary | ICD-10-CM

## 2014-09-02 DIAGNOSIS — D696 Thrombocytopenia, unspecified: Secondary | ICD-10-CM | POA: Diagnosis not present

## 2014-09-02 DIAGNOSIS — Z5111 Encounter for antineoplastic chemotherapy: Secondary | ICD-10-CM

## 2014-09-02 MED ORDER — LEUCOVORIN CALCIUM INJECTION 350 MG
400.0000 mg/m2 | Freq: Once | INTRAMUSCULAR | Status: AC
  Administered 2014-09-02: 848 mg via INTRAVENOUS
  Filled 2014-09-02: qty 42.4

## 2014-09-02 MED ORDER — PROCHLORPERAZINE MALEATE 10 MG PO TABS: 10.0000 mg | ORAL_TABLET | Freq: Four times a day (QID) | ORAL | Status: DC | PRN

## 2014-09-02 MED ORDER — SODIUM CHLORIDE 0.9 % IV SOLN
Freq: Once | INTRAVENOUS | Status: AC
  Administered 2014-09-02: 13:00:00 via INTRAVENOUS
  Filled 2014-09-02: qty 4

## 2014-09-02 MED ORDER — OXALIPLATIN CHEMO INJECTION 100 MG/20ML
70.0000 mg/m2 | Freq: Once | INTRAVENOUS | Status: AC
  Administered 2014-09-02: 150 mg via INTRAVENOUS
  Filled 2014-09-02: qty 30

## 2014-09-02 MED ORDER — DEXTROSE 5 % IV SOLN
Freq: Once | INTRAVENOUS | Status: AC
  Administered 2014-09-02: 14:00:00 via INTRAVENOUS

## 2014-09-02 MED ORDER — SODIUM CHLORIDE 0.9 % IV SOLN
2000.0000 mg/m2 | INTRAVENOUS | Status: DC
  Administered 2014-09-02: 4250 mg via INTRAVENOUS
  Filled 2014-09-02: qty 85

## 2014-09-02 MED ORDER — ONDANSETRON HCL 8 MG PO TABS: 8.0000 mg | ORAL_TABLET | Freq: Two times a day (BID) | ORAL | Status: DC

## 2014-09-02 NOTE — [facility] Oncology Navigator Note (Signed)
Met with patient during initial chemotherapy treatment to provide support and assess for needs to promote continuity of care. Reviewed the following... 1. Antiemetic regimen 2. Close monitoring of glucose and call if consistently > 300 3. Procedure for Saturday pump D/C Will call tomorrow and check on her.  Merceda Elks, RN, BSN GI On[facility] [place]

## 2014-09-02 NOTE — Telephone Encounter (Signed)
Called pt with instructions to arrive at [facility] at 1130 today. Will need to begin her infusion earlier due to pump disconnect on Sat. She voiced concern about applying EMLA cream to port site prior to access. Assured her nursing staff will be available to assist. She voiced understanding. Agrees to come in sooner for treatment, she will call her son who is providing transportation.

## 2014-09-02 NOTE — Progress Notes (Signed)
Okay per Dr. Burr Medico to tx with platelets 98

## 2014-09-02 NOTE — Telephone Encounter (Signed)
Gave calendar for April/May.

## 2014-09-02 NOTE — Progress Notes (Signed)
[place]  [phone]) [phone] [phone]) [phone]  Clinic New Consult Note   Patient Care Team: Harlan Stains, MD as PCP - General (Family Medicine) Truitt Merle, MD as Consulting Physician (Hematology and Oncology) Delrae Rend, MD as Consulting Physician (Endocrinology) 09/02/2014  CHIEF COMPLAINTS/PURPOSE OF CONSULTATION:  Thrombocytopenia and a newly diagnosed metastatic pancreas cancer  Oncology History   Pancreatic cancer metastasized to liver   Staging form: Pancreas, AJCC 7th Edition     Clinical: Stage IV (T2, NX, M1) - Unsigned       Pancreatic cancer metastasized to liver   08/10/2014 Imaging CT abdomen showed 5.4 x 3.2 cm pancreatic body mass consistent with pancreatic adenocarcinoma. There is adjacent lymphadenopathy and diffuse  hepatic metastatic disease.   08/17/2014 Pathology Results Liver biopsy showed adenocarcinoma, consistent with pancreatic primary.   08/17/2014 Initial Diagnosis Pancreatic cancer metastasized to liver   08/24/2014 Tumor Marker CA19.9 >140000    HISTORY OF PRESENTING ILLNESS:  Amber Bishop 64 y.o. female was referred by her primary care physician Dr. Dema Severin for thrombocytopenia. She also was recently diagnosed with metastatic pancreatic cancer.   She has had a chronic thrombocytopenia since 2013. Her plt count has been in the range of 100-150, but dropped to the range of 80s lately. She denies any signs of bleeding. No history of liver disease.  She has been diabetic for 24 years, and her blood glucose has been out of control in the past one year. She also reports fatigue in the past one year. She denies any significant abdominal pain, jaundice, bloating, or change of her bowel habits. Her appetite has been normal. No nausea or vomiting. She is able to do all selfcare, but she sleeps more than usually during the day. No abdominal pain or bloating. She was found to have abnormal LFTs on routine lab work, and her PCP Dr. Dema Severin,  who ordered and abdominal US which lead to a CT scan, which showed a 5.4 x 3.4 cm pancreatic body mass, adjacent lymphadenopathy, and diffuse hepatic metastasis. She underwent ultrasound-guided liver biopsy on 08/17/2014, and the biopsy showed adenocarcinoma consistent with pancreatic primary.   INTERIM HISTORY: [name] returns for follow-up. She had a port placement earlier this week. She feels well overall and denies any new symptoms. No abdominal pain, nausea, bloating. Her appetite is moderate, eats fairly well.  MEDICAL HISTORY:  Past Medical History  Diagnosis Date  . Anxiety   . Hypertension   . IBS (irritable bowel syndrome)   . Hyperplastic colon polyp   . Allergy   . Anemia   . Arthritis   . Diverticulosis   . Dilated aortic root     seen on prior echo but echo 05/2013 showed normal dimensions  . Bicuspid aortic valve   . LVE (left ventricular enlargement)     mild by echo 1/15 with EF 50-55%  . pancreatic ca w/ liver mets dx'd 07/2014    SURGICAL HISTORY: Past Surgical History  Procedure Laterality Date  . Tubal ligation      age 14  . Colonoscopy    . Polypectomy    . Tooth extraction      3 teeth  . Belpharoptosis repair      SOCIAL HISTORY: History   Social History  . Marital Status: Single    Spouse Name: N/A  . Number of Children: 1 son   . Years of Education: N/A   Occupational History  . Retired home care [employer]  Social History Main Topics  . Smoking status: Never Smoker   . Smokeless tobacco: Never Used  . Alcohol Use: No  . Drug Use: No  . Sexual Activity: Not on file   Other Topics Concern  . Not on file   Social History Narrative    FAMILY HISTORY: Family History  Problem Relation Age of Onset  . Colon cancer Neg Hx   . Esophageal cancer Neg Hx   . Stomach cancer Neg Hx   . Asthma Mother     ALLERGIES:  is allergic to naprosyn and metformin and related.  MEDICATIONS:  Current Outpatient Prescriptions  Medication Sig Dispense  Refill  . ALPRAZolam (XANAX) 0.5 MG tablet TAKE ONE TABLET BY MOUTH IN THE MORNING AND TAKE ONE-HALF TABLET AT LUNCH (Patient taking differently: TAKE 1 TABLET THREE TIMES DAILY IF NEEDED FOR ANXIETY) 60 tablet 0  . amLODipine (NORVASC) 5 MG tablet Take 5 mg by mouth every morning.     . blood glucose meter kit and supplies KIT Dispense based on patient and insurance preference. Use up to four times daily as directed. Dx code: E11.65 1 each 0  . Calcium Carbonate-Vitamin D (CALCIUM 600/VITAMIN D) 600-400 MG-UNIT per tablet Take 1 tablet by mouth 2 (two) times daily.     . cetirizine (ZYRTEC) 10 MG tablet Take 10 mg by mouth daily.    . cholestyramine (QUESTRAN) 4 G packet Take 1/2-1 packet dissolved in water/juice once daily....................Marland KitchenPRN (Patient taking differently: Take 2-4 g by mouth daily as needed (diarrhea). Take 1/2-1 packet dissolved in water/juice once daily.....................PRN) 90 each 3  . dextromethorphan (DELSYM) 30 MG/5ML liquid Take 15 mg by mouth 2 (two) times daily as needed for cough.    Marland Kitchen FLUoxetine (PROZAC) 20 MG capsule Take 20 mg by mouth every morning.     Marland Kitchen glucose blood (ACCU-CHEK AVIVA PLUS) test strip Test 4 times daily as instructed. 125 each 3  . Glucose Blood (BLOOD GLUCOSE TEST STRIPS) STRP Use to test blood sugar 4 times daily as instructed. Dx code: E11.65 125 each 5  . insulin aspart (NOVOLOG FLEXPEN) 100 UNIT/ML FlexPen Inject 14-18 units into the skin 3 times daily as instructed. (Patient taking differently: Inject 20-24 Units into the skin 3 (three) times daily with meals. 80-99 20units 100-199 21 units 200-299 22 units  300 or greater 24 units) 15 mL 2  . Insulin Degludec (TRESIBA FLEXTOUCH [place]) Inject 44-52 Units into the skin at bedtime. Pt dose is 52 units at bedtime but will take 44 units if sugar is too low.    . Insulin Pen Needle (VALUMARK PEN NEEDLES) 31G X 8 MM MISC Use to inject insulin 4 times daily as instructed. 150 each 3  . LANCETS ULTRA  THIN 30G MISC Use to test blood sugar 4 times daily as instructed. Dx code: E11.65 200 each 2  . lidocaine-prilocaine (EMLA) cream Apply 1 application topically as needed. 30 g 3  . meloxicam (MOBIC) 15 MG tablet Take 15 mg by mouth every morning.     . Multiple Vitamins-Minerals (CENTRUM SILVER PO) Take 1 capsule by mouth every morning.     . Multiple Vitamins-Minerals (ICAPS MV PO) Take 1 capsule by mouth 2 (two) times daily.     . Omega-3 Fatty Acids (FISH OIL) 1000 MG CAPS Take 1 capsule by mouth every morning.     Marland Kitchen PREVNAR 13 SUSP injection Inject 0.5 mLs into the muscle once.   0  . valsartan-hydrochlorothiazide (DIOVAN-HCT) 320-12.5 MG  per tablet Take 1 tablet by mouth every morning.      No current facility-administered medications for this visit.    REVIEW OF SYSTEMS:   Constitutional: Denies fevers, chills or abnormal night sweats Eyes: Denies blurriness of vision, double vision or watery eyes Ears, nose, mouth, throat, and face: Denies mucositis or sore throat Respiratory: Denies cough, dyspnea or wheezes Cardiovascular: Denies palpitation, chest discomfort or lower extremity swelling Gastrointestinal:  Denies nausea, heartburn or change in bowel habits Skin: Denies abnormal skin rashes Lymphatics: Denies new lymphadenopathy or easy bruising Neurological:Denies numbness, tingling or new weaknesses Behavioral/Psych: Mood is stable, no new changes  All other systems were reviewed with the patient and are negative.   PHYSICAL EXAMINATION: ECOG PERFORMANCE STATUS: 1 - Symptomatic but completely ambulatory  Filed Vitals:   09/02/14 1147  BP: 130/76  Pulse: 78  Temp: 97.3 F (36.3 C)  Resp: 18   Filed Weights   09/02/14 1147  Weight: 217 lb 4.8 oz (98.567 kg)    GENERAL:alert, no distress and comfortable SKIN: skin color, texture, turgor are normal, no rashes or significant lesions EYES: normal, conjunctiva are pink and non-injected, sclera clear OROPHARYNX:no  exudate, no erythema and lips, buccal mucosa, and tongue normal  NECK: supple, thyroid normal size, non-tender, without nodularity LYMPH:  no palpable lymphadenopathy in the cervical, axillary or inguinal LUNGS: clear to auscultation and percussion with normal breathing effort HEART: regular rate & rhythm and no murmurs and no lower extremity edema ABDOMEN:abdomen soft, non-tender and normal bowel sounds Musculoskeletal:no cyanosis of digits and no clubbing  PSYCH: alert & oriented x 3 with fluent speech NEURO: no focal motor/sensory deficits  LABORATORY DATA:  I have reviewed the data as listed Lab Results  Component Value Date   WBC 6.6 08/31/2014   HGB 13.0 08/31/2014   HCT 40.3 08/31/2014   MCV 86.7 08/31/2014   PLT 98* 08/31/2014    Recent Labs  05/05/14 0818 08/24/14 1328  NA 138 140  K 3.6 3.9  CL 105  --   CO2 28 29  GLUCOSE 245* 366*  BUN 13 18.8  CREATININE 0.8 1.0  CALCIUM 8.8 8.9  PROT 6.4 6.6  ALBUMIN 3.8 3.7  AST 18 74*  ALT 25 96*  ALKPHOS 68 212*  BILITOT 0.5 0.98    RADIOGRAPHIC STUDIES: I have personally reviewed the radiological images as listed and agreed with the findings in the report.  Ct Abdomen Pelvis W Wo Contrast 08/10/2014    IMPRESSION: 1. 5.4 x 3.2 cm pancreatic body mass consistent with pancreatic adenocarcinoma. There is adjacent lymphadenopathy and diffuse hepatic metastatic disease. The portal vein is patent. 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 2. Very geographic fatty infiltration of the liver. 3. Left-sided biliary dilatation. There is likely meta tumor compressing the left hepatic duct. 4. Uterine fibroids.   Electronically Signed   By: Marijo Sanes M.D.   On: 08/10/2014 14:14   CT chet 08/31/2014 IMPRESSION: No findings specific for metastatic disease in the chest.  3 mm right lower lobe pulmonary nodule favors a benign subpleural lymph node. Attention on follow-up is suggested.  3.1  x 4.6 cm mass in the pancreatic body, corresponding to known pancreatic cancer.  Multifocal hepatic metastases, incompletely visualized.     ASSESSMENT & PLAN:  64 year old Caucasian female, with past medical history of diabetes, hypertension, IBS, arthritis, who was found to have worsening thrombocytopenia, and a newly diagnosed metastatic rectal cancer.  1.  Pancreatic cancer with liver metastasis -I reviewed her CT chest, abdomen and pelvis scan findings in the liver mass biopsy results in great detail. The image was reviewed in person. -I discussed the incurable nature of metastatic pancreas cancer and overall poor prognosis.  -I recommend systemic chemotherapy. The goal of treatment is palliation and to prolong her life. -I discussed the option of single agent gemcitabine, gemcitabine with Abraxane, FOLFOX or FOLFIRINOX -Due to her significant some cytopenia, I think gemcitabine-based regimen would be difficult to tolerate. I recommend a FOLFOX as first line therapy. Side effects of chemotherapy therapy were discussed with patient and she agrees to proceed. -Giving her moderate some cytopenia, I'll dose reduce 50% for her first cycle and omit the 5 FU bolus. -Follow-up her CBC closely.  2. Thrombocytopenia -This appears to be chronic, she has no history of liver disease, CT scan did not reveal liver cirrhosis or spleen medically. 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 If her platelet count drops significantly after chemotherapy, I'll try a course of prednisone.  -As a bone marrow disease is also possible. Giving the overall poor prognosis from a metastatic pancreas cancer, I did not recommend a bone marrow biopsy at this point.  3. Diabetes, hypertension, IBS, anxiety -She will continue follow-up with her primary care physician -Her blood glucose has been poorly controlled lately, I recommend her monitoring her sugar closely at home and contact her primary care physician for her insulin dose  adjustment  Plan -First cycle FOLFOX today with 15% dose reduction and no 5-FU bolus -Return to clinic next week for follow-up and lab.  All questions were answered. The patient knows to call the clinic with any problems, questions or concerns. I spent 20 minutes counseling the patient face to face. The total time spent in the appointment was 30 minutes and more than 50% was on counseling.     Truitt Merle, MD 09/02/2014 12:16 PM

## 2014-09-02 NOTE — Progress Notes (Signed)
[place] Social Work  Clinical Social Work was referred by Therapist, sports  to review and complete healthcare advance directives.  Clinical Social Worker met with patient and son in the infusion room. Pt completed HCPOA and ADRs at attorney's office yesterday and brought them in.  The patient designated Maribeth Jiles as their primary healthcare agent and did not name a secondary agent.  Patient also completed healthcare living will.    Clinical Social Worker will send documents to medical records to be scanned into patient's chart. Clinical Social Worker encouraged patient/family to contact with any additional questions or concerns.  Loren Racer, [place] Worker [place]  [place] Phone: [phone] Fax: [phone]

## 2014-09-02 NOTE — Patient Instructions (Addendum)
[facility] Discharge Instructions for Patients Receiving Chemotherapy  Today you received the following chemotherapy agents: Leucovorin, Oxaliplatin, 5FU  To help prevent nausea and vomiting after your treatment, we encourage you to take your nausea medication as prescribed by your physician.  Zofran (ondansetron) 8 mg twice daily for 2 days-start tomorrow, then after 2 days take twice daily as needed   If you develop nausea and vomiting that is not controlled by your nausea medication, call the clinic.   BELOW ARE SYMPTOMS THAT SHOULD BE REPORTED IMMEDIATELY:  *FEVER GREATER THAN 100.5 F  *CHILLS WITH OR WITHOUT FEVER  NAUSEA AND VOMITING THAT IS NOT CONTROLLED WITH YOUR NAUSEA MEDICATION  *UNUSUAL SHORTNESS OF BREATH  *UNUSUAL BRUISING OR BLEEDING  TENDERNESS IN MOUTH AND THROAT WITH OR WITHOUT PRESENCE OF ULCERS  *URINARY PROBLEMS  *BOWEL PROBLEMS  UNUSUAL RASH Items with * indicate a potential emergency and should be followed up as soon as possible.  Feel free to call the clinic you have any questions or concerns. The clinic phone number is [phone].  Please show the [place] at check-in to the Emergency Department and triage nurse.

## 2014-09-02 NOTE — Telephone Encounter (Signed)
Per staff message and POF I have scheduled appts. Advised scheduler of appts. JMW  

## 2014-09-02 NOTE — Progress Notes (Signed)
Pt tolerated 1st folfox without difficulty. All questions and concerns answered before discharged. Pt aware to obtain Zofran from pharmacy, and voices understanding of how to take.

## 2014-09-02 NOTE — Telephone Encounter (Signed)
Received VM at 6 pm from pharmacy that Zofran not covered under her plan and needs a prior authorization. Will notify managed care. Patient has qualified for the [place] request collaborative nurse call in small quantity until her PA is done or if MD will order another antiemetic.

## 2014-09-03 ENCOUNTER — Telehealth: Payer: Self-pay | Admitting: *Deleted

## 2014-09-03 ENCOUNTER — Encounter: Payer: Self-pay | Admitting: *Deleted

## 2014-09-03 ENCOUNTER — Encounter: Payer: Self-pay | Admitting: Hematology

## 2014-09-03 NOTE — Telephone Encounter (Signed)
Reports she has no nausea/vomiting or diarrhea. No problem with infusion pump. Just feels very tired. Son returns home tomorrow. Reminded her that Saturday pump D/C, just come to infusion area-do not register on weekend. She understands and agrees. Appreciates call.

## 2014-09-03 NOTE — Progress Notes (Signed)
I faxed prior auth req to [employer] for Zofran

## 2014-09-03 NOTE — Progress Notes (Signed)
RECEIVED A FAX FROM [employer] CONCERNING A PRIOR AUTHORIZATION FOR ONDANSETRON. THIS REQUEST WAS PLACED IN THE MANAGED CARE BIN. 

## 2014-09-04 ENCOUNTER — Ambulatory Visit (HOSPITAL_BASED_OUTPATIENT_CLINIC_OR_DEPARTMENT_OTHER): Payer: PPO

## 2014-09-04 ENCOUNTER — Telehealth: Payer: Self-pay | Admitting: *Deleted

## 2014-09-04 DIAGNOSIS — C259 Malignant neoplasm of pancreas, unspecified: Secondary | ICD-10-CM | POA: Diagnosis not present

## 2014-09-04 DIAGNOSIS — C787 Secondary malignant neoplasm of liver and intrahepatic bile duct: Secondary | ICD-10-CM

## 2014-09-04 MED ORDER — HEPARIN SOD (PORK) LOCK FLUSH 100 UNIT/ML IV SOLN
500.0000 [IU] | Freq: Once | INTRAVENOUS | Status: AC | PRN
  Administered 2014-09-04: 500 [IU]
  Filled 2014-09-04: qty 5

## 2014-09-04 MED ORDER — SODIUM CHLORIDE 0.9 % IJ SOLN
10.0000 mL | INTRAMUSCULAR | Status: DC | PRN
  Administered 2014-09-04: 10 mL
  Filled 2014-09-04: qty 10

## 2014-09-04 NOTE — Telephone Encounter (Signed)
No need to called Lequita Asal for chemotherapy F/U.  She is here today for pump D/C.  Patient is doing well.  Denies n/v.  Denies any new side effects or symptoms.  Bowel and bladder is functioning well.  Eating and drinking well and I instructed to drink 64 oz minimum daily or at least the day before, of and after treatment.  Denies questions at this time and encouraged to call if needed.  Reviewed how to call after hours in the case of an emergency.  Reviewed cold intolerance precautions.

## 2014-09-06 ENCOUNTER — Encounter: Payer: Self-pay | Admitting: Hematology

## 2014-09-06 NOTE — Progress Notes (Signed)
I reset prior auth to [employer] with correct spelling of patient's name.

## 2014-09-06 NOTE — Progress Notes (Signed)
Per [employer] rx options ondansetron hcl 8mg  was approved.

## 2014-09-08 ENCOUNTER — Other Ambulatory Visit: Payer: Self-pay | Admitting: Hematology

## 2014-09-08 ENCOUNTER — Other Ambulatory Visit: Payer: Self-pay | Admitting: Pharmacist

## 2014-09-08 DIAGNOSIS — C259 Malignant neoplasm of pancreas, unspecified: Secondary | ICD-10-CM

## 2014-09-08 DIAGNOSIS — C787 Secondary malignant neoplasm of liver and intrahepatic bile duct: Principal | ICD-10-CM

## 2014-09-08 NOTE — Patient Outreach (Signed)
[facility]) Care Management  [place]   09/08/2014  Amber Bishop 28-Apr-1951 [id number]  Subjective: Amber Bishop is a 64 y.o. female referred to pharmacy for medication assistance. I spoke to the patient today to follow up to see if she was having any issues obtaining her medications. She denies any issues obtaining or affording her medications. She reports that she is working with a Education officer, museum at the cancer center who is helping her to address her financial issues.   Objective:   Current Medications: Current Outpatient Prescriptions  Medication Sig Dispense Refill  . ALPRAZolam (XANAX) 0.5 MG tablet TAKE ONE TABLET BY MOUTH IN THE MORNING AND TAKE ONE-HALF TABLET AT LUNCH (Patient taking differently: TAKE 1 TABLET THREE TIMES DAILY IF NEEDED FOR ANXIETY) 60 tablet 0  . amLODipine (NORVASC) 5 MG tablet Take 5 mg by mouth every morning.     . blood glucose meter kit and supplies KIT Dispense based on patient and insurance preference. Use up to four times daily as directed. Dx code: E11.65 1 each 0  . Calcium Carbonate-Vitamin D (CALCIUM 600/VITAMIN D) 600-400 MG-UNIT per tablet Take 1 tablet by mouth 2 (two) times daily.     . cetirizine (ZYRTEC) 10 MG tablet Take 10 mg by mouth daily.    . cholestyramine (QUESTRAN) 4 G packet Take 1/2-1 packet dissolved in water/juice once daily....................Marland KitchenPRN (Patient taking differently: Take 2-4 g by mouth daily as needed (diarrhea). Take 1/2-1 packet dissolved in water/juice once daily.....................PRN) 90 each 3  . dextromethorphan (DELSYM) 30 MG/5ML liquid Take 15 mg by mouth 2 (two) times daily as needed for cough.    Marland Kitchen FLUoxetine (PROZAC) 20 MG capsule Take 20 mg by mouth every morning.     Marland Kitchen glucose blood (ACCU-CHEK AVIVA PLUS) test strip Test 4 times daily as instructed. 125 each 3  . Glucose Blood (BLOOD GLUCOSE TEST STRIPS) STRP Use to test blood sugar 4 times daily as instructed. Dx code: E11.65  125 each 5  . insulin aspart (NOVOLOG FLEXPEN) 100 UNIT/ML FlexPen Inject 14-18 units into the skin 3 times daily as instructed. (Patient taking differently: Inject 20-24 Units into the skin 3 (three) times daily with meals. 80-99 20units 100-199 21 units 200-299 22 units  300 or greater 24 units) 15 mL 2  . Insulin Degludec (TRESIBA FLEXTOUCH [place]) Inject 44-52 Units into the skin at bedtime. Pt dose is 52 units at bedtime but will take 44 units if sugar is too low.    . Insulin Pen Needle (VALUMARK PEN NEEDLES) 31G X 8 MM MISC Use to inject insulin 4 times daily as instructed. 150 each 3  . LANCETS ULTRA THIN 30G MISC Use to test blood sugar 4 times daily as instructed. Dx code: E11.65 200 each 2  . lidocaine-prilocaine (EMLA) cream Apply 1 application topically as needed. 30 g 3  . meloxicam (MOBIC) 15 MG tablet Take 15 mg by mouth every morning.     . Multiple Vitamins-Minerals (CENTRUM SILVER PO) Take 1 capsule by mouth every morning.     . Multiple Vitamins-Minerals (ICAPS MV PO) Take 1 capsule by mouth 2 (two) times daily.     . Omega-3 Fatty Acids (FISH OIL) 1000 MG CAPS Take 1 capsule by mouth every morning.     . ondansetron (ZOFRAN) 8 MG tablet Take 1 tablet (8 mg total) by mouth 2 (two) times daily. Start the day after chemo for 2 days. Then take as needed for nausea  or vomiting. 30 tablet 1  . PREVNAR 13 SUSP injection Inject 0.5 mLs into the muscle once.   0  . prochlorperazine (COMPAZINE) 10 MG tablet Take 1 tablet (10 mg total) by mouth every 6 (six) hours as needed for nausea or vomiting. 30 tablet 0  . valsartan-hydrochlorothiazide (DIOVAN-HCT) 320-12.5 MG per tablet Take 1 tablet by mouth every morning.      No current facility-administered medications for this visit.    Functional Status: In your present state of health, do you have any difficulty performing the following activities: 08/31/2014 08/17/2014  Hearing? Tempie Donning  Vision? N N  Difficulty concentrating or making decisions?  N N  Walking or climbing stairs? N N  Dressing or bathing? N N    Fall/Depression Screening: No flowsheet data found.  Assessment: 1. Medication assistance: patient denies any need to medication assistance at this time. Patient receiving financial assistance in terms of help applying for programs and grants through cancer center.   Plan: 1. Will defer financial assistance planning to cancer center social worker. Patient is aware that she can contact me if she has any medication issues in the future. Will close out of pharmacy program.  Nicoletta Ba, PharmD, [facility] Resident [phone]

## 2014-09-09 ENCOUNTER — Ambulatory Visit: Payer: PPO

## 2014-09-09 ENCOUNTER — Encounter: Payer: Self-pay | Admitting: Physician Assistant

## 2014-09-09 ENCOUNTER — Telehealth: Payer: Self-pay

## 2014-09-09 ENCOUNTER — Ambulatory Visit: Payer: PPO | Admitting: Hematology

## 2014-09-09 ENCOUNTER — Other Ambulatory Visit (HOSPITAL_BASED_OUTPATIENT_CLINIC_OR_DEPARTMENT_OTHER): Payer: PPO

## 2014-09-09 ENCOUNTER — Ambulatory Visit (HOSPITAL_BASED_OUTPATIENT_CLINIC_OR_DEPARTMENT_OTHER): Payer: PPO | Admitting: Physician Assistant

## 2014-09-09 VITALS — BP 133/88 | HR 87 | Temp 98.1°F | Resp 18 | Ht 64.5 in | Wt 218.7 lb

## 2014-09-09 DIAGNOSIS — C251 Malignant neoplasm of body of pancreas: Secondary | ICD-10-CM

## 2014-09-09 DIAGNOSIS — C787 Secondary malignant neoplasm of liver and intrahepatic bile duct: Secondary | ICD-10-CM

## 2014-09-09 DIAGNOSIS — D696 Thrombocytopenia, unspecified: Secondary | ICD-10-CM

## 2014-09-09 DIAGNOSIS — C259 Malignant neoplasm of pancreas, unspecified: Secondary | ICD-10-CM

## 2014-09-09 LAB — COMPREHENSIVE METABOLIC PANEL (CC13)
ALT: 137 U/L — AB (ref 0–55)
ANION GAP: 10 meq/L (ref 3–11)
AST: 104 U/L — AB (ref 5–34)
Albumin: 3.4 g/dL — ABNORMAL LOW (ref 3.5–5.0)
Alkaline Phosphatase: 236 U/L — ABNORMAL HIGH (ref 40–150)
BILIRUBIN TOTAL: 0.62 mg/dL (ref 0.20–1.20)
BUN: 11.4 mg/dL (ref 7.0–26.0)
CO2: 24 meq/L (ref 22–29)
CREATININE: 0.7 mg/dL (ref 0.6–1.1)
Calcium: 8.4 mg/dL (ref 8.4–10.4)
Chloride: 104 mEq/L (ref 98–109)
EGFR: 85 mL/min/{1.73_m2} — ABNORMAL LOW (ref >90)
Glucose: 196 mg/dl — ABNORMAL HIGH (ref 70–140)
Potassium: 3.5 mEq/L (ref 3.5–5.1)
Sodium: 138 mEq/L (ref 136–145)
Total Protein: 6 g/dL — ABNORMAL LOW (ref 6.4–8.3)

## 2014-09-09 LAB — CBC WITH DIFFERENTIAL/PLATELET
BASO%: 0.4 % (ref 0.0–2.0)
BASOS ABS: 0 10*3/uL (ref 0.0–0.1)
EOS%: 5.4 % (ref 0.0–7.0)
Eosinophils Absolute: 0.4 10*3/uL (ref 0.0–0.5)
HCT: 36.9 % (ref 34.8–46.6)
HEMOGLOBIN: 12.3 g/dL (ref 11.6–15.9)
LYMPH%: 20.1 % (ref 14.0–49.7)
MCH: 28.7 pg (ref 25.1–34.0)
MCHC: 33.3 g/dL (ref 31.5–36.0)
MCV: 86 fL (ref 79.5–101.0)
MONO#: 0.7 10*3/uL (ref 0.1–0.9)
MONO%: 9.8 % (ref 0.0–14.0)
NEUT%: 64.3 % (ref 38.4–76.8)
NEUTROS ABS: 4.9 10*3/uL (ref 1.5–6.5)
Platelets: 78 10*3/uL — ABNORMAL LOW (ref 145–400)
RBC: 4.29 10*6/uL (ref 3.70–5.45)
RDW: 13.8 % (ref 11.2–14.5)
WBC: 7.6 10*3/uL (ref 3.9–10.3)
lymph#: 1.5 10*3/uL (ref 0.9–3.3)

## 2014-09-09 NOTE — Progress Notes (Signed)
[place]  [phone] [phone]  Clinic New Consult Note   Patient Care Team: Harlan Stains, MD as PCP - General (Family Medicine) Truitt Merle, MD as Consulting Physician (Hematology and Oncology) Delrae Rend, MD as Consulting Physician (Endocrinology) 09/09/2014  CHIEF COMPLAINTS/PURPOSE OF CONSULTATION:  Thrombocytopenia and a newly diagnosed metastatic pancreas cancer  Oncology History   Pancreatic cancer metastasized to liver   Staging form: Pancreas, AJCC 7th Edition     Clinical: Stage IV (T2, NX, M1) - Unsigned       Pancreatic cancer metastasized to liver   08/10/2014 Imaging CT abdomen showed 5.4 x 3.2 cm pancreatic body mass consistent with pancreatic adenocarcinoma. There is adjacent lymphadenopathy and diffuse  hepatic metastatic disease.   08/17/2014 Pathology Results Liver biopsy showed adenocarcinoma, consistent with pancreatic primary.   08/17/2014 Initial Diagnosis Pancreatic cancer metastasized to liver   08/24/2014 Tumor Marker CA19.9 >140000   08/31/2014 Imaging CT chest was negative for    HISTORY OF PRESENTING ILLNESS:  Amber Bishop 64 y.o. female was referred by her primary care physician Dr. Dema Severin for thrombocytopenia. She also was recently diagnosed with metastatic pancreatic cancer.   She has had a chronic thrombocytopenia since 2013. Her plt count has been in the range of 100-150, but dropped to the range of 80s lately. She denies any signs of bleeding. No history of liver disease.  She has been diabetic for 24 years, and her blood glucose has been out of control in the past one year. She also reports fatigue in the past one year. She denies any significant abdominal pain, jaundice, bloating, or change of her bowel habits. Her appetite has been normal. No nausea or vomiting. She is able to do all selfcare, but she sleeps more than usually during the day. No abdominal pain or bloating. She was found to have abnormal LFTs  on routine lab work, and her PCP Dr. Dema Severin, who ordered and abdominal US which lead to a CT scan, which showed a 5.4 x 3.4 cm pancreatic body mass, adjacent lymphadenopathy, and diffuse hepatic metastasis. She underwent ultrasound-guided liver biopsy on 08/17/2014, and the biopsy showed adenocarcinoma consistent with pancreatic primary.   INTERIM HISTORY: Amber Bishop returns for follow-up. She tolerated her first cycle of chemotherapy relatively well. She does report some increased fatigue and decreased appetite. These symptoms are slowly improving. She reports that she is taking Ester C daily. She is considering Frankincense essential oils as a topical treatment to the soles of her feet but will gather more information before her next visit. S She feels well overall and denies any new symptoms. No abdominal pain, nausea, bloating. Her appetite is moderate, eats fairly well.  MEDICAL HISTORY:  Past Medical History  Diagnosis Date  . Anxiety   . Hypertension   . IBS (irritable bowel syndrome)   . Hyperplastic colon polyp   . Allergy   . Anemia   . Arthritis   . Diverticulosis   . Dilated aortic root     seen on prior echo but echo 05/2013 showed normal dimensions  . Bicuspid aortic valve   . LVE (left ventricular enlargement)     mild by echo 1/15 with EF 50-55%  . pancreatic ca w/ liver mets dx'd 07/2014    SURGICAL HISTORY: Past Surgical History  Procedure Laterality Date  . Tubal ligation      age 12  . Colonoscopy    . Polypectomy    . Tooth extraction  3 teeth  . Belpharoptosis repair      SOCIAL HISTORY: History   Social History  . Marital Status: Single    Spouse Name: N/A  . Number of Children: 1 son   . Years of Education: N/A   Occupational History  . 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 Smoking status: Never Smoker   . Smokeless tobacco: Never Used  . Alcohol Use: No  . Drug Use: No  . Sexual Activity: Not on file   Other Topics Concern    . Not on file   Social History Narrative    FAMILY HISTORY: Family History  Problem Relation Age of Onset  . Colon cancer Neg Hx   . Esophageal cancer Neg Hx   . Stomach cancer Neg Hx   . Asthma Mother     ALLERGIES:  is allergic to naprosyn and metformin and related.  MEDICATIONS:  Current Outpatient Prescriptions  Medication Sig Dispense Refill  . ALPRAZolam (XANAX) 0.5 MG tablet TAKE ONE TABLET BY MOUTH IN THE MORNING AND TAKE ONE-HALF TABLET AT LUNCH (Patient taking differently: TAKE 1 TABLET THREE TIMES DAILY IF NEEDED FOR ANXIETY) 60 tablet 0  . amLODipine (NORVASC) 5 MG tablet Take 5 mg by mouth every morning.     . blood glucose meter kit and supplies KIT Dispense based on patient and insurance preference. Use up to four times daily as directed. Dx code: E11.65 1 each 0  . Calcium Carbonate-Vitamin D (CALCIUM 600/VITAMIN D) 600-400 MG-UNIT per tablet Take 1 tablet by mouth 2 (two) times daily.     . cetirizine (ZYRTEC) 10 MG tablet Take 10 mg by mouth daily.    . cholestyramine (QUESTRAN) 4 G packet Take 1/2-1 packet dissolved in water/juice once daily....................Marland KitchenPRN (Patient taking differently: Take 2-4 g by mouth daily as needed (diarrhea). Take 1/2-1 packet dissolved in water/juice once daily.....................PRN) 90 each 3  . dextromethorphan (DELSYM) 30 MG/5ML liquid Take 15 mg by mouth 2 (two) times daily as needed for cough.    Marland Kitchen FLUoxetine (PROZAC) 20 MG capsule Take 20 mg by mouth every morning.     Marland Kitchen glucose blood (ACCU-CHEK AVIVA PLUS) test strip Test 4 times daily as instructed. 125 each 3  . Glucose Blood (BLOOD GLUCOSE TEST STRIPS) STRP Use to test blood sugar 4 times daily as instructed. Dx code: E11.65 125 each 5  . insulin aspart (NOVOLOG FLEXPEN) 100 UNIT/ML FlexPen Inject 14-18 units into the skin 3 times daily as instructed. (Patient taking differently: Inject 20-24 Units into the skin 3 (three) times daily with meals. 80-99 20units 100-199 21  units 200-299 22 units  300 or greater 24 units) 15 mL 2  . Insulin Degludec (TRESIBA FLEXTOUCH [place]) Inject 44-52 Units into the skin at bedtime. Pt dose is 52 units at bedtime but will take 44 units if sugar is too low.    . Insulin Pen Needle (VALUMARK PEN NEEDLES) 31G X 8 MM MISC Use to inject insulin 4 times daily as instructed. 150 each 3  . LANCETS ULTRA THIN 30G MISC Use to test blood sugar 4 times daily as instructed. Dx code: E11.65 200 each 2  . lidocaine-prilocaine (EMLA) cream Apply 1 application topically as needed. 30 g 3  . meloxicam (MOBIC) 15 MG tablet Take 15 mg by mouth every morning.     . Multiple Vitamins-Minerals (CENTRUM SILVER PO) Take 1 capsule by mouth every morning.     . Multiple  Vitamins-Minerals (ICAPS MV PO) Take 1 capsule by mouth 2 (two) times daily.     . Omega-3 Fatty Acids (FISH OIL) 1000 MG CAPS Take 1 capsule by mouth every morning.     . ondansetron (ZOFRAN) 8 MG tablet Take 1 tablet (8 mg total) by mouth 2 (two) times daily. Start the day after chemo for 2 days. Then take as needed for nausea or vomiting. 30 tablet 1  . PREVNAR 13 SUSP injection Inject 0.5 mLs into the muscle once.   0  . prochlorperazine (COMPAZINE) 10 MG tablet Take 1 tablet (10 mg total) by mouth every 6 (six) hours as needed for nausea or vomiting. 30 tablet 0  . valsartan-hydrochlorothiazide (DIOVAN-HCT) 320-12.5 MG per tablet Take 1 tablet by mouth every morning.      No current facility-administered medications for this visit.    REVIEW OF SYSTEMS:   Constitutional: Denies fevers, chills or abnormal night sweats Eyes: Denies blurriness of vision, double vision or watery eyes Ears, nose, mouth, throat, and face: Denies mucositis or sore throat Respiratory: Denies cough, dyspnea or wheezes Cardiovascular: Denies palpitation, chest discomfort or lower extremity swelling Gastrointestinal:  Denies nausea, heartburn or change in bowel habits Skin: Denies abnormal skin  rashes Lymphatics: Denies new lymphadenopathy or easy bruising Neurological:Denies numbness, tingling or new weaknesses Behavioral/Psych: Mood is stable, no new changes  All other systems were reviewed with the patient and are negative.   PHYSICAL EXAMINATION: ECOG PERFORMANCE STATUS: 1 - Symptomatic but completely ambulatory  Filed Vitals:   09/09/14 1334  BP: 133/88  Pulse: 87  Temp: 98.1 F (36.7 C)  Resp: 18   Filed Weights   09/09/14 1334  Weight: 218 lb 11.2 oz (99.202 kg)    GENERAL:alert, no distress and comfortable SKIN: skin color, texture, turgor are normal, no rashes or significant lesions EYES: normal, conjunctiva are pink and non-injected, sclera clear OROPHARYNX:no exudate, no erythema and lips, buccal mucosa, and tongue normal  NECK: supple, thyroid normal size, non-tender, without nodularity LYMPH:  no palpable lymphadenopathy in the cervical, axillary or inguinal LUNGS: clear to auscultation and percussion with normal breathing effort HEART: regular rate & rhythm and no murmurs and no lower extremity edema ABDOMEN:abdomen soft, non-tender and normal bowel sounds Musculoskeletal:no cyanosis of digits and no clubbing  PSYCH: alert & oriented x 3 with fluent speech NEURO: no focal motor/sensory deficits  LABORATORY DATA:  I have reviewed the data as listed Lab Results  Component Value Date   WBC 7.6 09/09/2014   HGB 12.3 09/09/2014   HCT 36.9 09/09/2014   MCV 86.0 09/09/2014   PLT 78* 09/09/2014    Recent Labs  05/05/14 0818 08/24/14 1328 09/09/14 1312  NA 138 140 138  K 3.6 3.9 3.5  CL 105  --   --   CO2 28 29 24   GLUCOSE 245* 366* 196*  BUN 13 18.8 11.4  CREATININE 0.8 1.0 0.7  CALCIUM 8.8 8.9 8.4  PROT 6.4 6.6 6.0*  ALBUMIN 3.8 3.7 3.4*  AST 18 74* 104*  ALT 25 96* 137*  ALKPHOS 68 212* 236*  BILITOT 0.5 0.98 0.62    RADIOGRAPHIC STUDIES: I have personally reviewed the radiological images as listed and agreed with the findings in  the report.  Ct Abdomen Pelvis W Wo Contrast 08/10/2014    IMPRESSION: 1. 5.4 x 3.2 cm pancreatic body mass consistent with pancreatic adenocarcinoma. There is adjacent lymphadenopathy and diffuse hepatic metastatic disease. The portal vein is patent. 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 The SMV is occluded at the splenic confluence. 2. Very geographic fatty infiltration of the liver. 3. Left-sided biliary dilatation. There is likely meta tumor compressing the left hepatic duct. 4. Uterine fibroids.   Electronically Signed   By: Marijo Sanes M.D.   On: 08/10/2014 14:14   CT chet 08/31/2014 IMPRESSION: No findings specific for metastatic disease in the chest.  3 mm right lower lobe pulmonary nodule favors a benign subpleural lymph node. Attention on follow-up is suggested.  3.1 x 4.6 cm mass in the pancreatic body, corresponding to known pancreatic cancer.  Multifocal hepatic metastases, incompletely visualized.     ASSESSMENT & PLAN:  64 year old Caucasian female, with past medical history of diabetes, hypertension, IBS, arthritis, who was found to have worsening thrombocytopenia, and a newly diagnosed metastatic rectal cancer.  1. Pancreatic cancer with liver metastasis - She is currently being treated with FOLFOX with no bolus and at a decreased dose due to her history of significant cytopenias.-Giving her moderate some cytopenia, -Follow-up her CBC closely.  2. Thrombocytopenia -This appears to be chronic, she has no history of liver disease, CT scan did not reveal liver cirrhosis or spleen medically. 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 If her platelet count drops significantly after chemotherapy, Dr. Burr Medico planned to try a course of prednisone. Her platelet count dropped by 20,000 after chemotherapy with no bleeding or bruising. We will continue to monitor her platelet count closely. -As a bone marrow disease is also possible. Giving the overall poor prognosis from a metastatic pancreas cancer, Dr.  Burr Medico did not recommend a bone marrow biopsy at this point.  3. Diabetes, hypertension, IBS, anxiety -She will continue follow-up with her primary care physician -Her blood glucose has been poorly controlled lately, She is to continue to monitor her sugar closely at home and contact her primary care physician for her insulin dose adjustment  Plan -Status post 1 cycle of reduced dose FOLFOX with no 5-FU bolus -Return to clinic next week for follow-up and lab and cycle #2.  All questions were answered. The patient knows to call the clinic with any problems, questions or concerns. I spent 20 minutes counseling the patient face to face. The total time spent in the appointment was 30 minutes and more than 50% was on counseling.     Carlton Adam, PA-C 09/09/2014 4:14 PM

## 2014-09-09 NOTE — Telephone Encounter (Signed)
Amber Bishop crandall cousin to [employer] is calling to ask travel advice. She had been talking to susan coward yesterday and instructed to call about 4 pm today. Will forward message and phone [phone]

## 2014-09-10 ENCOUNTER — Telehealth: Payer: Self-pay | Admitting: Hematology

## 2014-09-10 ENCOUNTER — Ambulatory Visit: Payer: PPO | Admitting: Internal Medicine

## 2014-09-10 NOTE — Telephone Encounter (Signed)
I spoke with pt's cousin Bethena Roys regarding her travel arrangement. I warned her the possibility of neutropenia and some cytopenia and related infection and bleeding after chemotherapy, and advised her to have the trip after her second cycle chemotherapy. I also told her okay to postpone her chemotherapy if she wants spent more time with her family. She voiced good understanding and will talk to the patient.  Truitt Merle  09/10/2014

## 2014-09-11 NOTE — Patient Instructions (Signed)
Follow up in one week as previously scheduled, prior to your next scheduled cycle of chemotherapy

## 2014-09-13 ENCOUNTER — Telehealth: Payer: Self-pay | Admitting: *Deleted

## 2014-09-13 NOTE — Telephone Encounter (Signed)
Called to request an appointment with the social worker siting she has all the paperwork she needed from her. Informed her I will pass message on to Grier/Abigail to reach her.

## 2014-09-14 ENCOUNTER — Encounter: Payer: Self-pay | Admitting: Internal Medicine

## 2014-09-14 ENCOUNTER — Ambulatory Visit (INDEPENDENT_AMBULATORY_CARE_PROVIDER_SITE_OTHER): Payer: PPO | Admitting: Internal Medicine

## 2014-09-14 VITALS — BP 136/80 | HR 88 | Ht 64.5 in | Wt 218.0 lb

## 2014-09-14 DIAGNOSIS — C787 Secondary malignant neoplasm of liver and intrahepatic bile duct: Secondary | ICD-10-CM | POA: Diagnosis not present

## 2014-09-14 DIAGNOSIS — C259 Malignant neoplasm of pancreas, unspecified: Secondary | ICD-10-CM | POA: Diagnosis not present

## 2014-09-14 DIAGNOSIS — K589 Irritable bowel syndrome without diarrhea: Secondary | ICD-10-CM | POA: Diagnosis not present

## 2014-09-14 MED ORDER — ALPRAZOLAM 0.5 MG PO TABS: ORAL_TABLET | ORAL | Status: DC

## 2014-09-14 NOTE — Progress Notes (Signed)
Amber Bishop 09-Aug-1950 [id number]  Note: This dictation was prepared with Dragon digital system. Any transcriptional errors that result from this procedure are unintentional.   History of Present Illness: This is a 64 year old white female with history of irritable bowel syndrome with recent diagnosis of  metastatic adenocarcinoma with primary in the pancreas, confirmed by liver biopsy on 3/29/ 2016th. Negative CT scan of the chest. She has started chemotherapy with Dr. Burr Medico. We saw her for screening colonoscopy in October 2012 which showed mild diverticulosis. She needs  refill on Xanax. She has had weight loss of about 30 pounds still being overweight. She denies nausea or vomiting or diarrhea., jaundice She denies abdominal pain    Past Medical History  Diagnosis Date  . Anxiety   . Hypertension   . IBS (irritable bowel syndrome)   . Hyperplastic colon polyp   . Allergy   . Anemia   . Arthritis   . Diverticulosis   . Dilated aortic root     seen on prior echo but echo 05/2013 showed normal dimensions  . Bicuspid aortic valve   . LVE (left ventricular enlargement)     mild by echo 1/15 with EF 50-55%  . pancreatic ca w/ liver mets dx'd 07/2014    Past Surgical History  Procedure Laterality Date  . Tubal ligation      age 18  . Colonoscopy    . Polypectomy    . Tooth extraction      3 teeth  . Belpharoptosis repair      Allergies  Allergen Reactions  . Naprosyn [Naproxen] Nausea And Vomiting    Very nauseated but no vomiting.  . Metformin And Related Diarrhea    Pt has IBS     Family history and social history have been reviewed.  Review of Systems: Denies abdominal pain. Positive for weight loss  The remainder of the 10 point ROS is negative except as outlined in the H&P  Physical Exam: General Appearance Well developed, in no distress Eyes  Non icteric  HEENT  Non traumatic, normocephalic  Mouth No lesion, tongue papillated, no cheilosis Neck Supple  without adenopathy, thyroid not enlarged, no carotid bruits, no JVD Lungs Clear to auscultation bilaterally, Port-A-Cath placed in right upper chest below right clavicle COR Normal S1, normal S2, regular rhythm, no murmur, quiet precordium Abdomen soft nontender abdomen with normoactive bowel sounds. Rectal not done Extremities  No pedal edema Skin No lesions Neurological Alert and oriented x 3 Psychological Normal mood and affect  Assessment and Plan:  64 year old white female with new diagnosis of pancreatic CA metastatic to the liver diagnosed by Dr Harlan Stains , followed by Dr.Feng,  History of irritable bowel syndrome with anxiety. We will refill her Xanax 0.5 mg 2-3 times a day.     [name] 09/14/2014

## 2014-09-14 NOTE — Patient Instructions (Addendum)
We have given you a printed prescription of Xanax  Follow up as needed  Dr Burr Medico, Dr C.White

## 2014-09-15 ENCOUNTER — Encounter: Payer: Self-pay | Admitting: *Deleted

## 2014-09-15 NOTE — Progress Notes (Signed)
[place] Work  Clinical Social Work was referred by patient navigator for assessment of psychosocial needs.  Clinical Social Worker contacted patient at home to offer support and assess for needs.  CSW to meet with pt during infusion to complete Cancer Care application and further assess needs. Pt aware to bring needed paperwork and that CSW to see in infusion room.   Loren Racer, [place] Worker [place]  [place] Phone: [phone] Fax: [phone]

## 2014-09-16 ENCOUNTER — Other Ambulatory Visit (HOSPITAL_BASED_OUTPATIENT_CLINIC_OR_DEPARTMENT_OTHER): Payer: PPO

## 2014-09-16 ENCOUNTER — Ambulatory Visit (HOSPITAL_BASED_OUTPATIENT_CLINIC_OR_DEPARTMENT_OTHER): Payer: PPO

## 2014-09-16 ENCOUNTER — Ambulatory Visit (HOSPITAL_BASED_OUTPATIENT_CLINIC_OR_DEPARTMENT_OTHER): Payer: PPO | Admitting: Hematology

## 2014-09-16 ENCOUNTER — Telehealth: Payer: Self-pay | Admitting: Hematology

## 2014-09-16 ENCOUNTER — Other Ambulatory Visit: Payer: PPO

## 2014-09-16 ENCOUNTER — Ambulatory Visit: Payer: PPO | Admitting: Hematology

## 2014-09-16 VITALS — BP 145/62 | HR 90 | Temp 98.3°F | Resp 20 | Ht 64.5 in | Wt 218.3 lb

## 2014-09-16 DIAGNOSIS — Z5111 Encounter for antineoplastic chemotherapy: Secondary | ICD-10-CM

## 2014-09-16 DIAGNOSIS — C259 Malignant neoplasm of pancreas, unspecified: Secondary | ICD-10-CM

## 2014-09-16 DIAGNOSIS — D696 Thrombocytopenia, unspecified: Secondary | ICD-10-CM | POA: Diagnosis not present

## 2014-09-16 DIAGNOSIS — C251 Malignant neoplasm of body of pancreas: Secondary | ICD-10-CM

## 2014-09-16 DIAGNOSIS — C787 Secondary malignant neoplasm of liver and intrahepatic bile duct: Secondary | ICD-10-CM

## 2014-09-16 LAB — COMPREHENSIVE METABOLIC PANEL (CC13)
ALT: 111 U/L — ABNORMAL HIGH (ref 0–55)
ANION GAP: 10 meq/L (ref 3–11)
AST: 79 U/L — ABNORMAL HIGH (ref 5–34)
Albumin: 3.5 g/dL (ref 3.5–5.0)
Alkaline Phosphatase: 255 U/L — ABNORMAL HIGH (ref 40–150)
BUN: 12.8 mg/dL (ref 7.0–26.0)
CALCIUM: 9.2 mg/dL (ref 8.4–10.4)
CO2: 27 meq/L (ref 22–29)
Chloride: 101 mEq/L (ref 98–109)
Creatinine: 1 mg/dL (ref 0.6–1.1)
EGFR: 62 mL/min/{1.73_m2} — ABNORMAL LOW (ref >90)
GLUCOSE: 424 mg/dL — AB (ref 70–140)
POTASSIUM: 4.2 meq/L (ref 3.5–5.1)
SODIUM: 137 meq/L (ref 136–145)
Total Bilirubin: 0.68 mg/dL (ref 0.20–1.20)
Total Protein: 6.1 g/dL — ABNORMAL LOW (ref 6.4–8.3)

## 2014-09-16 LAB — CBC WITH DIFFERENTIAL/PLATELET
BASO%: 1.4 % (ref 0.0–2.0)
BASOS ABS: 0.1 10*3/uL (ref 0.0–0.1)
EOS%: 4.4 % (ref 0.0–7.0)
Eosinophils Absolute: 0.2 10*3/uL (ref 0.0–0.5)
HEMATOCRIT: 38.7 % (ref 34.8–46.6)
HEMOGLOBIN: 12.3 g/dL (ref 11.6–15.9)
LYMPH%: 24.2 % (ref 14.0–49.7)
MCH: 27.4 pg (ref 25.1–34.0)
MCHC: 31.8 g/dL (ref 31.5–36.0)
MCV: 86.1 fL (ref 79.5–101.0)
MONO#: 0.7 10*3/uL (ref 0.1–0.9)
MONO%: 12.2 % (ref 0.0–14.0)
NEUT%: 57.8 % (ref 38.4–76.8)
NEUTROS ABS: 3.3 10*3/uL (ref 1.5–6.5)
PLATELETS: 119 10*3/uL — AB (ref 145–400)
RBC: 4.49 10*6/uL (ref 3.70–5.45)
RDW: 14.2 % (ref 11.2–14.5)
WBC: 5.6 10*3/uL (ref 3.9–10.3)
lymph#: 1.4 10*3/uL (ref 0.9–3.3)

## 2014-09-16 MED ORDER — SODIUM CHLORIDE 0.9 % IV SOLN
2400.0000 mg/m2 | INTRAVENOUS | Status: DC
  Administered 2014-09-16: 5100 mg via INTRAVENOUS
  Filled 2014-09-16: qty 102

## 2014-09-16 MED ORDER — DEXTROSE 5 % IV SOLN
Freq: Once | INTRAVENOUS | Status: AC
  Administered 2014-09-16: 14:00:00 via INTRAVENOUS

## 2014-09-16 MED ORDER — LEUCOVORIN CALCIUM INJECTION 350 MG
400.0000 mg/m2 | Freq: Once | INTRAMUSCULAR | Status: AC
  Administered 2014-09-16: 848 mg via INTRAVENOUS
  Filled 2014-09-16: qty 42.4

## 2014-09-16 MED ORDER — DEXTROSE 5 % IV SOLN
70.0000 mg/m2 | Freq: Once | INTRAVENOUS | Status: AC
  Administered 2014-09-16: 150 mg via INTRAVENOUS
  Filled 2014-09-16: qty 30

## 2014-09-16 MED ORDER — SODIUM CHLORIDE 0.9 % IV SOLN
Freq: Once | INTRAVENOUS | Status: AC
  Administered 2014-09-16: 14:00:00 via INTRAVENOUS
  Filled 2014-09-16: qty 4

## 2014-09-16 MED ORDER — LIDOCAINE-PRILOCAINE 2.5-2.5 % EX CREA
TOPICAL_CREAM | CUTANEOUS | Status: AC
  Filled 2014-09-16: qty 5

## 2014-09-16 MED ORDER — SODIUM CHLORIDE 0.9 % IV SOLN
2400.0000 mg/m2 | INTRAVENOUS | Status: DC
  Filled 2014-09-16: qty 102

## 2014-09-16 NOTE — Telephone Encounter (Signed)
Gave avs & calendar for May. Sent message to schedule treatment. °

## 2014-09-16 NOTE — Patient Instructions (Signed)
[facility] Discharge Instructions for Patients Receiving Chemotherapy  Today you received the following chemotherapy agents; Oxaliplatin, Leucovorin and Fluorouracil.   To help prevent nausea and vomiting after your treatment, we encourage you to take your nausea medication as directed.    If you develop nausea and vomiting that is not controlled by your nausea medication, call the clinic.   BELOW ARE SYMPTOMS THAT SHOULD BE REPORTED IMMEDIATELY:  *FEVER GREATER THAN 100.5 F  *CHILLS WITH OR WITHOUT FEVER  NAUSEA AND VOMITING THAT IS NOT CONTROLLED WITH YOUR NAUSEA MEDICATION  *UNUSUAL SHORTNESS OF BREATH  *UNUSUAL BRUISING OR BLEEDING  TENDERNESS IN MOUTH AND THROAT WITH OR WITHOUT PRESENCE OF ULCERS  *URINARY PROBLEMS  *BOWEL PROBLEMS  UNUSUAL RASH Items with * indicate a potential emergency and should be followed up as soon as possible.  Feel free to call the clinic you have any questions or concerns. The clinic phone number is [phone].  Please show the [place] at check-in to the Emergency Department and triage nurse.

## 2014-09-16 NOTE — Progress Notes (Signed)
Dr. Burr Medico aware of CMET results and orders ok to treat today.

## 2014-09-16 NOTE — Progress Notes (Signed)
[place]  [phone] [phone]  Clinic New Consult Note   Patient Care Team: Harlan Stains, MD as PCP - General (Family Medicine) Truitt Merle, MD as Consulting Physician (Hematology and Oncology) Delrae Rend, MD as Consulting Physician (Endocrinology) 09/16/2014  CHIEF COMPLAINTS/PURPOSE OF CONSULTATION:  Thrombocytopenia and a newly diagnosed metastatic pancreas cancer  Oncology History   Pancreatic cancer metastasized to liver   Staging form: Pancreas, AJCC 7th Edition     Clinical: Stage IV (T2, NX, M1) - Unsigned       Pancreatic cancer metastasized to liver   08/10/2014 Imaging CT abdomen showed 5.4 x 3.2 cm pancreatic body mass consistent with pancreatic adenocarcinoma. There is adjacent lymphadenopathy and diffuse  hepatic metastatic disease.   08/17/2014 Pathology Results Liver biopsy showed adenocarcinoma, consistent with pancreatic primary.   08/17/2014 Initial Diagnosis Pancreatic cancer metastasized to liver   08/24/2014 Tumor Marker CA19.9 >140000   08/31/2014 Imaging CT chest was negative for    HISTORY OF PRESENTING ILLNESS:  Amber Bishop 64 y.o. female was referred by her primary care physician Dr. Dema Severin for thrombocytopenia. She also was recently diagnosed with metastatic pancreatic cancer.   She has had a chronic thrombocytopenia since 2013. Her plt count has been in the range of 100-150, but dropped to the range of 80s lately. She denies any signs of bleeding. No history of liver disease.  She has been diabetic for 24 years, and her blood glucose has been out of control in the past one year. She also reports fatigue in the past one year. She denies any significant abdominal pain, jaundice, bloating, or change of her bowel habits. Her appetite has been normal. No nausea or vomiting. She is able to do all selfcare, but she sleeps more than usually during the day. No abdominal pain or bloating. She was found to have abnormal LFTs  on routine lab work, and her PCP Dr. Dema Severin, who ordered and abdominal US which lead to a CT scan, which showed a 5.4 x 3.4 cm pancreatic body mass, adjacent lymphadenopathy, and diffuse hepatic metastasis. She underwent ultrasound-guided liver biopsy on 08/17/2014, and the biopsy showed adenocarcinoma consistent with pancreatic primary.   INTERIM HISTORY: Story returns for follow-up. She tolerated her first cycle of chemotherapy very well. She denies any nausea, diarrhea, bleeding or other symptoms. Her appetite is good, no pain, or other complains.   MEDICAL HISTORY:  Past Medical History  Diagnosis Date  . Anxiety   . Hypertension   . IBS (irritable bowel syndrome)   . Hyperplastic colon polyp   . Allergy   . Anemia   . Arthritis   . Diverticulosis   . Dilated aortic root     seen on prior echo but echo 05/2013 showed normal dimensions  . Bicuspid aortic valve   . LVE (left ventricular enlargement)     mild by echo 1/15 with EF 50-55%  . pancreatic ca w/ liver mets dx'd 07/2014    SURGICAL HISTORY: Past Surgical History  Procedure Laterality Date  . Tubal ligation      age 50  . Colonoscopy    . Polypectomy    . Tooth extraction      3 teeth  . Belpharoptosis repair      SOCIAL HISTORY: History   Social History  . Marital Status: Single    Spouse Name: N/A  . Number of Children: 1 son   . Years of Education: N/A   Occupational History  .  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 Smoking status: Never Smoker   . Smokeless tobacco: Never Used  . Alcohol Use: No  . Drug Use: No  . Sexual Activity: Not on file   Other Topics Concern  . Not on file   Social History Narrative    FAMILY HISTORY: Family History  Problem Relation Age of Onset  . Colon cancer Neg Hx   . Esophageal cancer Neg Hx   . Stomach cancer Neg Hx   . Asthma Mother     ALLERGIES:  is allergic to naprosyn and metformin and related.  MEDICATIONS:  Current Outpatient  Prescriptions  Medication Sig Dispense Refill  . ALPRAZolam (XANAX) 0.5 MG tablet TAKE 1 TABLET THREE TIMES DAILY IF NEEDED FOR ANXIETY 90 tablet 3  . amLODipine (NORVASC) 5 MG tablet Take 5 mg by mouth every morning.     . blood glucose meter kit and supplies KIT Dispense based on patient and insurance preference. Use up to four times daily as directed. Dx code: E11.65 1 each 0  . Calcium Carbonate-Vitamin D (CALCIUM 600/VITAMIN D) 600-400 MG-UNIT per tablet Take 1 tablet by mouth 2 (two) times daily.     . cetirizine (ZYRTEC) 10 MG tablet Take 10 mg by mouth daily.    . cholestyramine (QUESTRAN) 4 G packet Take 1/2-1 packet dissolved in water/juice once daily....................Marland KitchenPRN (Patient taking differently: Take 2-4 g by mouth daily as needed (diarrhea). Take 1/2-1 packet dissolved in water/juice once daily.....................PRN) 90 each 3  . dextromethorphan (DELSYM) 30 MG/5ML liquid Take 15 mg by mouth 2 (two) times daily as needed for cough.    Marland Kitchen FLUoxetine (PROZAC) 20 MG capsule Take 20 mg by mouth every morning.     Marland Kitchen glucose blood (ACCU-CHEK AVIVA PLUS) test strip Test 4 times daily as instructed. 125 each 3  . insulin aspart (NOVOLOG FLEXPEN) 100 UNIT/ML FlexPen Inject 14-18 units into the skin 3 times daily as instructed. (Patient taking differently: Inject 20-24 Units into the skin 3 (three) times daily with meals. 80-99 20units 100-199 21 units 200-299 22 units  300 or greater 24 units) 15 mL 2  . Insulin Degludec (TRESIBA FLEXTOUCH [place]) Inject 44-52 Units into the skin at bedtime. Pt dose is 52 units at bedtime but will take 44 units if sugar is too low.    . Insulin Pen Needle (VALUMARK PEN NEEDLES) 31G X 8 MM MISC Use to inject insulin 4 times daily as instructed. 150 each 3  . LANCETS ULTRA THIN 30G MISC Use to test blood sugar 4 times daily as instructed. Dx code: E11.65 200 each 2  . lidocaine-prilocaine (EMLA) cream Apply 1 application topically as needed. 30 g 3  . meloxicam  (MOBIC) 15 MG tablet Take 15 mg by mouth every morning.     . Multiple Vitamins-Minerals (CENTRUM SILVER PO) Take 1 capsule by mouth every morning.     . Multiple Vitamins-Minerals (ICAPS MV PO) Take 1 capsule by mouth 2 (two) times daily.     . Omega-3 Fatty Acids (FISH OIL) 1000 MG CAPS Take 1 capsule by mouth every morning.     . ondansetron (ZOFRAN) 8 MG tablet Take 1 tablet (8 mg total) by mouth 2 (two) times daily. Start the day after chemo for 2 days. Then take as needed for nausea or vomiting. 30 tablet 1  . PREVNAR 13 SUSP injection Inject 0.5 mLs into the muscle once.   0  .  prochlorperazine (COMPAZINE) 10 MG tablet Take 1 tablet (10 mg total) by mouth every 6 (six) hours as needed for nausea or vomiting. 30 tablet 0  . valsartan-hydrochlorothiazide (DIOVAN-HCT) 320-12.5 MG per tablet Take 1 tablet by mouth every morning.      No current facility-administered medications for this visit.    REVIEW OF SYSTEMS:   Constitutional: Denies fevers, chills or abnormal night sweats Eyes: Denies blurriness of vision, double vision or watery eyes Ears, nose, mouth, throat, and face: Denies mucositis or sore throat Respiratory: Denies cough, dyspnea or wheezes Cardiovascular: Denies palpitation, chest discomfort or lower extremity swelling Gastrointestinal:  Denies nausea, heartburn or change in bowel habits Skin: Denies abnormal skin rashes Lymphatics: Denies new lymphadenopathy or easy bruising Neurological:Denies numbness, tingling or new weaknesses Behavioral/Psych: Mood is stable, no new changes  All other systems were reviewed with the patient and are negative.   PHYSICAL EXAMINATION: ECOG PERFORMANCE STATUS: 1 - Symptomatic but completely ambulatory  Filed Vitals:   09/16/14 1219  BP: 145/62  Pulse: 90  Temp: 98.3 F (36.8 C)  Resp: 20   Filed Weights   09/16/14 1219  Weight: 218 lb 4.8 oz (99.02 kg)    GENERAL:alert, no distress and comfortable SKIN: skin color,  texture, turgor are normal, no rashes or significant lesions EYES: normal, conjunctiva are pink and non-injected, sclera clear OROPHARYNX:no exudate, no erythema and lips, buccal mucosa, and tongue normal  NECK: supple, thyroid normal size, non-tender, without nodularity LYMPH:  no palpable lymphadenopathy in the cervical, axillary or inguinal LUNGS: clear to auscultation and percussion with normal breathing effort HEART: regular rate & rhythm and no murmurs and no lower extremity edema ABDOMEN:abdomen soft, non-tender and normal bowel sounds Musculoskeletal:no cyanosis of digits and no clubbing  PSYCH: alert & oriented x 3 with fluent speech NEURO: no focal motor/sensory deficits  LABORATORY DATA:  I have reviewed the data as listed CBC Latest Ref Rng 09/16/2014 09/09/2014 08/31/2014  WBC 3.9 - 10.3 10e3/uL 5.6 7.6 6.6  Hemoglobin 11.6 - 15.9 g/dL 12.3 12.3 13.0  Hematocrit 34.8 - 46.6 % 38.7 36.9 40.3  Platelets 145 - 400 10e3/uL 119(L) 78(L) 98(L)    CMP Latest Ref Rng 09/16/2014 09/09/2014 08/24/2014  Glucose 70 - 140 mg/dl 424(H) 196(H) 366(H)  BUN 7.0 - 26.0 mg/dL 12.8 11.4 18.8  Creatinine 0.6 - 1.1 mg/dL 1.0 0.7 1.0  Sodium 136 - 145 mEq/L 137 138 140  Potassium 3.5 - 5.1 mEq/L 4.2 3.5 3.9  Chloride 96 - 112 mEq/L - - -  CO2 22 - 29 mEq/L _0 Calcium 8.4 - 10.4 mg/dL 9.2 8.4 8.9  Total Protein 6.4 - 8.3 g/dL 6.1(L) 6.0(L) 6.6  Total Bilirubin 0.20 - 1.20 mg/dL 0.68 0.62 0.98  Alkaline Phos 40 - 150 U/L 255(H) 236(H) 212(H)  AST 5 - 34 U/L 79(H) 104(H) 74(H)  ALT 0 - 55 U/L 111(H) 137(H) 96(H)    PATHOLOGY REPORT: Diagnosis 08/17/2014 Liver, needle/core biopsy, left - ADENOCARCINOMA. Microscopic Comment The morphologic features are compatible with metastatic pancreatic adenocarcinoma.  RADIOGRAPHIC STUDIES: I have personally reviewed the radiological images as listed and agreed with the findings in the report.  Ct Abdomen Pelvis W Wo Contrast 08/10/2014      IMPRESSION: 1. 5.4 x 3.2 cm pancreatic body mass consistent with pancreatic adenocarcinoma. There is adjacent lymphadenopathy and diffuse hepatic metastatic disease. The portal vein is patent. 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 2. Very geographic  fatty infiltration of the liver. 3. Left-sided biliary dilatation. There is likely meta tumor compressing the left hepatic duct. 4. Uterine fibroids.   Electronically Signed   By: Marijo Sanes M.D.   On: 08/10/2014 14:14      CT chet 08/31/2014 IMPRESSION: No findings specific for metastatic disease in the chest.  3 mm right lower lobe pulmonary nodule favors a benign subpleural lymph node. Attention on follow-up is suggested.  3.1 x 4.6 cm mass in the pancreatic body, corresponding to known pancreatic cancer.  Multifocal hepatic metastases, incompletely visualized.   ASSESSMENT & PLAN:  64 year old Caucasian female, with past medical history of diabetes, hypertension, IBS, arthritis, who was found to have worsening thrombocytopenia, and a newly diagnosed metastatic rectal cancer.  1. Pancreatic cancer with liver metastasis -I reviewed her CT chest, abdomen and pelvis scan findings in the liver mass biopsy results in great detail. The image was reviewed in person. -I discussed the incurable nature of metastatic pancreas cancer and overall poor prognosis. However I am little concerned about how much she really grasp about this. She does have very positive  Attitude  -I recommend systemic chemotherapy. The goal of treatment is palliation and to prolong her life. -I discussed the option of single agent gemcitabine, gemcitabine with Abraxane, FOLFOX or FOLFIRINOX -Due to her significant thrombocytopenia, I think gemcitabine-based regimen would be difficult to tolerate. I recommend a FOLFOX as first line therapy. Side effects of chemotherapy therapy were discussed with patient and she agrees to  proceed. -Giving her moderate some cytopenia, I'll dose reduce 15% for her first cycle and omit the 5 FU bolus. -She tolerated this cycle well, platelet dropped slightly.  -Lab reviewed, her liver functions slightly improved, we'll proceed with a second cycle, with 15% of occipital pattern dose reduction, and full doses of 5-FU   2. Thrombocytopenia -This appears to be chronic, she has no history of liver disease, CT scan did not reveal liver cirrhosis or spleen medically. 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 If her platelet count drops significantly after chemotherapy, I'll try a course of prednisone.  -As a bone marrow disease is also possible. Giving the overall poor prognosis from a metastatic pancreas cancer, I did not recommend a bone marrow biopsy at this point.  3. Diabetes, hypertension, IBS, anxiety -She will continue follow-up with her primary care physician -Her blood glucose has been poorly controlled lately, I recommend her monitoring her sugar closely at home and contact her primary care physician for her insulin dose adjustment  4. Social support -She lives alone, her ex-husband is supportive. She knows to call for help if needed  Plan -second cycle FOLFOX today with 15% dose reduction on oxaliplatin and no 5-FU bolus -Return to clinic in two weeks for follow-up and lab. -She is planning to travel next weekend   All questions were answered. The patient knows to call the clinic with any problems, questions or concerns. I spent 20 minutes counseling the patient face to face. The total time spent in the appointment was 30 minutes and more than 50% was on counseling.      Truitt Merle, MD 09/16/2014 12:27 PM

## 2014-09-16 NOTE — [facility] Oncology Navigator Note (Signed)
Met with patient and boyfriend during chemotherapy treatment to provide support and assess for needs to promote continuity of care. She reports she has done well with chemo except for fatigue. Excited about her trip to [place] during Mother's Day weekend, even though she is anxious about the plane trip. She denies any needs at this time from nurse.  Merceda Elks, RN, BSN GI Oncology [place]

## 2014-09-17 ENCOUNTER — Encounter: Payer: Self-pay | Admitting: *Deleted

## 2014-09-17 ENCOUNTER — Encounter: Payer: Self-pay | Admitting: Hematology

## 2014-09-17 NOTE — Progress Notes (Signed)
[place] Work  Clinical Social Work met with pt in the infusion room to assist with [place] for transportation and co-pay assistance. Pt needed to bring tax return, but states her son has copy. Pt gave CSW permission to contact son to further proceed with application. CSW left vm for son stating need. CSW to continue to process applications. Pt doing well and in good spirits today.    Clinical Social Work interventions: Resource asst.  Loren Racer, [place] Worker [place]  [place] Phone: [phone] Fax: [phone]

## 2014-09-18 ENCOUNTER — Ambulatory Visit (HOSPITAL_BASED_OUTPATIENT_CLINIC_OR_DEPARTMENT_OTHER): Payer: PPO

## 2014-09-18 VITALS — BP 147/85 | HR 87 | Temp 98.1°F | Resp 18

## 2014-09-18 DIAGNOSIS — Z452 Encounter for adjustment and management of vascular access device: Secondary | ICD-10-CM

## 2014-09-18 DIAGNOSIS — C251 Malignant neoplasm of body of pancreas: Secondary | ICD-10-CM

## 2014-09-18 DIAGNOSIS — C787 Secondary malignant neoplasm of liver and intrahepatic bile duct: Secondary | ICD-10-CM | POA: Diagnosis not present

## 2014-09-18 DIAGNOSIS — C259 Malignant neoplasm of pancreas, unspecified: Secondary | ICD-10-CM

## 2014-09-18 MED ORDER — SODIUM CHLORIDE 0.9 % IJ SOLN
10.0000 mL | INTRAMUSCULAR | Status: DC | PRN
  Administered 2014-09-18: 10 mL
  Filled 2014-09-18: qty 10

## 2014-09-18 MED ORDER — HEPARIN SOD (PORK) LOCK FLUSH 100 UNIT/ML IV SOLN
500.0000 [IU] | Freq: Once | INTRAVENOUS | Status: AC | PRN
  Administered 2014-09-18: 500 [IU]
  Filled 2014-09-18: qty 5

## 2014-09-23 ENCOUNTER — Telehealth: Payer: Self-pay | Admitting: *Deleted

## 2014-09-23 NOTE — Telephone Encounter (Signed)
She called to let nurse she drank the warm prune juice and took #2 Senna yesterday afternoon and had 4-5 good BM's after that. She had called yesterday to report constipation and this was suggestion of RN. She is getting ready for her trip to [place] this weekend. Leaves tomorrow. Asking RN if OK for her to eat fish or shrimp-someone told her not to. Told her OK to eat fish and shrimp as long as they are cooked.

## 2014-09-28 ENCOUNTER — Telehealth: Payer: Self-pay | Admitting: Hematology

## 2014-09-28 NOTE — Telephone Encounter (Signed)
pt cld left voicemail wantign to know what time appt was-cld & left a message and adv pt of time & dtae for 5/12 appt

## 2014-09-29 ENCOUNTER — Telehealth: Payer: Self-pay | Admitting: *Deleted

## 2014-09-29 NOTE — Telephone Encounter (Signed)
Left VM asking if OK for her to visit a friend who has been on antibiotics x 2 weeks and blood thinners? Called back and left her message to inquire about what is the diagnosis that causes her to need antibiotic.

## 2014-09-30 ENCOUNTER — Ambulatory Visit (HOSPITAL_BASED_OUTPATIENT_CLINIC_OR_DEPARTMENT_OTHER): Payer: PPO

## 2014-09-30 ENCOUNTER — Other Ambulatory Visit: Payer: PPO

## 2014-09-30 ENCOUNTER — Other Ambulatory Visit (HOSPITAL_BASED_OUTPATIENT_CLINIC_OR_DEPARTMENT_OTHER): Payer: PPO

## 2014-09-30 ENCOUNTER — Encounter: Payer: Self-pay | Admitting: Hematology

## 2014-09-30 ENCOUNTER — Ambulatory Visit (HOSPITAL_BASED_OUTPATIENT_CLINIC_OR_DEPARTMENT_OTHER): Payer: PPO | Admitting: Hematology

## 2014-09-30 VITALS — BP 142/83 | HR 87 | Temp 98.2°F | Resp 18 | Ht 64.5 in | Wt 222.3 lb

## 2014-09-30 DIAGNOSIS — C251 Malignant neoplasm of body of pancreas: Secondary | ICD-10-CM

## 2014-09-30 DIAGNOSIS — C787 Secondary malignant neoplasm of liver and intrahepatic bile duct: Secondary | ICD-10-CM

## 2014-09-30 DIAGNOSIS — C259 Malignant neoplasm of pancreas, unspecified: Secondary | ICD-10-CM

## 2014-09-30 DIAGNOSIS — Z5111 Encounter for antineoplastic chemotherapy: Secondary | ICD-10-CM

## 2014-09-30 DIAGNOSIS — D696 Thrombocytopenia, unspecified: Secondary | ICD-10-CM | POA: Diagnosis not present

## 2014-09-30 LAB — CBC WITH DIFFERENTIAL/PLATELET
BASO%: 1.1 % (ref 0.0–2.0)
BASOS ABS: 0.1 10*3/uL (ref 0.0–0.1)
EOS%: 2.5 % (ref 0.0–7.0)
Eosinophils Absolute: 0.1 10*3/uL (ref 0.0–0.5)
HEMATOCRIT: 38.1 % (ref 34.8–46.6)
HGB: 12.6 g/dL (ref 11.6–15.9)
LYMPH#: 1.3 10*3/uL (ref 0.9–3.3)
LYMPH%: 24.5 % (ref 14.0–49.7)
MCH: 28.7 pg (ref 25.1–34.0)
MCHC: 33.1 g/dL (ref 31.5–36.0)
MCV: 86.8 fL (ref 79.5–101.0)
MONO#: 0.6 10*3/uL (ref 0.1–0.9)
MONO%: 11.7 % (ref 0.0–14.0)
NEUT#: 3.1 10*3/uL (ref 1.5–6.5)
NEUT%: 60.2 % (ref 38.4–76.8)
Platelets: 83 10*3/uL — ABNORMAL LOW (ref 145–400)
RBC: 4.39 10*6/uL (ref 3.70–5.45)
RDW: 15 % — AB (ref 11.2–14.5)
WBC: 5.2 10*3/uL (ref 3.9–10.3)

## 2014-09-30 LAB — COMPREHENSIVE METABOLIC PANEL (CC13)
ALK PHOS: 243 U/L — AB (ref 40–150)
ALT: 86 U/L — AB (ref 0–55)
AST: 71 U/L — AB (ref 5–34)
Albumin: 3.5 g/dL (ref 3.5–5.0)
Anion Gap: 11 mEq/L (ref 3–11)
BILIRUBIN TOTAL: 0.56 mg/dL (ref 0.20–1.20)
BUN: 12.3 mg/dL (ref 7.0–26.0)
CO2: 29 mEq/L (ref 22–29)
CREATININE: 0.9 mg/dL (ref 0.6–1.1)
Calcium: 9.3 mg/dL (ref 8.4–10.4)
Chloride: 100 mEq/L (ref 98–109)
EGFR: 64 mL/min/{1.73_m2} — ABNORMAL LOW (ref >90)
Glucose: 393 mg/dl — ABNORMAL HIGH (ref 70–140)
Potassium: 4.1 mEq/L (ref 3.5–5.1)
SODIUM: 140 meq/L (ref 136–145)
TOTAL PROTEIN: 6.1 g/dL — AB (ref 6.4–8.3)

## 2014-09-30 MED ORDER — DEXAMETHASONE SODIUM PHOSPHATE 100 MG/10ML IJ SOLN
Freq: Once | INTRAMUSCULAR | Status: AC
  Administered 2014-09-30: 15:00:00 via INTRAVENOUS
  Filled 2014-09-30: qty 4

## 2014-09-30 MED ORDER — LEUCOVORIN CALCIUM INJECTION 350 MG
401.0000 mg/m2 | Freq: Once | INTRAVENOUS | Status: AC
  Administered 2014-09-30: 850 mg via INTRAVENOUS
  Filled 2014-09-30: qty 42.5

## 2014-09-30 MED ORDER — SODIUM CHLORIDE 0.9 % IV SOLN
2040.0000 mg/m2 | INTRAVENOUS | Status: DC
  Administered 2014-09-30: 4300 mg via INTRAVENOUS
  Filled 2014-09-30: qty 86

## 2014-09-30 MED ORDER — DEXTROSE 5 % IV SOLN
Freq: Once | INTRAVENOUS | Status: AC
  Administered 2014-09-30: 15:00:00 via INTRAVENOUS

## 2014-09-30 MED ORDER — OXALIPLATIN CHEMO INJECTION 100 MG/20ML
70.0000 mg/m2 | Freq: Once | INTRAVENOUS | Status: AC
  Administered 2014-09-30: 150 mg via INTRAVENOUS
  Filled 2014-09-30: qty 30

## 2014-09-30 NOTE — Progress Notes (Signed)
Per Dr. Burr Medico, okay to tx with platelets 83

## 2014-09-30 NOTE — Progress Notes (Signed)
[facility]  [phone] [phone]  Clinic New Consult Note   Patient Care Team: Harlan Stains, MD as PCP - General (Family Medicine) Truitt Merle, MD as Consulting Physician (Hematology and Oncology) Delrae Rend, MD as Consulting Physician (Endocrinology) 09/30/2014  CHIEF COMPLAINTS/PURPOSE OF CONSULTATION:  Thrombocytopenia and a newly diagnosed metastatic pancreas cancer  Oncology History   Pancreatic cancer metastasized to liver   Staging form: Pancreas, AJCC 7th Edition     Clinical: Stage IV (T2, NX, M1) - Unsigned       Pancreatic cancer metastasized to liver   08/10/2014 Imaging CT abdomen showed 5.4 x 3.2 cm pancreatic body mass consistent with pancreatic adenocarcinoma. There is adjacent lymphadenopathy and diffuse  hepatic metastatic disease.   08/17/2014 Pathology Results Liver biopsy showed adenocarcinoma, consistent with pancreatic primary.   08/17/2014 Initial Diagnosis Pancreatic cancer metastasized to liver   08/24/2014 Tumor Marker CA19.9 >140000   08/31/2014 Imaging CT chest was negative for    HISTORY OF PRESENTING ILLNESS:  Amber Bishop 64 y.o. female was referred by her primary care physician Dr. Dema Severin for thrombocytopenia. She also was recently diagnosed with metastatic pancreatic cancer.   She has had a chronic thrombocytopenia since 2013. Her plt count has been in the range of 100-150, but dropped to the range of 80s lately. She denies any signs of bleeding. No history of liver disease.  She has been diabetic for 24 years, and her blood glucose has been out of control in the past one year. She also reports fatigue in the past one year. She denies any significant abdominal pain, jaundice, bloating, or change of her bowel habits. Her appetite has been normal. No nausea or vomiting. She is able to do all selfcare, but she sleeps more than usually during the day. No abdominal pain or bloating. She was found to have abnormal LFTs  on routine lab work, and her PCP Dr. Dema Severin, who ordered and abdominal US which lead to a CT scan, which showed a 5.4 x 3.4 cm pancreatic body mass, adjacent lymphadenopathy, and diffuse hepatic metastasis. She underwent ultrasound-guided liver biopsy on 08/17/2014, and the biopsy showed adenocarcinoma consistent with pancreatic primary.   INTERIM HISTORY: Amber Bishop returns for follow-up. She visit her mother and brothers in [place] last weekend and really enjoyed it. She is doing well, had no significant side effects from last cycle chemo, she had mild to moderate fatigue, able to function well at home, no fever or bleeding.   MEDICAL HISTORY:  Past Medical History  Diagnosis Date  . Anxiety   . Hypertension   . IBS (irritable bowel syndrome)   . Hyperplastic colon polyp   . Allergy   . Anemia   . Arthritis   . Diverticulosis   . Dilated aortic root     seen on prior echo but echo 05/2013 showed normal dimensions  . Bicuspid aortic valve   . LVE (left ventricular enlargement)     mild by echo 1/15 with EF 50-55%  . pancreatic ca w/ liver mets dx'd 07/2014    SURGICAL HISTORY: Past Surgical History  Procedure Laterality Date  . Tubal ligation      age 40  . Colonoscopy    . Polypectomy    . Tooth extraction      3 teeth  . Belpharoptosis repair      SOCIAL HISTORY: History   Social History  . Marital Status: Single    Spouse Name: N/A  . Number of  Children: 1 son   . Years of Education: N/A   Occupational History  . Retired home care [employer]  . Smoking status: Never Smoker   . Smokeless tobacco: Never Used  . Alcohol Use: No  . Drug Use: No  . Sexual Activity: Not on file   Other Topics Concern  . Not on file   Social History Narrative    FAMILY HISTORY: Family History  Problem Relation Age of Onset  . Colon cancer Neg Hx   . Esophageal cancer Neg Hx   . Stomach cancer Neg Hx   . Asthma Mother     ALLERGIES:  is allergic to naprosyn  and metformin and related.  MEDICATIONS:  Current Outpatient Prescriptions  Medication Sig Dispense Refill  . ALPRAZolam (XANAX) 0.5 MG tablet TAKE 1 TABLET THREE TIMES DAILY IF NEEDED FOR ANXIETY 90 tablet 3  . amLODipine (NORVASC) 5 MG tablet Take 5 mg by mouth every morning.     . blood glucose meter kit and supplies KIT Dispense based on patient and insurance preference. Use up to four times daily as directed. Dx code: E11.65 1 each 0  . Calcium Carbonate-Vitamin D (CALCIUM 600/VITAMIN D) 600-400 MG-UNIT per tablet Take 1 tablet by mouth 2 (two) times daily.     . cetirizine (ZYRTEC) 10 MG tablet Take 10 mg by mouth daily.    Marland Kitchen FLUoxetine (PROZAC) 20 MG capsule Take 20 mg by mouth every morning.     Marland Kitchen glucose blood (ACCU-CHEK AVIVA PLUS) test strip Test 4 times daily as instructed. 125 each 3  . insulin aspart (NOVOLOG FLEXPEN) 100 UNIT/ML FlexPen Inject 14-18 units into the skin 3 times daily as instructed. (Patient taking differently: Inject 20-24 Units into the skin 3 (three) times daily with meals. 80-99 20units 100-199 21 units 200-299 22 units  300 or greater 24 units) 15 mL 2  . insulin glargine (LANTUS) 100 UNIT/ML injection Inject 52 Units into the skin at bedtime.    . Insulin Pen Needle (VALUMARK PEN NEEDLES) 31G X 8 MM MISC Use to inject insulin 4 times daily as instructed. 150 each 3  . LANCETS ULTRA THIN 30G MISC Use to test blood sugar 4 times daily as instructed. Dx code: E11.65 200 each 2  . lidocaine-prilocaine (EMLA) cream Apply 1 application topically as needed. 30 g 3  . meloxicam (MOBIC) 15 MG tablet Take 15 mg by mouth every morning.     . Multiple Vitamins-Minerals (CENTRUM SILVER PO) Take 1 capsule by mouth every morning.     . Multiple Vitamins-Minerals (ICAPS MV PO) Take 1 capsule by mouth 2 (two) times daily.     . Omega-3 Fatty Acids (FISH OIL) 1000 MG CAPS Take 1 capsule by mouth every morning.     . ondansetron (ZOFRAN) 8 MG tablet Take 1 tablet (8 mg total) by  mouth 2 (two) times daily. Start the day after chemo for 2 days. Then take as needed for nausea or vomiting. 30 tablet 1  . prochlorperazine (COMPAZINE) 10 MG tablet Take 1 tablet (10 mg total) by mouth every 6 (six) hours as needed for nausea or vomiting. 30 tablet 0  . valsartan-hydrochlorothiazide (DIOVAN-HCT) 320-12.5 MG per tablet Take 1 tablet by mouth every morning.     . cholestyramine (QUESTRAN) 4 G packet Take 1/2-1 packet dissolved in water/juice once daily....................Marland KitchenPRN (Patient not taking: Reported on 09/30/2014) 90 each 3  . dextromethorphan (DELSYM) 30 MG/5ML liquid Take  15 mg by mouth 2 (two) times daily as needed for cough.    Marland Kitchen PREVNAR 13 SUSP injection Inject 0.5 mLs into the muscle once.   0   No current facility-administered medications for this visit.   Facility-Administered Medications Ordered in Other Visits  Medication Dose Route Frequency Provider Last Rate Last Dose  . fluorouracil (ADRUCIL) 4,300 mg in sodium chloride 0.9 % 64 mL chemo infusion  2,040 mg/m2 (Treatment Plan Actual) Intravenous 1 day or 1 dose Truitt Merle, MD   4,300 mg at 09/30/14 1742    REVIEW OF SYSTEMS:   Constitutional: Denies fevers, chills or abnormal night sweats Eyes: Denies blurriness of vision, double vision or watery eyes Ears, nose, mouth, throat, and face: Denies mucositis or sore throat Respiratory: Denies cough, dyspnea or wheezes Cardiovascular: Denies palpitation, chest discomfort or lower extremity swelling Gastrointestinal:  Denies nausea, heartburn or change in bowel habits Skin: Denies abnormal skin rashes Lymphatics: Denies new lymphadenopathy or easy bruising Neurological:Denies numbness, tingling or new weaknesses Behavioral/Psych: Mood is stable, no new changes  All other systems were reviewed with the patient and are negative.   PHYSICAL EXAMINATION: ECOG PERFORMANCE STATUS: 1 - Symptomatic but completely ambulatory  Filed Vitals:   09/30/14 1403  BP: 142/83    Pulse: 87  Temp: 98.2 F (36.8 C)  Resp: 18   Filed Weights   09/30/14 1403  Weight: 222 lb 4.8 oz (100.835 kg)    GENERAL:alert, no distress and comfortable SKIN: skin color, texture, turgor are normal, no rashes or significant lesions EYES: normal, conjunctiva are pink and non-injected, sclera clear OROPHARYNX:no exudate, no erythema and lips, buccal mucosa, and tongue normal  NECK: supple, thyroid normal size, non-tender, without nodularity LYMPH:  no palpable lymphadenopathy in the cervical, axillary or inguinal LUNGS: clear to auscultation and percussion with normal breathing effort HEART: regular rate & rhythm and no murmurs and no lower extremity edema ABDOMEN:abdomen soft, non-tender and normal bowel sounds Musculoskeletal:no cyanosis of digits and no clubbing  PSYCH: alert & oriented x 3 with fluent speech NEURO: no focal motor/sensory deficits  LABORATORY DATA:  I have reviewed the data as listed CBC Latest Ref Rng 09/30/2014 09/16/2014 09/09/2014  WBC 3.9 - 10.3 10e3/uL 5.2 5.6 7.6  Hemoglobin 11.6 - 15.9 g/dL 12.6 12.3 12.3  Hematocrit 34.8 - 46.6 % 38.1 38.7 36.9  Platelets 145 - 400 10e3/uL 83(L) 119(L) 78(L)    CMP Latest Ref Rng 09/30/2014 09/16/2014 09/09/2014  Glucose 70 - 140 mg/dl 393(H) 424(H) 196(H)  BUN 7.0 - 26.0 mg/dL 12.3 12.8 11.4  Creatinine 0.6 - 1.1 mg/dL 0.9 1.0 0.7  Sodium 136 - 145 mEq/L 140 137 138  Potassium 3.5 - 5.1 mEq/L 4.1 4.2 3.5  Chloride 96 - 112 mEq/L - - -  CO2 22 - 29 mEq/L 29 27 24   Calcium 8.4 - 10.4 mg/dL 9.3 9.2 8.4  Total Protein 6.4 - 8.3 g/dL 6.1(L) 6.1(L) 6.0(L)  Total Bilirubin 0.20 - 1.20 mg/dL 0.56 0.68 0.62  Alkaline Phos 40 - 150 U/L 243(H) 255(H) 236(H)  AST 5 - 34 U/L 71(H) 79(H) 104(H)  ALT 0 - 55 U/L 86(H) 111(H) 137(H)    PATHOLOGY REPORT: Diagnosis 08/17/2014 Liver, needle/core biopsy, left - ADENOCARCINOMA. Microscopic Comment The morphologic features are compatible with metastatic pancreatic  adenocarcinoma.  RADIOGRAPHIC STUDIES: I have personally reviewed the radiological images as listed and agreed with the findings in the report.  Ct Abdomen Pelvis W Wo Contrast 08/10/2014    IMPRESSION: 1.  5.4 x 3.2 cm pancreatic body mass consistent with pancreatic adenocarcinoma. There is adjacent lymphadenopathy and diffuse hepatic metastatic disease. The portal vein is patent. 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 2. Very geographic fatty infiltration of the liver. 3. Left-sided biliary dilatation. There is likely meta tumor compressing the left hepatic duct. 4. Uterine fibroids.   Electronically Signed   By: Marijo Sanes M.D.   On: 08/10/2014 14:14      CT chet 08/31/2014 IMPRESSION: No findings specific for metastatic disease in the chest.  3 mm right lower lobe pulmonary nodule favors a benign subpleural lymph node. Attention on follow-up is suggested.  3.1 x 4.6 cm mass in the pancreatic body, corresponding to known pancreatic cancer.  Multifocal hepatic metastases, incompletely visualized.   ASSESSMENT & PLAN:  64 year old Caucasian female, with past medical history of diabetes, hypertension, IBS, arthritis, who was found to have worsening thrombocytopenia, and a newly diagnosed metastatic rectal cancer.  1. Pancreatic cancer with liver metastasis -I reviewed her CT chest, abdomen and pelvis scan findings in the liver mass biopsy results in great detail. The image was reviewed in person. -I discussed the incurable nature of metastatic pancreas cancer and overall poor prognosis. However I am little concerned about how much she really grasp about this. She does have very positive  Attitude  -I recommend systemic chemotherapy. The goal of treatment is palliation and to prolong her life. -I discussed the option of single agent gemcitabine, gemcitabine with Abraxane, FOLFOX or FOLFIRINOX -Due to her significant thrombocytopenia, I think  gemcitabine-based regimen would be difficult to tolerate. I recommend a FOLFOX as first line therapy. Side effects of chemotherapy therapy were discussed with patient and she agrees to proceed. -Giving her moderate thrombocytopenia, I'll dose reduce 15% and omit the 5 FU bolus. -Lab reviewed, her liver functions slightly improved, we'll proceed with third cycle FOLFOX with 15% dose reduction due to thrombocytopenia   2. Thrombocytopenia -This appears to be chronic, she has no history of liver disease, CT scan did not reveal liver cirrhosis or spleen medically. 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 If her platelet count drops significantly after chemotherapy, I'll try a course of prednisone.  -As a bone marrow disease is also possible. Giving the overall poor prognosis from a metastatic pancreas cancer, I did not recommend a bone marrow biopsy at this point.  3. Diabetes, hypertension, IBS, anxiety -She will continue follow-up with her primary care physician -Her blood glucose has been poorly controlled lately, I recommend her monitoring her sugar closely at home and contact her primary care physician for her insulin dose adjustment  4. Social support -She lives alone, her ex-husband is supportive. She knows to call for help if needed  Plan -second cycle FOLFOX today with 15% dose reduction on oxaliplatin and no 5-FU bolus -Return to clinic in two weeks for follow-up and lab. -She is planning to travel next weekend   All questions were answered. The patient knows to call the clinic with any problems, questions or concerns. I spent 20 minutes counseling the patient face to face. The total time spent in the appointment was 30 minutes and more than 50% was on counseling.      Truitt Merle, MD 09/30/2014 6:11 PM

## 2014-09-30 NOTE — Patient Instructions (Signed)
[facility] Discharge Instructions for Patients Receiving Chemotherapy  Today you received the following chemotherapy agents FOLFOX To help prevent nausea and vomiting after your treatment, we encourage you to take your nausea medication as prescribed.  If you develop nausea and vomiting that is not controlled by your nausea medication, call the clinic.   BELOW ARE SYMPTOMS THAT SHOULD BE REPORTED IMMEDIATELY:  *FEVER GREATER THAN 100.5 F  *CHILLS WITH OR WITHOUT FEVER  NAUSEA AND VOMITING THAT IS NOT CONTROLLED WITH YOUR NAUSEA MEDICATION  *UNUSUAL SHORTNESS OF BREATH  *UNUSUAL BRUISING OR BLEEDING  TENDERNESS IN MOUTH AND THROAT WITH OR WITHOUT PRESENCE OF ULCERS  *URINARY PROBLEMS  *BOWEL PROBLEMS  UNUSUAL RASH Items with * indicate a potential emergency and should be followed up as soon as possible.  Feel free to call the clinic you have any questions or concerns. The clinic phone number is [phone].  Please show the [facility] at check-in to the Emergency Department and triage nurse.

## 2014-10-01 ENCOUNTER — Telehealth: Payer: Self-pay | Admitting: Hematology

## 2014-10-01 ENCOUNTER — Encounter: Payer: Self-pay | Admitting: *Deleted

## 2014-10-01 NOTE — Progress Notes (Signed)
[facility] Work  Clinical Social Work received needed documents from son and sent in cancer care application on behalf of pt for assistance. Pt's copay for her chemo was stated to be $2.16 currently, so co-pay assistance is not currently indicated at this time. CSW updated son of current status of said applications via phone. CSW will continue to follow and assist as needed.   Clinical Social Work interventions: Resource assistance  Loren Racer, [place] Worker [place]  [place] [place] Phone: [phone] Fax: [phone]

## 2014-10-01 NOTE — Telephone Encounter (Signed)
Added appointments for 5/26, 5/28, 6/9 and 6/11. Left message for patient re new appointments and patient to get new schedule when she comes in for pump d/c 5/14 (sat).

## 2014-10-02 ENCOUNTER — Ambulatory Visit (HOSPITAL_BASED_OUTPATIENT_CLINIC_OR_DEPARTMENT_OTHER): Payer: PPO

## 2014-10-02 VITALS — BP 147/85 | HR 83 | Temp 98.6°F | Resp 18

## 2014-10-02 DIAGNOSIS — C251 Malignant neoplasm of body of pancreas: Secondary | ICD-10-CM

## 2014-10-02 DIAGNOSIS — Z452 Encounter for adjustment and management of vascular access device: Secondary | ICD-10-CM | POA: Diagnosis not present

## 2014-10-02 DIAGNOSIS — C259 Malignant neoplasm of pancreas, unspecified: Secondary | ICD-10-CM

## 2014-10-02 DIAGNOSIS — C787 Secondary malignant neoplasm of liver and intrahepatic bile duct: Secondary | ICD-10-CM | POA: Diagnosis not present

## 2014-10-02 MED ORDER — HEPARIN SOD (PORK) LOCK FLUSH 100 UNIT/ML IV SOLN
500.0000 [IU] | Freq: Once | INTRAVENOUS | Status: AC | PRN
  Administered 2014-10-02: 500 [IU]
  Filled 2014-10-02: qty 5

## 2014-10-02 MED ORDER — SODIUM CHLORIDE 0.9 % IJ SOLN
10.0000 mL | INTRAMUSCULAR | Status: DC | PRN
  Administered 2014-10-02: 10 mL
  Filled 2014-10-02: qty 10

## 2014-10-04 ENCOUNTER — Ambulatory Visit: Payer: PPO

## 2014-10-05 ENCOUNTER — Telehealth: Payer: Self-pay | Admitting: *Deleted

## 2014-10-05 NOTE — Telephone Encounter (Signed)
Left VM that she was constipated and requests call to discuss. Forwarded this message to collaborative nurse.

## 2014-10-06 ENCOUNTER — Telehealth: Payer: Self-pay | Admitting: *Deleted

## 2014-10-06 NOTE — Telephone Encounter (Signed)
Returned call to pt & she reports that the instructions of 8 oz warm prune juice & Equate brand-Senakot per Merceda Elks RN-GI navigator worked for her constipation.

## 2014-10-11 ENCOUNTER — Telehealth: Payer: Self-pay | Admitting: Hematology

## 2014-10-11 NOTE — Telephone Encounter (Signed)
pt came to get copy of sch

## 2014-10-14 ENCOUNTER — Other Ambulatory Visit: Payer: Self-pay | Admitting: *Deleted

## 2014-10-14 ENCOUNTER — Ambulatory Visit (HOSPITAL_BASED_OUTPATIENT_CLINIC_OR_DEPARTMENT_OTHER): Payer: PPO

## 2014-10-14 ENCOUNTER — Other Ambulatory Visit (HOSPITAL_BASED_OUTPATIENT_CLINIC_OR_DEPARTMENT_OTHER): Payer: PPO

## 2014-10-14 ENCOUNTER — Encounter: Payer: Self-pay | Admitting: Physician Assistant

## 2014-10-14 ENCOUNTER — Ambulatory Visit (HOSPITAL_BASED_OUTPATIENT_CLINIC_OR_DEPARTMENT_OTHER): Payer: PPO | Admitting: Physician Assistant

## 2014-10-14 VITALS — BP 146/78 | HR 86 | Temp 97.6°F | Resp 19 | Ht 64.0 in | Wt 219.0 lb

## 2014-10-14 DIAGNOSIS — C251 Malignant neoplasm of body of pancreas: Secondary | ICD-10-CM | POA: Diagnosis not present

## 2014-10-14 DIAGNOSIS — C259 Malignant neoplasm of pancreas, unspecified: Secondary | ICD-10-CM

## 2014-10-14 DIAGNOSIS — D696 Thrombocytopenia, unspecified: Secondary | ICD-10-CM

## 2014-10-14 DIAGNOSIS — C787 Secondary malignant neoplasm of liver and intrahepatic bile duct: Secondary | ICD-10-CM

## 2014-10-14 DIAGNOSIS — Z5111 Encounter for antineoplastic chemotherapy: Secondary | ICD-10-CM | POA: Diagnosis not present

## 2014-10-14 DIAGNOSIS — C257 Malignant neoplasm of other parts of pancreas: Secondary | ICD-10-CM

## 2014-10-14 LAB — CBC WITH DIFFERENTIAL/PLATELET
BASO%: 1.1 % (ref 0.0–2.0)
BASOS ABS: 0.1 10*3/uL (ref 0.0–0.1)
EOS%: 3.4 % (ref 0.0–7.0)
Eosinophils Absolute: 0.2 10*3/uL (ref 0.0–0.5)
HEMATOCRIT: 42.2 % (ref 34.8–46.6)
HEMOGLOBIN: 13.7 g/dL (ref 11.6–15.9)
LYMPH#: 1.1 10*3/uL (ref 0.9–3.3)
LYMPH%: 18.2 % (ref 14.0–49.7)
MCH: 28.1 pg (ref 25.1–34.0)
MCHC: 32.5 g/dL (ref 31.5–36.0)
MCV: 86.3 fL (ref 79.5–101.0)
MONO#: 0.8 10*3/uL (ref 0.1–0.9)
MONO%: 13.9 % (ref 0.0–14.0)
NEUT#: 3.8 10*3/uL (ref 1.5–6.5)
NEUT%: 63.4 % (ref 38.4–76.8)
Platelets: 79 10*3/uL — ABNORMAL LOW (ref 145–400)
RBC: 4.89 10*6/uL (ref 3.70–5.45)
RDW: 15.8 % — ABNORMAL HIGH (ref 11.2–14.5)
WBC: 5.9 10*3/uL (ref 3.9–10.3)

## 2014-10-14 LAB — COMPREHENSIVE METABOLIC PANEL (CC13)
ALT: 102 U/L — AB (ref 0–55)
ANION GAP: 13 meq/L — AB (ref 3–11)
AST: 88 U/L — AB (ref 5–34)
Albumin: 3.4 g/dL — ABNORMAL LOW (ref 3.5–5.0)
Alkaline Phosphatase: 234 U/L — ABNORMAL HIGH (ref 40–150)
BUN: 11.9 mg/dL (ref 7.0–26.0)
CO2: 25 meq/L (ref 22–29)
Calcium: 8.8 mg/dL (ref 8.4–10.4)
Chloride: 104 mEq/L (ref 98–109)
Creatinine: 0.8 mg/dL (ref 0.6–1.1)
EGFR: 76 mL/min/{1.73_m2} — ABNORMAL LOW (ref >90)
Glucose: 159 mg/dl — ABNORMAL HIGH (ref 70–140)
Potassium: 3.8 mEq/L (ref 3.5–5.1)
Sodium: 141 mEq/L (ref 136–145)
Total Bilirubin: 0.66 mg/dL (ref 0.20–1.20)
Total Protein: 6.4 g/dL (ref 6.4–8.3)

## 2014-10-14 MED ORDER — DEXTROSE 5 % IV SOLN
Freq: Once | INTRAVENOUS | Status: AC
  Administered 2014-10-14: 11:00:00 via INTRAVENOUS

## 2014-10-14 MED ORDER — DEXAMETHASONE SODIUM PHOSPHATE 100 MG/10ML IJ SOLN
Freq: Once | INTRAMUSCULAR | Status: AC
  Administered 2014-10-14: 11:00:00 via INTRAVENOUS
  Filled 2014-10-14: qty 4

## 2014-10-14 MED ORDER — LEUCOVORIN CALCIUM INJECTION 350 MG
401.0000 mg/m2 | Freq: Once | INTRAVENOUS | Status: AC
  Administered 2014-10-14: 850 mg via INTRAVENOUS
  Filled 2014-10-14: qty 42.5

## 2014-10-14 MED ORDER — DEXTROSE 5 % IV SOLN
70.0000 mg/m2 | Freq: Once | INTRAVENOUS | Status: AC
  Administered 2014-10-14: 150 mg via INTRAVENOUS
  Filled 2014-10-14: qty 30

## 2014-10-14 MED ORDER — PREDNISONE 20 MG PO TABS: 60.0000 mg | ORAL_TABLET | Freq: Three times a day (TID) | ORAL | Status: DC

## 2014-10-14 MED ORDER — SODIUM CHLORIDE 0.9 % IV SOLN
2040.0000 mg/m2 | INTRAVENOUS | Status: DC
  Administered 2014-10-14: 4300 mg via INTRAVENOUS
  Filled 2014-10-14: qty 86

## 2014-10-14 NOTE — Patient Instructions (Signed)
[facility] Discharge Instructions for Patients Receiving Chemotherapy  Today you received the following chemotherapy agents;  Oxaliplatin, Leucovorin and 5-FU.    To help prevent nausea and vomiting after your treatment, we encourage you to take your nausea medication as directed.    If you develop nausea and vomiting that is not controlled by your nausea medication, call the clinic.   BELOW ARE SYMPTOMS THAT SHOULD BE REPORTED IMMEDIATELY:  *FEVER GREATER THAN 100.5 F  *CHILLS WITH OR WITHOUT FEVER  NAUSEA AND VOMITING THAT IS NOT CONTROLLED WITH YOUR NAUSEA MEDICATION  *UNUSUAL SHORTNESS OF BREATH  *UNUSUAL BRUISING OR BLEEDING  TENDERNESS IN MOUTH AND THROAT WITH OR WITHOUT PRESENCE OF ULCERS  *URINARY PROBLEMS  *BOWEL PROBLEMS  UNUSUAL RASH Items with * indicate a potential emergency and should be followed up as soon as possible.  Feel free to call the clinic you have any questions or concerns. The clinic phone number is [phone].  Please show the [facility] at check-in to the Emergency Department and triage nurse.

## 2014-10-14 NOTE — Progress Notes (Signed)
Ok to treat per Dr. Burr Medico with low Platelet count and elevated AST/ALT today.

## 2014-10-14 NOTE — Progress Notes (Signed)
[facility]  [phone] [phone]) [phone]  Clinic  Note   Patient Care Team: Harlan Stains, MD as PCP - General (Family Medicine) Truitt Merle, MD as Consulting Physician (Hematology and Oncology) Delrae Rend, MD as Consulting Physician (Endocrinology) 10/14/2014  CHIEF COMPLAINTS/PURPOSE OF CONSULTATION:  Thrombocytopenia and a newly diagnosed metastatic pancreas cancer  Oncology History   Pancreatic cancer metastasized to liver   Staging form: Pancreas, AJCC 7th Edition     Clinical: Stage IV (T2, NX, M1) - Unsigned       Pancreatic cancer metastasized to liver   08/10/2014 Imaging CT abdomen showed 5.4 x 3.2 cm pancreatic body mass consistent with pancreatic adenocarcinoma. There is adjacent lymphadenopathy and diffuse  hepatic metastatic disease.   08/17/2014 Pathology Results Liver biopsy showed adenocarcinoma, consistent with pancreatic primary.   08/17/2014 Initial Diagnosis Pancreatic cancer metastasized to liver   08/24/2014 Tumor Marker CA19.9 >140000   08/31/2014 Imaging CT chest was negative for    HISTORY OF PRESENTING ILLNESS:  Amber Bishop 64 y.o. female was referred by her primary care physician Dr. Dema Severin for thrombocytopenia. She also was recently diagnosed with metastatic pancreatic cancer.   She has had a chronic thrombocytopenia since 2013. Her plt count has been in the range of 100-150, but dropped to the range of 80s lately. She denies any signs of bleeding. No history of liver disease.  She has been diabetic for 24 years, and her blood glucose has been out of control in the past one year. She also reports fatigue in the past one year. She denies any significant abdominal pain, jaundice, bloating, or change of her bowel habits. Her appetite has been normal. No nausea or vomiting. She is able to do all selfcare, but she sleeps more than usually during the day. No abdominal pain or bloating. She was found to have abnormal LFTs on routine  lab work, and her PCP Dr. Dema Severin, who ordered and abdominal US which lead to a CT scan, which showed a 5.4 x 3.4 cm pancreatic body mass, adjacent lymphadenopathy, and diffuse hepatic metastasis. She underwent ultrasound-guided liver biopsy on 08/17/2014, and the biopsy showed adenocarcinoma consistent with pancreatic primary.   INTERIM HISTORY: Rudi returns for follow-up. She is accompanied by a friend today. She is doing well, had no significant side effects from last cycle chemo, she had mild to moderate fatigue, able to function well at home, no fever or bleeding.   MEDICAL HISTORY:  Past Medical History  Diagnosis Date  . Anxiety   . Hypertension   . IBS (irritable bowel syndrome)   . Hyperplastic colon polyp   . Allergy   . Anemia   . Arthritis   . Diverticulosis   . Dilated aortic root     seen on prior echo but echo 05/2013 showed normal dimensions  . Bicuspid aortic valve   . LVE (left ventricular enlargement)     mild by echo 1/15 with EF 50-55%  . pancreatic ca w/ liver mets dx'd 07/2014    SURGICAL HISTORY: Past Surgical History  Procedure Laterality Date  . Tubal ligation      age 70  . Colonoscopy    . Polypectomy    . Tooth extraction      3 teeth  . Belpharoptosis repair      SOCIAL HISTORY: History   Social History  . Marital Status: Single    Spouse Name: N/A  . Number of Children: 1 son   . Years of  Education: N/A   Occupational History  . Retired home care [employer]  . Smoking status: Never Smoker   . Smokeless tobacco: Never Used  . Alcohol Use: No  . Drug Use: No  . Sexual Activity: Not on file   Other Topics Concern  . Not on file   Social History Narrative    FAMILY HISTORY: Family History  Problem Relation Age of Onset  . Colon cancer Neg Hx   . Esophageal cancer Neg Hx   . Stomach cancer Neg Hx   . Asthma Mother     ALLERGIES:  is allergic to naprosyn and metformin and related.  MEDICATIONS:    Current Outpatient Prescriptions  Medication Sig Dispense Refill  . ALPRAZolam (XANAX) 0.5 MG tablet TAKE 1 TABLET THREE TIMES DAILY IF NEEDED FOR ANXIETY 90 tablet 3  . amLODipine (NORVASC) 5 MG tablet Take 5 mg by mouth every morning.     . blood glucose meter kit and supplies KIT Dispense based on patient and insurance preference. Use up to four times daily as directed. Dx code: E11.65 1 each 0  . Calcium Carbonate-Vitamin D (CALCIUM 600/VITAMIN D) 600-400 MG-UNIT per tablet Take 1 tablet by mouth 2 (two) times daily.     . cetirizine (ZYRTEC) 10 MG tablet Take 10 mg by mouth daily.    . cholestyramine (QUESTRAN) 4 G packet Take 1/2-1 packet dissolved in water/juice once daily....................Marland KitchenPRN 90 each 3  . dextromethorphan (DELSYM) 30 MG/5ML liquid Take 15 mg by mouth 2 (two) times daily as needed for cough.    Marland Kitchen FLUoxetine (PROZAC) 20 MG capsule Take 20 mg by mouth every morning.     Marland Kitchen glucose blood (ACCU-CHEK AVIVA PLUS) test strip Test 4 times daily as instructed. 125 each 3  . insulin aspart (NOVOLOG FLEXPEN) 100 UNIT/ML FlexPen Inject 14-18 units into the skin 3 times daily as instructed. (Patient taking differently: Inject 20-24 Units into the skin 3 (three) times daily with meals. 80-99 20units 100-199 21 units 200-299 22 units  300 or greater 24 units) 15 mL 2  . insulin glargine (LANTUS) 100 UNIT/ML injection Inject 52 Units into the skin at bedtime.    . Insulin Pen Needle (VALUMARK PEN NEEDLES) 31G X 8 MM MISC Use to inject insulin 4 times daily as instructed. 150 each 3  . LANCETS ULTRA THIN 30G MISC Use to test blood sugar 4 times daily as instructed. Dx code: E11.65 200 each 2  . lidocaine-prilocaine (EMLA) cream Apply 1 application topically as needed. 30 g 3  . meloxicam (MOBIC) 15 MG tablet Take 15 mg by mouth every morning.     . Multiple Vitamins-Minerals (CENTRUM SILVER PO) Take 1 capsule by mouth every morning.     . Multiple Vitamins-Minerals (ICAPS MV PO) Take 1  capsule by mouth 2 (two) times daily.     . Omega-3 Fatty Acids (FISH OIL) 1000 MG CAPS Take 1 capsule by mouth every morning.     . ondansetron (ZOFRAN) 8 MG tablet Take 1 tablet (8 mg total) by mouth 2 (two) times daily. Start the day after chemo for 2 days. Then take as needed for nausea or vomiting. 30 tablet 1  . PREVNAR 13 SUSP injection Inject 0.5 mLs into the muscle once.   0  . prochlorperazine (COMPAZINE) 10 MG tablet Take 1 tablet (10 mg total) by mouth every 6 (six) hours as needed for nausea or vomiting. 30 tablet 0  .  valsartan-hydrochlorothiazide (DIOVAN-HCT) 320-12.5 MG per tablet Take 1 tablet by mouth every morning.      No current facility-administered medications for this visit.   Facility-Administered Medications Ordered in Other Visits  Medication Dose Route Frequency Provider Last Rate Last Dose  . fluorouracil (ADRUCIL) 4,300 mg in sodium chloride 0.9 % 64 mL chemo infusion  2,040 mg/m2 (Treatment Plan Actual) Intravenous 1 day or 1 dose Truitt Merle, MD   4,300 mg at 10/14/14 1408    REVIEW OF SYSTEMS:   Constitutional: Denies fevers, chills or abnormal night sweats Eyes: Denies blurriness of vision, double vision or watery eyes Ears, nose, mouth, throat, and face: Denies mucositis or sore throat Respiratory: Denies cough, dyspnea or wheezes Cardiovascular: Denies palpitation, chest discomfort or lower extremity swelling Gastrointestinal:  Denies nausea, heartburn or change in bowel habits Skin: Denies abnormal skin rashes Lymphatics: Denies new lymphadenopathy or easy bruising Neurological:Denies numbness, tingling or new weaknesses Behavioral/Psych: Mood is stable, no new changes  All other systems were reviewed with the patient and are negative.   PHYSICAL EXAMINATION: ECOG PERFORMANCE STATUS: 1 - Symptomatic but completely ambulatory  Filed Vitals:   10/14/14 0952  BP: 146/78  Pulse: 86  Temp: 97.6 F (36.4 C)  Resp: 19   Filed Weights   10/14/14 0952    Weight: 219 lb (99.338 kg)    GENERAL:alert, no distress and comfortable SKIN: skin color, texture, turgor are normal, no rashes or significant lesions EYES: normal, conjunctiva are pink and non-injected, sclera clear OROPHARYNX:no exudate, no erythema and lips, buccal mucosa, and tongue normal  NECK: supple, thyroid normal size, non-tender, without nodularity LYMPH:  no palpable lymphadenopathy in the cervical, axillary or inguinal LUNGS: clear to auscultation and percussion with normal breathing effort HEART: regular rate & rhythm and no murmurs and no lower extremity edema ABDOMEN:abdomen soft, non-tender and normal bowel sounds Musculoskeletal:no cyanosis of digits and no clubbing  PSYCH: alert & oriented x 3 with fluent speech NEURO: no focal motor/sensory deficits  LABORATORY DATA:  I have reviewed the data as listed CBC Latest Ref Rng 10/14/2014 09/30/2014 09/16/2014  WBC 3.9 - 10.3 10e3/uL 5.9 5.2 5.6  Hemoglobin 11.6 - 15.9 g/dL 13.7 12.6 12.3  Hematocrit 34.8 - 46.6 % 42.2 38.1 38.7  Platelets 145 - 400 10e3/uL 79(L) 83(L) 119(L)    CMP Latest Ref Rng 10/14/2014 09/30/2014 09/16/2014  Glucose 70 - 140 mg/dl 159(H) 393(H) 424(H)  BUN 7.0 - 26.0 mg/dL 11.9 12.3 12.8  Creatinine 0.6 - 1.1 mg/dL 0.8 0.9 1.0  Sodium 136 - 145 mEq/L 141 140 137  Potassium 3.5 - 5.1 mEq/L 3.8 4.1 4.2  Chloride 96 - 112 mEq/L - - -  CO2 22 - 29 mEq/L 25 29 27   Calcium 8.4 - 10.4 mg/dL 8.8 9.3 9.2  Total Protein 6.4 - 8.3 g/dL 6.4 6.1(L) 6.1(L)  Total Bilirubin 0.20 - 1.20 mg/dL 0.66 0.56 0.68  Alkaline Phos 40 - 150 U/L 234(H) 243(H) 255(H)  AST 5 - 34 U/L 88(H) 71(H) 79(H)  ALT 0 - 55 U/L 102(H) 86(H) 111(H)    PATHOLOGY REPORT: Diagnosis 08/17/2014 Liver, needle/core biopsy, left - ADENOCARCINOMA. Microscopic Comment The morphologic features are compatible with metastatic pancreatic adenocarcinoma.  RADIOGRAPHIC STUDIES: I have personally reviewed the radiological images as listed and  agreed with the findings in the report.  Ct Abdomen Pelvis W Wo Contrast 08/10/2014    IMPRESSION: 1. 5.4 x 3.2 cm pancreatic body mass consistent with pancreatic adenocarcinoma. There is adjacent lymphadenopathy and  diffuse hepatic metastatic disease. The portal vein is patent. 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 2. Very geographic fatty infiltration of the liver. 3. Left-sided biliary dilatation. There is likely meta tumor compressing the left hepatic duct. 4. Uterine fibroids.   Electronically Signed   By: Marijo Sanes M.D.   On: 08/10/2014 14:14      CT chet 08/31/2014 IMPRESSION: No findings specific for metastatic disease in the chest.  3 mm right lower lobe pulmonary nodule favors a benign subpleural lymph node. Attention on follow-up is suggested.  3.1 x 4.6 cm mass in the pancreatic body, corresponding to known pancreatic cancer.  Multifocal hepatic metastases, incompletely visualized.   ASSESSMENT & PLAN:  64 year old Caucasian female, with past medical history of diabetes, hypertension, IBS, arthritis, who was found to have worsening thrombocytopenia, and a newly diagnosed metastatic rectal cancer.  1. Pancreatic cancer with liver metastasis -She is currently being treated with systemic chemotherapy. The goal of treatment is palliation and to prolong her life. -Dr. Burr Medico discussed the option of single agent gemcitabine, gemcitabine with Abraxane, FOLFOX or FOLFIRINOX -Due to her significant thrombocytopenia, gemcitabine-based regimen would be difficult to tolerate. Dr. Burr Medico recommended a FOLFOX as first line therapy. Side effects of chemotherapy therapy were discussed with patient and she agreed to proceed. -Giving her moderate thrombocytopenia, Dr. Burr Medico dose reduced by 15% and omit the 5 FU bolus. -Lab reviewed, her liver functions slightly improved, we'll proceed with fourth cycle FOLFOX with 15% dose reduction due to thrombocytopenia     2. Thrombocytopenia -This appears to be chronic, she has no history of liver disease, CT scan did not reveal liver cirrhosis or spleen medically. 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  After review with Dr. Burr Medico, the patient wiil be placed on prednisone 60 mg by mouth daily for the next 2 weeks. She is advised to follow her insulin sliding scale and monitor her blood sugars very closely. She will also be placed on a PPI while she is on prednisone therapy. -As a bone marrow disease is also possible. Giving the overall poor prognosis from a metastatic pancreas cancer,Dr. Burr Medico did not recommend a bone marrow biopsy at this point.  3. Diabetes, hypertension, IBS, anxiety -She will continue follow-up with her primary care physician -Her blood glucose has been a bit better controlled lately, She is again advised to continue monitoring her sugar closely at home and contact her primary care physician for her insulin dose adjustment  4. Social support -She lives alone, her ex-husband is supportive. She knows to call for help if needed  Plan -fourth cycle FOLFOX today with 15% dose reduction on oxaliplatin and no 5-FU bolus -Return to clinic in two weeks for follow-up and lab.  Patient reviewed with Dr. Burr Medico.  All questions were answered. The patient knows to call the clinic with any problems, questions or concerns.  I spent 30 minutes counseling the patient face to face. The total time spent in the appointment was 40 minutes and more than 50% was on counseling.      Carlton Adam, PA-C 10/14/2014 2:50 PM

## 2014-10-15 ENCOUNTER — Other Ambulatory Visit: Payer: Self-pay | Admitting: *Deleted

## 2014-10-15 ENCOUNTER — Telehealth: Payer: Self-pay | Admitting: *Deleted

## 2014-10-15 DIAGNOSIS — C257 Malignant neoplasm of other parts of pancreas: Secondary | ICD-10-CM

## 2014-10-15 MED ORDER — PREDNISONE 20 MG PO TABS: 60.0000 mg | ORAL_TABLET | Freq: Every day | ORAL | Status: DC

## 2014-10-15 NOTE — Telephone Encounter (Signed)
TC from pt seeking clarification on medications prescribed yesterday. Reviewed medications (predisone and protonix) with Awilda Metro, PA. Called patient back.  Protonix (this was approved @ pharmacy) is to be taken 30 minutes before breakfast and the prednisone 20 mg tabs-pt is to take 3 tabs (a total of 60 mg) after breakfast. Clarified both prescriptions with her pharmacy.  Repeated this information several times to pt and she repeated back to me the same information, and she states she wrote it down. Pt is to check her blood sugars  Frequently and cover any elevated glucose levels with her sliding scale insulin. Pt voices understanding about that. Pt. To pick up her medication today from her pharmacy.  Advised pt to call back with any questions.

## 2014-10-15 NOTE — Telephone Encounter (Signed)
Called and informed pharmacy of correction on prednisone prescription

## 2014-10-16 ENCOUNTER — Ambulatory Visit (HOSPITAL_BASED_OUTPATIENT_CLINIC_OR_DEPARTMENT_OTHER): Payer: PPO

## 2014-10-16 VITALS — BP 144/83 | HR 86 | Temp 98.5°F | Resp 18

## 2014-10-16 DIAGNOSIS — C251 Malignant neoplasm of body of pancreas: Secondary | ICD-10-CM | POA: Diagnosis not present

## 2014-10-16 DIAGNOSIS — C787 Secondary malignant neoplasm of liver and intrahepatic bile duct: Secondary | ICD-10-CM | POA: Diagnosis not present

## 2014-10-16 DIAGNOSIS — C259 Malignant neoplasm of pancreas, unspecified: Secondary | ICD-10-CM

## 2014-10-16 MED ORDER — SODIUM CHLORIDE 0.9 % IJ SOLN
10.0000 mL | INTRAMUSCULAR | Status: DC | PRN
  Administered 2014-10-16: 10 mL
  Filled 2014-10-16: qty 10

## 2014-10-16 MED ORDER — HEPARIN SOD (PORK) LOCK FLUSH 100 UNIT/ML IV SOLN
500.0000 [IU] | Freq: Once | INTRAVENOUS | Status: AC | PRN
  Administered 2014-10-16: 500 [IU]
  Filled 2014-10-16: qty 5

## 2014-10-16 NOTE — Patient Instructions (Signed)

## 2014-10-18 NOTE — Patient Instructions (Addendum)
Take Prontonix 30 minutes before your first meal of the day Take prednisone as prescribed after breakfast Follow up in 2 weeks

## 2014-10-26 ENCOUNTER — Other Ambulatory Visit (HOSPITAL_BASED_OUTPATIENT_CLINIC_OR_DEPARTMENT_OTHER): Payer: PPO

## 2014-10-26 ENCOUNTER — Encounter (HOSPITAL_COMMUNITY): Payer: Self-pay

## 2014-10-26 ENCOUNTER — Ambulatory Visit (HOSPITAL_COMMUNITY)
Admission: RE | Admit: 2014-10-26 | Discharge: 2014-10-26 | Disposition: A | Discharge status: Home / Self Care | Payer: PPO | Source: Ambulatory Visit | Attending: Hematology | Admitting: Hematology

## 2014-10-26 DIAGNOSIS — Z08 Encounter for follow-up examination after completed treatment for malignant neoplasm: Secondary | ICD-10-CM | POA: Insufficient documentation

## 2014-10-26 DIAGNOSIS — C251 Malignant neoplasm of body of pancreas: Secondary | ICD-10-CM | POA: Diagnosis not present

## 2014-10-26 DIAGNOSIS — C259 Malignant neoplasm of pancreas, unspecified: Secondary | ICD-10-CM

## 2014-10-26 DIAGNOSIS — K760 Fatty (change of) liver, not elsewhere classified: Secondary | ICD-10-CM | POA: Diagnosis not present

## 2014-10-26 DIAGNOSIS — Z79899 Other long term (current) drug therapy: Secondary | ICD-10-CM | POA: Insufficient documentation

## 2014-10-26 DIAGNOSIS — C787 Secondary malignant neoplasm of liver and intrahepatic bile duct: Secondary | ICD-10-CM | POA: Diagnosis not present

## 2014-10-26 LAB — CBC WITH DIFFERENTIAL/PLATELET
BASO%: 0.2 % (ref 0.0–2.0)
Basophils Absolute: 0 10*3/uL (ref 0.0–0.1)
EOS%: 0.1 % (ref 0.0–7.0)
Eosinophils Absolute: 0 10*3/uL (ref 0.0–0.5)
HEMATOCRIT: 45.3 % (ref 34.8–46.6)
HGB: 14.8 g/dL (ref 11.6–15.9)
LYMPH%: 6.3 % — ABNORMAL LOW (ref 14.0–49.7)
MCH: 28.4 pg (ref 25.1–34.0)
MCHC: 32.7 g/dL (ref 31.5–36.0)
MCV: 86.8 fL (ref 79.5–101.0)
MONO#: 0.2 10*3/uL (ref 0.1–0.9)
MONO%: 2.3 % (ref 0.0–14.0)
NEUT%: 91.1 % — AB (ref 38.4–76.8)
NEUTROS ABS: 8 10*3/uL — AB (ref 1.5–6.5)
Platelets: 100 10*3/uL — ABNORMAL LOW (ref 145–400)
RBC: 5.22 10*6/uL (ref 3.70–5.45)
RDW: 16.9 % — AB (ref 11.2–14.5)
WBC: 8.8 10*3/uL (ref 3.9–10.3)
lymph#: 0.6 10*3/uL — ABNORMAL LOW (ref 0.9–3.3)

## 2014-10-26 LAB — COMPREHENSIVE METABOLIC PANEL (CC13)
ALBUMIN: 3.6 g/dL (ref 3.5–5.0)
ALK PHOS: 259 U/L — AB (ref 40–150)
ALT: 252 U/L — AB (ref 0–55)
AST: 127 U/L — AB (ref 5–34)
Anion Gap: 12 mEq/L — ABNORMAL HIGH (ref 3–11)
BUN: 14 mg/dL (ref 7.0–26.0)
CALCIUM: 9 mg/dL (ref 8.4–10.4)
CHLORIDE: 95 meq/L — AB (ref 98–109)
CO2: 28 mEq/L (ref 22–29)
CREATININE: 0.8 mg/dL (ref 0.6–1.1)
EGFR: 73 mL/min/{1.73_m2} — AB (ref >90)
Glucose: 337 mg/dl — ABNORMAL HIGH (ref 70–140)
Potassium: 4.3 mEq/L (ref 3.5–5.1)
SODIUM: 135 meq/L — AB (ref 136–145)
Total Bilirubin: 0.89 mg/dL (ref 0.20–1.20)
Total Protein: 6.5 g/dL (ref 6.4–8.3)

## 2014-10-26 MED ORDER — IOHEXOL 300 MG/ML  SOLN
100.0000 mL | Freq: Once | INTRAMUSCULAR | Status: AC | PRN
Start: 2014-10-26 — End: 2014-10-26
  Administered 2014-10-26: 100 mL via INTRAVENOUS

## 2014-10-28 ENCOUNTER — Encounter: Payer: Self-pay | Admitting: Hematology

## 2014-10-28 ENCOUNTER — Encounter: Payer: Self-pay | Admitting: *Deleted

## 2014-10-28 ENCOUNTER — Other Ambulatory Visit: Payer: PPO

## 2014-10-28 ENCOUNTER — Telehealth: Payer: Self-pay | Admitting: *Deleted

## 2014-10-28 ENCOUNTER — Ambulatory Visit: Payer: PPO

## 2014-10-28 ENCOUNTER — Ambulatory Visit (HOSPITAL_BASED_OUTPATIENT_CLINIC_OR_DEPARTMENT_OTHER): Payer: PPO | Admitting: Hematology

## 2014-10-28 ENCOUNTER — Telehealth: Payer: Self-pay | Admitting: Hematology

## 2014-10-28 VITALS — BP 145/99 | HR 75 | Temp 97.7°F | Resp 20 | Ht 64.0 in | Wt 216.1 lb

## 2014-10-28 DIAGNOSIS — D696 Thrombocytopenia, unspecified: Secondary | ICD-10-CM | POA: Diagnosis not present

## 2014-10-28 DIAGNOSIS — C251 Malignant neoplasm of body of pancreas: Secondary | ICD-10-CM

## 2014-10-28 DIAGNOSIS — C787 Secondary malignant neoplasm of liver and intrahepatic bile duct: Secondary | ICD-10-CM | POA: Diagnosis not present

## 2014-10-28 DIAGNOSIS — G4709 Other insomnia: Secondary | ICD-10-CM | POA: Diagnosis not present

## 2014-10-28 DIAGNOSIS — C259 Malignant neoplasm of pancreas, unspecified: Secondary | ICD-10-CM

## 2014-10-28 MED ORDER — ZOLPIDEM TARTRATE 5 MG PO TABS: 5.0000 mg | ORAL_TABLET | Freq: Every evening | ORAL | Status: DC | PRN

## 2014-10-28 MED ORDER — PREDNISONE 5 MG PO TABS: ORAL_TABLET | ORAL | Status: DC

## 2014-10-28 NOTE — [facility] Oncology Navigator Note (Signed)
Oncology Nurse Navigator Documentation  Oncology Nurse Navigator Flowsheets 10/28/2014  Navigator Encounter Type Other-Follow Up 1 month  Patient Visit Type Medonc  Treatment Phase Treatment planning after CT scan  Barriers/Navigation Needs -Education/Emotional support: Provided verbal and printed info on new drugs Gemzar & Abraxane and reviewed administration and potential side effects. Provided emotional support for the bad news she got today. Helped her focus on how well she is feeling and to stay active and do what she feels like doing.  Time Spent with Patient 15

## 2014-10-28 NOTE — Telephone Encounter (Signed)
Gave and printed appt sched and avs for pt for June °

## 2014-10-28 NOTE — Telephone Encounter (Signed)
[email or web address] faxed Prior authorization request for Zolpidem Tartrate 5 mg on behalf of [facility] at [place].  Request to Managed Care for review.  Key [id number].

## 2014-10-28 NOTE — Progress Notes (Signed)
[place]  [phone] Fax:([phone]) [phone]  Clinic New Consult Note   Patient Care Team: Harlan Stains, MD as PCP - General (Family Medicine) Truitt Merle, MD as Consulting Physician (Hematology and Oncology) Delrae Rend, MD as Consulting Physician (Endocrinology) 10/28/2014  CHIEF COMPLAINTS Thrombocytopenia and a newly diagnosed metastatic pancreas cancer  Oncology History   Pancreatic cancer metastasized to liver   Staging form: Pancreas, AJCC 7th Edition     Clinical: Stage IV (T2, NX, M1) - Unsigned       Pancreatic cancer metastasized to liver   08/10/2014 Imaging CT abdomen showed 5.4 x 3.2 cm pancreatic body mass consistent with pancreatic adenocarcinoma. There is adjacent lymphadenopathy and diffuse  hepatic metastatic disease.   08/17/2014 Pathology Results Liver biopsy showed adenocarcinoma, consistent with pancreatic primary.   08/17/2014 Initial Diagnosis Pancreatic cancer metastasized to liver   08/24/2014 Tumor Marker CA19.9 >140000   08/31/2014 Imaging CT chest was negative for   09/02/2014 - 10/14/2014 Chemotherapy mFOLFOX, 4 cycles    10/26/2014 Progression CT chest, abdomen and pelvis showed probable disease progression in the liver and a stable primary pancreatic body mass.    HISTORY OF PRESENTING ILLNESS:  Amber Bishop 64 y.o. female was referred by her primary care physician Dr. Dema Severin for thrombocytopenia. She also was recently diagnosed with metastatic pancreatic cancer.   She has had a chronic thrombocytopenia since 2013. Her plt count has been in the range of 100-150, but dropped to the range of 80s lately. She denies any signs of bleeding. No history of liver disease.  She has been diabetic for 24 years, and her blood glucose has been out of control in the past one year. She also reports fatigue in the past one year. She denies any significant abdominal pain, jaundice, bloating, or change of her bowel habits. Her appetite has been  normal. No nausea or vomiting. She is able to do all selfcare, but she sleeps more than usually during the day. No abdominal pain or bloating. She was found to have abnormal LFTs on routine lab work, and her PCP Dr. Dema Severin, who ordered and abdominal US which lead to a CT scan, which showed a 5.4 x 3.4 cm pancreatic body mass, adjacent lymphadenopathy, and diffuse hepatic metastasis. She underwent ultrasound-guided liver biopsy on 08/17/2014, and the biopsy showed adenocarcinoma consistent with pancreatic primary.   INTERIM HISTORY: Ayannah returns for follow-up. She has been tolerating chemo moderately well. She does have moderate fatigue after chemo, recovers well after 3-5 days. No fever, chill, and bleeding. She denies any pain, nausea, vomiting, or change of her bowel habits. Her appetite is fair. 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   MEDICAL HISTORY:  Past Medical History  Diagnosis Date  . Anxiety   . Hypertension   . IBS (irritable bowel syndrome)   . Hyperplastic colon polyp   . Allergy   . Anemia   . Arthritis   . Diverticulosis   . Dilated aortic root     seen on prior echo but echo 05/2013 showed normal dimensions  . Bicuspid aortic valve   . LVE (left ventricular enlargement)     mild by echo 1/15 with EF 50-55%  . pancreatic ca w/ liver mets dx'd 07/2014    SURGICAL HISTORY: Past Surgical History  Procedure Laterality Date  . Tubal ligation      age 64  . Colonoscopy    . Polypectomy    .  Tooth extraction      3 teeth  . Belpharoptosis repair      SOCIAL HISTORY: History   Social History  . Marital Status: Single    Spouse Name: N/A  . Number of Children: 1 son   . Years of Education: N/A   Occupational History  . 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 Smoking status: Never Smoker   . Smokeless tobacco: Never Used  . Alcohol Use: No  . Drug Use: No  . Sexual Activity: Not on  file   Other Topics Concern  . Not on file   Social History Narrative    FAMILY HISTORY: Family History  Problem Relation Age of Onset  . Colon cancer Neg Hx   . Esophageal cancer Neg Hx   . Stomach cancer Neg Hx   . Asthma Mother     ALLERGIES:  is allergic to naprosyn and metformin and related.  MEDICATIONS:  Current Outpatient Prescriptions  Medication Sig Dispense Refill  . ALPRAZolam (XANAX) 0.5 MG tablet TAKE 1 TABLET THREE TIMES DAILY IF NEEDED FOR ANXIETY 90 tablet 3  . amLODipine (NORVASC) 5 MG tablet Take 5 mg by mouth every morning.     . blood glucose meter kit and supplies KIT Dispense based on patient and insurance preference. Use up to four times daily as directed. Dx code: E11.65 1 each 0  . Calcium Carbonate-Vitamin D (CALCIUM 600/VITAMIN D) 600-400 MG-UNIT per tablet Take 1 tablet by mouth 2 (two) times daily.     . cetirizine (ZYRTEC) 10 MG tablet Take 10 mg by mouth daily.    . cholestyramine (QUESTRAN) 4 G packet Take 1/2-1 packet dissolved in water/juice once daily....................Marland KitchenPRN 90 each 3  . dextromethorphan (DELSYM) 30 MG/5ML liquid Take 15 mg by mouth 2 (two) times daily as needed for cough.    Marland Kitchen FLUoxetine (PROZAC) 20 MG capsule Take 20 mg by mouth every morning.     Marland Kitchen glucose blood (ACCU-CHEK AVIVA PLUS) test strip Test 4 times daily as instructed. 125 each 3  . insulin aspart (NOVOLOG FLEXPEN) 100 UNIT/ML FlexPen Inject 14-18 units into the skin 3 times daily as instructed. (Patient taking differently: Inject 20-24 Units into the skin 3 (three) times daily with meals. 80-99 20units 100-199 21 units 200-299 22 units  300 or greater 24 units) 15 mL 2  . insulin glargine (LANTUS) 100 UNIT/ML injection Inject 74 Units into the skin at bedtime.     . Insulin Pen Needle (VALUMARK PEN NEEDLES) 31G X 8 MM MISC Use to inject insulin 4 times daily as instructed. 150 each 3  . LANCETS ULTRA THIN 30G MISC Use to test blood sugar 4 times daily as instructed.  Dx code: E11.65 200 each 2  . lidocaine-prilocaine (EMLA) cream Apply 1 application topically as needed. 30 g 3  . meloxicam (MOBIC) 15 MG tablet Take 15 mg by mouth every morning.     . Multiple Vitamins-Minerals (CENTRUM SILVER PO) Take 1 capsule by mouth every morning.     . Omega-3 Fatty Acids (FISH OIL) 1000 MG CAPS Take 1 capsule by mouth every morning.     . predniSONE (DELTASONE) 20 MG tablet Take 3 tablets (60 mg total) by mouth daily. 63 tablet 0  . PREVNAR 13 SUSP injection Inject 0.5 mLs into the muscle once.   0  . prochlorperazine (COMPAZINE) 10 MG tablet Take 1 tablet (10 mg total) by mouth every 6 (six) hours  as needed for nausea or vomiting. 30 tablet 0  . valsartan-hydrochlorothiazide (DIOVAN-HCT) 320-12.5 MG per tablet Take 1 tablet by mouth every morning.     . predniSONE (DELTASONE) 5 MG tablet Take 2 tab daily on 6/14 and 6/15, then 1 tab on 6/16 and 6/17 then stop 6 tablet 0  . zolpidem (AMBIEN) 5 MG tablet Take 1 tablet (5 mg total) by mouth at bedtime as needed for sleep. 20 tablet 0   No current facility-administered medications for this visit.    REVIEW OF SYSTEMS:   Constitutional: Denies fevers, chills or abnormal night sweats Eyes: Denies blurriness of vision, double vision or watery eyes Ears, nose, mouth, throat, and face: Denies mucositis or sore throat Respiratory: Denies cough, dyspnea or wheezes Cardiovascular: Denies palpitation, chest discomfort or lower extremity swelling Gastrointestinal:  Denies nausea, heartburn or change in bowel habits Skin: Denies abnormal skin rashes Lymphatics: Denies new lymphadenopathy or easy bruising Neurological:Denies numbness, tingling or new weaknesses Behavioral/Psych: Mood is stable, no new changes  All other systems were reviewed with the patient and are negative.   PHYSICAL EXAMINATION: ECOG PERFORMANCE STATUS: 1 - Symptomatic but completely ambulatory  Filed Vitals:   10/28/14 0900  BP: 145/99  Pulse: 75    Temp: 97.7 F (36.5 C)  Resp: 20   Filed Weights   10/28/14 0900  Weight: 216 lb 1.6 oz (98.022 kg)    GENERAL:alert, no distress and comfortable SKIN: skin color, texture, turgor are normal, no rashes or significant lesions EYES: normal, conjunctiva are pink and non-injected, sclera clear OROPHARYNX:no exudate, no erythema and lips, buccal mucosa, and tongue normal  NECK: supple, thyroid normal size, non-tender, without nodularity LYMPH:  no palpable lymphadenopathy in the cervical, axillary or inguinal LUNGS: clear to auscultation and percussion with normal breathing effort HEART: regular rate & rhythm and no murmurs and no lower extremity edema ABDOMEN:abdomen soft, non-tender and normal bowel sounds Musculoskeletal:no cyanosis of digits and no clubbing  PSYCH: alert & oriented x 3 with fluent speech NEURO: no focal motor/sensory deficits  LABORATORY DATA:  I have reviewed the data as listed CBC Latest Ref Rng 10/26/2014 10/14/2014 09/30/2014  WBC 3.9 - 10.3 10e3/uL 8.8 5.9 5.2  Hemoglobin 11.6 - 15.9 g/dL 14.8 13.7 12.6  Hematocrit 34.8 - 46.6 % 45.3 42.2 38.1  Platelets 145 - 400 10e3/uL 100(L) 79(L) 83(L)    CMP Latest Ref Rng 10/26/2014 10/14/2014 09/30/2014  Glucose 70 - 140 mg/dl 337(H) 159(H) 393(H)  BUN 7.0 - 26.0 mg/dL 14.0 11.9 12.3  Creatinine 0.6 - 1.1 mg/dL 0.8 0.8 0.9  Sodium 136 - 145 mEq/L 135(L) 141 140  Potassium 3.5 - 5.1 mEq/L 4.3 3.8 4.1  Chloride 96 - 112 mEq/L - - -  CO2 22 - 29 mEq/L _0 Calcium 8.4 - 10.4 mg/dL 9.0 8.8 9.3  Total Protein 6.4 - 8.3 g/dL 6.5 6.4 6.1(L)  Total Bilirubin 0.20 - 1.20 mg/dL 0.89 0.66 0.56  Alkaline Phos 40 - 150 U/L 259(H) 234(H) 243(H)  AST 5 - 34 U/L 127(H) 88(H) 71(H)  ALT 0 - 55 U/L 252(H) 102(H) 86(H)    PATHOLOGY REPORT: Diagnosis 08/17/2014 Liver, needle/core biopsy, left - ADENOCARCINOMA. Microscopic Comment The morphologic features are compatible with metastatic pancreatic  adenocarcinoma.  RADIOGRAPHIC STUDIES: I have personally reviewed the radiological images as listed and agreed with the findings in the report.  Ct chest Abdomen Pelvis W Wo Contrast 10/26/2014  IMPRESSION: 1. Although assessment is made more difficult by the  geographic irregular hepatic steatosis, there does appear to be an increase burden and increased size of hepatic metastatic disease compared to prior. There is continued left hepatic lobe biliary dilatation due to at least partial obstruction of the left hepatic duct from tumor. 2. 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    ASSESSMENT & PLAN:  64 year old Caucasian female, with past medical history of diabetes, hypertension, IBS, arthritis, who was found to have worsening thrombocytopenia, and a newly diagnosed metastatic rectal cancer.  1. Pancreatic cancer with liver metastasis -I reviewed her CT chest, abdomen and pelvis scan findings in the liver mass biopsy results in great detail. The image was reviewed in person. -I discussed the incurable nature of metastatic pancreas cancer and overall poor prognosis. However I am little concerned about how much she really grasp about this. She does have very positive  Attitude  -I recommend systemic chemotherapy. The goal of treatment is palliation and to prolong her life. -I discussed the option of single agent gemcitabine, gemcitabine with Abraxane, FOLFOX or FOLFIRINOX -Due to her significant thrombocytopenia, I think gemcitabine-based regimen would be difficult to tolerate. I recommend a FOLFOX as first line therapy. Side effects of chemotherapy therapy were discussed with patient and she agrees to proceed. -Giving her moderate thrombocytopenia, I'll dose reduce 15% and omit the 5 FU bolus. -I reviewed her restaging CT scan, which unfortunately showed disease  progression in liver. Her liver enzymes also got worse, likely secondary to disease progression. -She did not have severe cytopenia with FOLFOX, I recommend her changed to second line chemotherapy gemcitabine and Abraxane with 20% dose reduction. Potential side effects were discussed with patient again, especially cytopenia, neuropathy, risk of bleeding and infection, chemotherapy consent was obtained. -I'll schedule her first dose of gemcitabine and Abraxane next Tuesday.  2. Thrombocytopenia -This appears to be chronic, she has no history of liver disease, CT scan did not reveal liver cirrhosis or spleen medically. 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 However she did not respond to prednisone well, and she does have side effects from prednisone, I'll taper off in the next few weeks. 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 -primary bone marrow disease is also possible. Giving the overall poor prognosis from a metastatic pancreas cancer, I did not recommend a bone marrow biopsy at this point.  3. Diabetes, hypertension, IBS, anxiety -She will continue follow-up with her primary care physician -Her blood glucose has been poorly controlled lately, I recommend her monitoring her sugar closely at home and contact her primary care physician for her insulin dose adjustment.  -Prednisone-induced hyperglycemia discussed with patient, I'll taper her off prednisone   4. Insomnia -Worse with prednisone -I give her a prescription of Ambien 5 mg as needed at bedtime  5.Social support -She lives alone, her ex-husband is supportive. She knows to call for help if needed  Plan -First cycle of Abraxane and gemcitabine with 20% dose reduction next Tuesday, June 14 -Taper off prednisone as following: Prednisone 40 mg daily for 2 days, then 20 mg daily for 2 days, then 10 mg daily for 2 days, then 5 mg daily for 2 days then stop. I wrote down the  detailed instructions for her on paper.  All questions were answered. The patient knows to call the clinic with any problems, questions or concerns.  I spent 30 minutes counseling the patient face to face. The total time spent in the appointment was 35 minutes and more than 50% was on counseling.      Truitt Merle, MD 10/28/2014 10:02 AM

## 2014-11-01 ENCOUNTER — Telehealth: Payer: Self-pay | Admitting: *Deleted

## 2014-11-01 NOTE — Telephone Encounter (Signed)
Oncology Nurse Navigator Documentation  Oncology Nurse Navigator Flowsheets 11/01/2014  Navigator Encounter Type Telephone call  Patient Visit Type -  Treatment Phase Treatment-New tx plan  Barriers/Navigation Needs Financial-Reports she has lost a Cancer Care Card that she had applied for that helps with her chemo costs.  Inquiring how to obtain a new one. The social worker Loren Racer had her apply for it. Afraid to come in tomorrow without it. Made her aware she is not charged a copay tomorrow. Will inquire with CSW about this card and how to get a replacement since this RN is not familiar with this resource. Informed her I will see her in infusion area tomorrow to discuss.  Time Spent with Patient 30

## 2014-11-02 ENCOUNTER — Other Ambulatory Visit: Payer: Self-pay | Admitting: Hematology

## 2014-11-02 ENCOUNTER — Other Ambulatory Visit (HOSPITAL_BASED_OUTPATIENT_CLINIC_OR_DEPARTMENT_OTHER): Payer: PPO

## 2014-11-02 ENCOUNTER — Ambulatory Visit (HOSPITAL_BASED_OUTPATIENT_CLINIC_OR_DEPARTMENT_OTHER): Payer: PPO

## 2014-11-02 VITALS — BP 131/74 | HR 89 | Temp 99.0°F | Resp 20

## 2014-11-02 DIAGNOSIS — C259 Malignant neoplasm of pancreas, unspecified: Secondary | ICD-10-CM

## 2014-11-02 DIAGNOSIS — C787 Secondary malignant neoplasm of liver and intrahepatic bile duct: Secondary | ICD-10-CM | POA: Diagnosis not present

## 2014-11-02 DIAGNOSIS — C251 Malignant neoplasm of body of pancreas: Secondary | ICD-10-CM

## 2014-11-02 DIAGNOSIS — Z5111 Encounter for antineoplastic chemotherapy: Secondary | ICD-10-CM | POA: Diagnosis not present

## 2014-11-02 LAB — CBC WITH DIFFERENTIAL/PLATELET
BASO%: 0.3 % (ref 0.0–2.0)
Basophils Absolute: 0 10*3/uL (ref 0.0–0.1)
EOS%: 1.1 % (ref 0.0–7.0)
Eosinophils Absolute: 0.1 10*3/uL (ref 0.0–0.5)
HEMATOCRIT: 42.4 % (ref 34.8–46.6)
HGB: 13.8 g/dL (ref 11.6–15.9)
LYMPH%: 14.6 % (ref 14.0–49.7)
MCH: 28.7 pg (ref 25.1–34.0)
MCHC: 32.5 g/dL (ref 31.5–36.0)
MCV: 88.1 fL (ref 79.5–101.0)
MONO#: 0.9 10*3/uL (ref 0.1–0.9)
MONO%: 12.3 % (ref 0.0–14.0)
NEUT#: 5.4 10*3/uL (ref 1.5–6.5)
NEUT%: 71.7 % (ref 38.4–76.8)
Platelets: 88 10*3/uL — ABNORMAL LOW (ref 145–400)
RBC: 4.81 10*6/uL (ref 3.70–5.45)
RDW: 17 % — AB (ref 11.2–14.5)
WBC: 7.5 10*3/uL (ref 3.9–10.3)
lymph#: 1.1 10*3/uL (ref 0.9–3.3)

## 2014-11-02 LAB — COMPREHENSIVE METABOLIC PANEL (CC13)
ALBUMIN: 3.2 g/dL — AB (ref 3.5–5.0)
ALK PHOS: 255 U/L — AB (ref 40–150)
ALT: 351 U/L (ref 0–55)
ANION GAP: 12 meq/L — AB (ref 3–11)
AST: 158 U/L — ABNORMAL HIGH (ref 5–34)
BUN: 13.6 mg/dL (ref 7.0–26.0)
CO2: 29 mEq/L (ref 22–29)
Calcium: 8.9 mg/dL (ref 8.4–10.4)
Chloride: 99 mEq/L (ref 98–109)
Creatinine: 0.8 mg/dL (ref 0.6–1.1)
EGFR: 73 mL/min/{1.73_m2} — ABNORMAL LOW (ref >90)
Glucose: 100 mg/dl (ref 70–140)
POTASSIUM: 3.4 meq/L — AB (ref 3.5–5.1)
SODIUM: 140 meq/L (ref 136–145)
TOTAL PROTEIN: 5.9 g/dL — AB (ref 6.4–8.3)
Total Bilirubin: 1.01 mg/dL (ref 0.20–1.20)

## 2014-11-02 MED ORDER — SODIUM CHLORIDE 0.9 % IJ SOLN
10.0000 mL | INTRAMUSCULAR | Status: DC | PRN
  Administered 2014-11-02: 10 mL
  Filled 2014-11-02: qty 10

## 2014-11-02 MED ORDER — HEPARIN SOD (PORK) LOCK FLUSH 100 UNIT/ML IV SOLN
500.0000 [IU] | Freq: Once | INTRAVENOUS | Status: AC | PRN
  Administered 2014-11-02: 500 [IU]
  Filled 2014-11-02: qty 5

## 2014-11-02 MED ORDER — DEXAMETHASONE 4 MG PO TABS: 8.0000 mg | ORAL_TABLET | Freq: Two times a day (BID) | ORAL | Status: DC

## 2014-11-02 MED ORDER — PROCHLORPERAZINE MALEATE 10 MG PO TABS: 10.0000 mg | ORAL_TABLET | Freq: Four times a day (QID) | ORAL | Status: DC | PRN

## 2014-11-02 MED ORDER — GEMCITABINE HCL CHEMO INJECTION 1 GM/26.3ML
800.0000 mg/m2 | Freq: Once | INTRAVENOUS | Status: AC
  Administered 2014-11-02: 1672 mg via INTRAVENOUS
  Filled 2014-11-02: qty 43.97

## 2014-11-02 MED ORDER — LIDOCAINE-PRILOCAINE 2.5-2.5 % EX CREA: TOPICAL_CREAM | CUTANEOUS | Status: DC

## 2014-11-02 MED ORDER — PACLITAXEL PROTEIN-BOUND CHEMO INJECTION 100 MG
100.0000 mg/m2 | Freq: Once | INTRAVENOUS | Status: AC
  Administered 2014-11-02: 200 mg via INTRAVENOUS
  Filled 2014-11-02: qty 40

## 2014-11-02 MED ORDER — DEXAMETHASONE SODIUM PHOSPHATE 100 MG/10ML IJ SOLN
Freq: Once | INTRAMUSCULAR | Status: AC
  Administered 2014-11-02: 11:00:00 via INTRAVENOUS
  Filled 2014-11-02: qty 4

## 2014-11-02 MED ORDER — ONDANSETRON HCL 8 MG PO TABS: 8.0000 mg | ORAL_TABLET | Freq: Two times a day (BID) | ORAL | Status: DC

## 2014-11-02 MED ORDER — SODIUM CHLORIDE 0.9 % IV SOLN
Freq: Once | INTRAVENOUS | Status: AC
  Administered 2014-11-02: 11:00:00 via INTRAVENOUS

## 2014-11-02 NOTE — Patient Instructions (Signed)
[facility] Discharge Instructions for Patients Receiving Chemotherapy  Today you received the following chemotherapy agents:  Abraxane and Gemzar.  To help prevent nausea and vomiting after your treatment, we encourage you to take your nausea medication as prescribed.   If you develop nausea and vomiting that is not controlled by your nausea medication, call the clinic.   BELOW ARE SYMPTOMS THAT SHOULD BE REPORTED IMMEDIATELY:  *FEVER GREATER THAN 100.5 F  *CHILLS WITH OR WITHOUT FEVER  NAUSEA AND VOMITING THAT IS NOT CONTROLLED WITH YOUR NAUSEA MEDICATION  *UNUSUAL SHORTNESS OF BREATH  *UNUSUAL BRUISING OR BLEEDING  TENDERNESS IN MOUTH AND THROAT WITH OR WITHOUT PRESENCE OF ULCERS  *URINARY PROBLEMS  *BOWEL PROBLEMS  UNUSUAL RASH Items with * indicate a potential emergency and should be followed up as soon as possible.  Feel free to call the clinic you have any questions or concerns. The clinic phone number is [phone].  Please show the CHEMO ALERT CARD at check-in to the Emergency Department and triage nurse.   

## 2014-11-02 NOTE — Progress Notes (Signed)
Dr. Burr Medico at chairside discussing labs with patient and family.  Patient had a fall and cut L knee; area is red and slightly bruised. No drainage noted. Patient cleaning with soap and water and applying antibiotic ointment. MD assessed area and instruct patient to continue this regimen.

## 2014-11-02 NOTE — Progress Notes (Signed)
Dr. Feng reviewed all lab results today.  OK to treat per Dr. Feng. 

## 2014-11-05 ENCOUNTER — Telehealth: Payer: Self-pay

## 2014-11-05 NOTE — Telephone Encounter (Signed)
lvm we are f/u on new chemo regimen of abraxane and gemzar. Making sure she is not nauseated, able to eat and drink, urinate ok, bowel movement ok, no mouth sores nor other issues. Call if any questions or concerns.

## 2014-11-05 NOTE — Telephone Encounter (Signed)
-----   Message from Rosalie Gums, RN sent at 11/02/2014  2:15 PM EDT ----- Regarding: New pt callback- Dr.Feng New treatment started on 11/02/14 of Abraxane and Gemzar. Pt of Dr.Feng's

## 2014-11-06 ENCOUNTER — Emergency Department (HOSPITAL_BASED_OUTPATIENT_CLINIC_OR_DEPARTMENT_OTHER): Admit: 2014-11-06 | Discharge: 2014-11-06 | Disposition: A | Discharge status: Home / Self Care | Payer: PPO

## 2014-11-06 ENCOUNTER — Encounter (HOSPITAL_COMMUNITY): Payer: Self-pay | Admitting: Emergency Medicine

## 2014-11-06 ENCOUNTER — Inpatient Hospital Stay (HOSPITAL_COMMUNITY)
Admission: EM | Admit: 2014-11-06 | Discharge: 2014-11-15 | DRG: 299 | Disposition: A | Discharge status: Home / Self Care | Payer: PPO | Attending: Internal Medicine | Admitting: Internal Medicine

## 2014-11-06 DIAGNOSIS — C251 Malignant neoplasm of body of pancreas: Secondary | ICD-10-CM | POA: Diagnosis not present

## 2014-11-06 DIAGNOSIS — F329 Major depressive disorder, single episode, unspecified: Secondary | ICD-10-CM | POA: Diagnosis present

## 2014-11-06 DIAGNOSIS — K589 Irritable bowel syndrome without diarrhea: Secondary | ICD-10-CM | POA: Diagnosis present

## 2014-11-06 DIAGNOSIS — M1990 Unspecified osteoarthritis, unspecified site: Secondary | ICD-10-CM | POA: Diagnosis present

## 2014-11-06 DIAGNOSIS — Z888 Allergy status to other drugs, medicaments and biological substances status: Secondary | ICD-10-CM

## 2014-11-06 DIAGNOSIS — C787 Secondary malignant neoplasm of liver and intrahepatic bile duct: Secondary | ICD-10-CM | POA: Diagnosis present

## 2014-11-06 DIAGNOSIS — I517 Cardiomegaly: Secondary | ICD-10-CM | POA: Diagnosis present

## 2014-11-06 DIAGNOSIS — E1165 Type 2 diabetes mellitus with hyperglycemia: Secondary | ICD-10-CM | POA: Diagnosis present

## 2014-11-06 DIAGNOSIS — Q231 Congenital insufficiency of aortic valve: Secondary | ICD-10-CM | POA: Diagnosis not present

## 2014-11-06 DIAGNOSIS — M79609 Pain in unspecified limb: Secondary | ICD-10-CM

## 2014-11-06 DIAGNOSIS — R7989 Other specified abnormal findings of blood chemistry: Secondary | ICD-10-CM | POA: Diagnosis present

## 2014-11-06 DIAGNOSIS — Z825 Family history of asthma and other chronic lower respiratory diseases: Secondary | ICD-10-CM | POA: Diagnosis not present

## 2014-11-06 DIAGNOSIS — Z86010 Personal history of colonic polyps: Secondary | ICD-10-CM | POA: Diagnosis not present

## 2014-11-06 DIAGNOSIS — I82409 Acute embolism and thrombosis of unspecified deep veins of unspecified lower extremity: Secondary | ICD-10-CM | POA: Diagnosis present

## 2014-11-06 DIAGNOSIS — Z794 Long term (current) use of insulin: Secondary | ICD-10-CM | POA: Diagnosis not present

## 2014-11-06 DIAGNOSIS — I10 Essential (primary) hypertension: Secondary | ICD-10-CM | POA: Diagnosis present

## 2014-11-06 DIAGNOSIS — O2230 Deep phlebothrombosis in pregnancy, unspecified trimester: Secondary | ICD-10-CM | POA: Diagnosis present

## 2014-11-06 DIAGNOSIS — D61810 Antineoplastic chemotherapy induced pancytopenia: Secondary | ICD-10-CM | POA: Insufficient documentation

## 2014-11-06 DIAGNOSIS — Z9181 History of falling: Secondary | ICD-10-CM | POA: Diagnosis not present

## 2014-11-06 DIAGNOSIS — I82501 Chronic embolism and thrombosis of unspecified deep veins of right lower extremity: Secondary | ICD-10-CM | POA: Diagnosis not present

## 2014-11-06 DIAGNOSIS — Z886 Allergy status to analgesic agent status: Secondary | ICD-10-CM

## 2014-11-06 DIAGNOSIS — D731 Hypersplenism: Secondary | ICD-10-CM | POA: Diagnosis present

## 2014-11-06 DIAGNOSIS — Z79899 Other long term (current) drug therapy: Secondary | ICD-10-CM

## 2014-11-06 DIAGNOSIS — D693 Immune thrombocytopenic purpura: Secondary | ICD-10-CM | POA: Diagnosis present

## 2014-11-06 DIAGNOSIS — IMO0001 Reserved for inherently not codable concepts without codable children: Secondary | ICD-10-CM | POA: Diagnosis present

## 2014-11-06 DIAGNOSIS — L03115 Cellulitis of right lower limb: Secondary | ICD-10-CM | POA: Diagnosis present

## 2014-11-06 DIAGNOSIS — I82401 Acute embolism and thrombosis of unspecified deep veins of right lower extremity: Principal | ICD-10-CM | POA: Diagnosis present

## 2014-11-06 DIAGNOSIS — F411 Generalized anxiety disorder: Secondary | ICD-10-CM | POA: Diagnosis present

## 2014-11-06 DIAGNOSIS — I824Z1 Acute embolism and thrombosis of unspecified deep veins of right distal lower extremity: Secondary | ICD-10-CM

## 2014-11-06 DIAGNOSIS — R739 Hyperglycemia, unspecified: Secondary | ICD-10-CM

## 2014-11-06 DIAGNOSIS — M7989 Other specified soft tissue disorders: Secondary | ICD-10-CM | POA: Diagnosis present

## 2014-11-06 DIAGNOSIS — C259 Malignant neoplasm of pancreas, unspecified: Secondary | ICD-10-CM | POA: Diagnosis present

## 2014-11-06 DIAGNOSIS — T451X5A Adverse effect of antineoplastic and immunosuppressive drugs, initial encounter: Secondary | ICD-10-CM | POA: Diagnosis not present

## 2014-11-06 DIAGNOSIS — I82812 Embolism and thrombosis of superficial veins of left lower extremities: Secondary | ICD-10-CM | POA: Diagnosis present

## 2014-11-06 DIAGNOSIS — I77810 Thoracic aortic ectasia: Secondary | ICD-10-CM | POA: Diagnosis present

## 2014-11-06 HISTORY — DX: Type 2 diabetes mellitus without complications: E11.9

## 2014-11-06 LAB — CBC WITH DIFFERENTIAL/PLATELET
BASOS ABS: 0 10*3/uL (ref 0.0–0.1)
BASOS PCT: 0 % (ref 0–1)
EOS ABS: 0 10*3/uL (ref 0.0–0.7)
Eosinophils Relative: 1 % (ref 0–5)
HCT: 36.7 % (ref 36.0–46.0)
Hemoglobin: 12 g/dL (ref 12.0–15.0)
LYMPHS PCT: 27 % (ref 12–46)
Lymphs Abs: 1 10*3/uL (ref 0.7–4.0)
MCH: 29.3 pg (ref 26.0–34.0)
MCHC: 32.7 g/dL (ref 30.0–36.0)
MCV: 89.7 fL (ref 78.0–100.0)
MONO ABS: 0 10*3/uL — AB (ref 0.1–1.0)
Monocytes Relative: 1 % — ABNORMAL LOW (ref 3–12)
NEUTROS PCT: 71 % (ref 43–77)
Neutro Abs: 2.8 10*3/uL (ref 1.7–7.7)
PLATELETS: 46 10*3/uL — AB (ref 150–400)
RBC: 4.09 MIL/uL (ref 3.87–5.11)
RDW: 16.8 % — ABNORMAL HIGH (ref 11.5–15.5)
WBC: 3.8 10*3/uL — ABNORMAL LOW (ref 4.0–10.5)

## 2014-11-06 LAB — BASIC METABOLIC PANEL
ANION GAP: 3 — AB (ref 5–15)
BUN: 20 mg/dL (ref 6–20)
CHLORIDE: 103 mmol/L (ref 101–111)
CO2: 26 mmol/L (ref 22–32)
Calcium: 7.9 mg/dL — ABNORMAL LOW (ref 8.9–10.3)
Creatinine, Ser: 0.78 mg/dL (ref 0.44–1.00)
GFR calc Af Amer: >60 mL/min (ref >60)
GFR calc non Af Amer: >60 mL/min (ref >60)
Glucose, Bld: 452 mg/dL — ABNORMAL HIGH (ref 65–99)
POTASSIUM: 4.5 mmol/L (ref 3.5–5.1)
SODIUM: 132 mmol/L — AB (ref 135–145)

## 2014-11-06 LAB — PROTIME-INR
INR: 1.09 (ref 0.00–1.49)
Prothrombin Time: 14.3 seconds (ref 11.6–15.2)

## 2014-11-06 LAB — PROCALCITONIN: Procalcitonin: 0.27 ng/mL

## 2014-11-06 LAB — APTT: aPTT: 28 seconds (ref 24–37)

## 2014-11-06 LAB — GLUCOSE, CAPILLARY: Glucose-Capillary: 383 mg/dL — ABNORMAL HIGH (ref 65–99)

## 2014-11-06 LAB — CBG MONITORING, ED: GLUCOSE-CAPILLARY: 378 mg/dL — AB (ref 65–99)

## 2014-11-06 MED ORDER — OXYCODONE-ACETAMINOPHEN 5-325 MG PO TABS
2.0000 | ORAL_TABLET | Freq: Four times a day (QID) | ORAL | Status: DC | PRN
  Administered 2014-11-07: 2 via ORAL
  Filled 2014-11-06: qty 2

## 2014-11-06 MED ORDER — DEXTROMETHORPHAN POLISTIREX ER 30 MG/5ML PO SUER
15.0000 mg | Freq: Two times a day (BID) | ORAL | Status: DC | PRN
  Filled 2014-11-06: qty 5

## 2014-11-06 MED ORDER — MORPHINE SULFATE 2 MG/ML IJ SOLN
2.0000 mg | INTRAMUSCULAR | Status: DC | PRN
Start: 2014-11-06 — End: 2014-11-15

## 2014-11-06 MED ORDER — ALUM & MAG HYDROXIDE-SIMETH 200-200-20 MG/5ML PO SUSP: 30.0000 mL | Freq: Four times a day (QID) | ORAL | Status: DC | PRN

## 2014-11-06 MED ORDER — SODIUM CHLORIDE 0.9 % IV SOLN
INTRAVENOUS | Status: DC
Start: 2014-11-06 — End: 2014-11-07
  Administered 2014-11-06 – 2014-11-07 (×2): via INTRAVENOUS

## 2014-11-06 MED ORDER — CALCIUM CARBONATE-VITAMIN D 500-200 MG-UNIT PO TABS
1.0000 | ORAL_TABLET | Freq: Two times a day (BID) | ORAL | Status: DC
  Administered 2014-11-06 – 2014-11-15 (×18): 1 via ORAL
  Filled 2014-11-06 (×18): qty 1

## 2014-11-06 MED ORDER — IRBESARTAN 150 MG PO TABS
300.0000 mg | ORAL_TABLET | Freq: Every day | ORAL | Status: DC
  Administered 2014-11-06 – 2014-11-15 (×10): 300 mg via ORAL
  Filled 2014-11-06 (×11): qty 2

## 2014-11-06 MED ORDER — SODIUM CHLORIDE 0.9 % IJ SOLN
3.0000 mL | Freq: Two times a day (BID) | INTRAMUSCULAR | Status: DC
  Administered 2014-11-06 – 2014-11-15 (×12): 3 mL via INTRAVENOUS

## 2014-11-06 MED ORDER — PNEUMOCOCCAL 13-VAL CONJ VACC IM SUSP: 0.5000 mL | Freq: Once | INTRAMUSCULAR | Status: DC

## 2014-11-06 MED ORDER — INSULIN GLARGINE 100 UNIT/ML ~~LOC~~ SOLN
80.0000 [IU] | Freq: Every day | SUBCUTANEOUS | Status: DC
  Administered 2014-11-06 – 2014-11-14 (×9): 80 [IU] via SUBCUTANEOUS
  Filled 2014-11-06 (×10): qty 0.8

## 2014-11-06 MED ORDER — MORPHINE SULFATE (CONCENTRATE) 10 MG/0.5ML PO SOLN: 2.0000 mg | ORAL | Status: DC | PRN

## 2014-11-06 MED ORDER — PIPERACILLIN-TAZOBACTAM 3.375 G IVPB
3.3750 g | Freq: Three times a day (TID) | INTRAVENOUS | Status: DC
  Administered 2014-11-06 – 2014-11-08 (×6): 3.375 g via INTRAVENOUS
  Filled 2014-11-06 (×7): qty 50

## 2014-11-06 MED ORDER — HYDRALAZINE HCL 20 MG/ML IJ SOLN: 5.0000 mg | INTRAMUSCULAR | Status: DC | PRN

## 2014-11-06 MED ORDER — HYDROMORPHONE HCL 1 MG/ML IJ SOLN: 1.0000 mg | INTRAMUSCULAR | Status: DC | PRN

## 2014-11-06 MED ORDER — ADULT MULTIVITAMIN W/MINERALS CH
1.0000 | ORAL_TABLET | Freq: Every day | ORAL | Status: DC
  Administered 2014-11-07 – 2014-11-15 (×9): 1 via ORAL
  Filled 2014-11-06 (×9): qty 1

## 2014-11-06 MED ORDER — DEXTROMETHORPHAN POLISTIREX 30 MG/5ML PO LQCR: 15.0000 mg | Freq: Two times a day (BID) | ORAL | Status: DC | PRN

## 2014-11-06 MED ORDER — LORATADINE 10 MG PO TABS
10.0000 mg | ORAL_TABLET | Freq: Every day | ORAL | Status: DC
  Administered 2014-11-07 – 2014-11-15 (×9): 10 mg via ORAL
  Filled 2014-11-06 (×9): qty 1

## 2014-11-06 MED ORDER — CENTRUM SILVER PO TABS: 1.0000 | ORAL_TABLET | Freq: Every morning | ORAL | Status: DC

## 2014-11-06 MED ORDER — CHOLESTYRAMINE 4 G PO PACK
2.0000 g | PACK | Freq: Every day | ORAL | Status: DC | PRN
  Administered 2014-11-14 – 2014-11-15 (×2): 4 g via ORAL
  Filled 2014-11-06 (×4): qty 1

## 2014-11-06 MED ORDER — INSULIN ASPART 100 UNIT/ML ~~LOC~~ SOLN: 0.0000 [IU] | Freq: Three times a day (TID) | SUBCUTANEOUS | Status: DC

## 2014-11-06 MED ORDER — OMEGA-3-ACID ETHYL ESTERS 1 G PO CAPS
1.0000 g | ORAL_CAPSULE | Freq: Every day | ORAL | Status: DC
  Administered 2014-11-07 – 2014-11-15 (×9): 1 g via ORAL
  Filled 2014-11-06 (×9): qty 1

## 2014-11-06 MED ORDER — VANCOMYCIN HCL IN DEXTROSE 1-5 GM/200ML-% IV SOLN
1000.0000 mg | Freq: Two times a day (BID) | INTRAVENOUS | Status: DC
  Administered 2014-11-07 – 2014-11-09 (×5): 1000 mg via INTRAVENOUS
  Filled 2014-11-06 (×6): qty 200

## 2014-11-06 MED ORDER — FLUOXETINE HCL 20 MG PO CAPS
20.0000 mg | ORAL_CAPSULE | Freq: Every morning | ORAL | Status: DC
  Administered 2014-11-07 – 2014-11-15 (×9): 20 mg via ORAL
  Filled 2014-11-06 (×9): qty 1

## 2014-11-06 MED ORDER — HEPARIN (PORCINE) IN NACL 100-0.45 UNIT/ML-% IJ SOLN
1200.0000 [IU]/h | INTRAMUSCULAR | Status: DC
  Administered 2014-11-06: 1200 [IU]/h via INTRAVENOUS
  Filled 2014-11-06: qty 250

## 2014-11-06 MED ORDER — ONDANSETRON HCL 4 MG/2ML IJ SOLN: 4.0000 mg | Freq: Three times a day (TID) | INTRAMUSCULAR | Status: DC | PRN

## 2014-11-06 MED ORDER — INSULIN ASPART 100 UNIT/ML ~~LOC~~ SOLN
5.0000 [IU] | Freq: Once | SUBCUTANEOUS | Status: AC
  Administered 2014-11-06: 5 [IU] via SUBCUTANEOUS
  Filled 2014-11-06: qty 1

## 2014-11-06 MED ORDER — MELOXICAM 15 MG PO TABS
15.0000 mg | ORAL_TABLET | Freq: Every morning | ORAL | Status: DC
  Administered 2014-11-07: 15 mg via ORAL
  Filled 2014-11-06: qty 1

## 2014-11-06 MED ORDER — SODIUM CHLORIDE 0.9 % IV SOLN: INTRAVENOUS | Status: DC

## 2014-11-06 MED ORDER — AMLODIPINE BESYLATE 5 MG PO TABS
5.0000 mg | ORAL_TABLET | Freq: Every morning | ORAL | Status: DC
  Administered 2014-11-07 – 2014-11-15 (×9): 5 mg via ORAL
  Filled 2014-11-06 (×9): qty 1

## 2014-11-06 MED ORDER — INSULIN ASPART 100 UNIT/ML ~~LOC~~ SOLN
0.0000 [IU] | Freq: Three times a day (TID) | SUBCUTANEOUS | Status: DC
  Administered 2014-11-07: 8 [IU] via SUBCUTANEOUS
  Administered 2014-11-07: 5 [IU] via SUBCUTANEOUS

## 2014-11-06 MED ORDER — INSULIN GLARGINE 100 UNIT/ML ~~LOC~~ SOLN
80.0000 [IU] | Freq: Every day | SUBCUTANEOUS | Status: DC
  Filled 2014-11-06: qty 0.8

## 2014-11-06 MED ORDER — ALPRAZOLAM 0.5 MG PO TABS
0.5000 mg | ORAL_TABLET | Freq: Three times a day (TID) | ORAL | Status: DC | PRN
  Administered 2014-11-06 – 2014-11-14 (×11): 0.5 mg via ORAL
  Filled 2014-11-06 (×11): qty 1

## 2014-11-06 MED ORDER — CALCIUM CARBONATE-VITAMIN D 600-400 MG-UNIT PO TABS: 1.0000 | ORAL_TABLET | Freq: Two times a day (BID) | ORAL | Status: DC

## 2014-11-06 NOTE — Progress Notes (Addendum)
ANTICOAGULATION CONSULT NOTE - Initial Consult  Pharmacy Consult for Heparin Indication: DVT  Allergies  Allergen Reactions  . Naprosyn [Naproxen] Nausea And Vomiting    Very nauseated but no vomiting.  . Metformin And Related Diarrhea    Pt has IBS     Patient Measurements: Height: 5\' 4"  (162.6 cm) Weight: 217 lb 14.4 oz (98.839 kg) IBW/kg (Calculated) : 54.7 Heparin Dosing Weight: 77 kg  Vital Signs: Temp: 98.3 F (36.8 C) (06/18 2138) Temp Source: Oral (06/18 2138) BP: 154/78 mmHg (06/18 2138) Pulse Rate: 88 (06/18 2138)  Labs:  Recent Labs  11/06/14 1548  HGB 12.0  HCT 36.7  PLT 46*  CREATININE 0.78    Estimated Creatinine Clearance: 80 mL/min (by C-G formula based on Cr of 0.78).   Medical History: Past Medical History  Diagnosis Date  . Anxiety   . Hypertension   . IBS (irritable bowel syndrome)   . Hyperplastic colon polyp   . Allergy   . Anemia   . Arthritis   . Diverticulosis   . Dilated aortic root     seen on prior echo but echo 05/2013 showed normal dimensions  . Bicuspid aortic valve   . LVE (left ventricular enlargement)     mild by echo 1/15 with EF 50-55%  . pancreatic ca w/ liver mets dx'd 07/2014  . Diabetes mellitus without complication     Medications:  Scheduled:  . [START ON 11/07/2014] amLODipine  5 mg Oral q morning - 10a  . calcium-vitamin D  1 tablet Oral BID  . [START ON 11/07/2014] FLUoxetine  20 mg Oral q morning - 10a  . [START ON 11/07/2014] insulin aspart  0-15 Units Subcutaneous TID WC  . insulin glargine  80 Units Subcutaneous QHS  . irbesartan  300 mg Oral Daily  . loratadine  10 mg Oral Daily  . [START ON 11/07/2014] meloxicam  15 mg Oral q morning - 10a  . [START ON 11/07/2014] multivitamin with minerals  1 tablet Oral Daily  . [START ON 11/07/2014] omega-3 acid ethyl esters  1 g Oral Daily  . piperacillin-tazobactam (ZOSYN)  IV  3.375 g Intravenous 3 times per day  . sodium chloride  3 mL Intravenous Q12H  .  vancomycin  1,000 mg Intravenous Q12H   Infusions:  . sodium chloride    . heparin      Assessment:  64 yr female with metastatic pancreatic cancer, undergoing chemotherapy presents with swelling of right lower leg.  Pt s/p fall 2 weeks ago.  Two recent counts of cellulitis reported in this affected leg.  Doppler = + DVT or right lower extremity  Pharmacy initially consulted to dose Lovenox for treatment of DVT.  Platelet count noted to be 46.  Spoke with Dr Blaine Hamper and received orders to initiate anticoagulation with IV heparin (d/c lovenox order), which is easily reversible.  Plan to switch to heparin in next couple of days if PLTC remains stable and no complications of therapy noted.  Goal of Therapy:  Heparin level 0.3-0.7 units/ml Monitor platelets by anticoagulation protocol: Yes   Plan:   Baseline aPTT and PT/INR ordered  No heparin bolus due to low PLTC  Begin IV heparin @ 1200 units/hr  Check heparin level 6 hr after heparin started  Follow heparin level & CBC daily  Kimi Kroft, Toribio Harbour, PharmD 11/06/2014,9:58 PM  ADDENDUM:  Heparin Level = 0.23 (goal 0.3-0.7) with heparin infusing @ 1200 units/hr Platelet count = 53  No complications of  therapy noted  Plan:  Increase IV heparin to 1350 units/hr            Check heparin level 6 hr after rate increase  Leone Haven, PharmD

## 2014-11-06 NOTE — Progress Notes (Signed)
ANTIBIOTIC CONSULT NOTE - INITIAL  Pharmacy Consult for Vancomycin Indication: Cellulitis  Allergies  Allergen Reactions  . Naprosyn [Naproxen] Nausea And Vomiting    Very nauseated but no vomiting.  . Metformin And Related Diarrhea    Pt has IBS     Patient Measurements: Height: 5\' 4"  (162.6 cm) Weight: 217 lb 14.4 oz (98.839 kg) IBW/kg (Calculated) : 54.7  Vital Signs: Temp: 98.3 F (36.8 C) (06/18 2138) Temp Source: Oral (06/18 2138) BP: 154/78 mmHg (06/18 2138) Pulse Rate: 88 (06/18 2138) Intake/Output from previous day:   Intake/Output from this shift:    Labs:  Recent Labs  11/06/14 1548  WBC 3.8*  HGB 12.0  PLT 46*  CREATININE 0.78   Estimated Creatinine Clearance: 80 mL/min (by C-G formula based on Cr of 0.78). No results for input(s): VANCOTROUGH, VANCOPEAK, VANCORANDOM, GENTTROUGH, GENTPEAK, GENTRANDOM, TOBRATROUGH, TOBRAPEAK, TOBRARND, AMIKACINPEAK, AMIKACINTROU, AMIKACIN in the last 72 hours.   Microbiology: No results found for this or any previous visit (from the past 720 hour(s)).  Medical History: Past Medical History  Diagnosis Date  . Anxiety   . Hypertension   . IBS (irritable bowel syndrome)   . Hyperplastic colon polyp   . Allergy   . Anemia   . Arthritis   . Diverticulosis   . Dilated aortic root     seen on prior echo but echo 05/2013 showed normal dimensions  . Bicuspid aortic valve   . LVE (left ventricular enlargement)     mild by echo 1/15 with EF 50-55%  . pancreatic ca w/ liver mets dx'd 07/2014  . Diabetes mellitus without complication     Medications:  Scheduled:  . [START ON 11/07/2014] amLODipine  5 mg Oral q morning - 10a  . calcium-vitamin D  1 tablet Oral BID  . [START ON 11/07/2014] FLUoxetine  20 mg Oral q morning - 10a  . [START ON 11/07/2014] insulin aspart  0-15 Units Subcutaneous TID WC  . insulin glargine  80 Units Subcutaneous QHS  . irbesartan  300 mg Oral Daily  . loratadine  10 mg Oral Daily  . [START  ON 11/07/2014] meloxicam  15 mg Oral q morning - 10a  . [START ON 11/07/2014] multivitamin with minerals  1 tablet Oral Daily  . [START ON 11/07/2014] omega-3 acid ethyl esters  1 g Oral Daily  . piperacillin-tazobactam (ZOSYN)  IV  3.375 g Intravenous 3 times per day  . sodium chloride  3 mL Intravenous Q12H  . vancomycin  1,000 mg Intravenous Q12H   Infusions:  . sodium chloride    . heparin     Assessment:  64 yr female with metastatic pancreatic cancer, undergoing chemotherapy presents with swelling of right lower leg.  Pt s/p fall 2 weeks ago.  Two recent counts of cellulitis reported in this affected leg.  Pharmacy consulted to dose Vancomycin for treatment of cellulitis.  MD dosing Zosyn.  CrCl = 80 ml/min  Blood cultures ordered  Goal of Therapy:  Vancomycin trough level 10-15 mcg/ml  Plan:  Measure antibiotic drug levels at steady state Follow up culture results   Zosyn per MD  Vancomycin 1000mg  IV q12h  Akeylah Hendel, Toribio Harbour, PharmD 11/06/2014,10:08 PM

## 2014-11-06 NOTE — Progress Notes (Signed)
VASCULAR LAB PRELIMINARY  PRELIMINARY  PRELIMINARY  PRELIMINARY  Bilateral lower extremity venous duplex  completed.    Preliminary report:  Right:  Non occlusive sub acute DVT noted in the CFV, FV, popliteal v, and possibly the calf veins.  No evidence of superficial thrombosis.  No Baker's cyst.  Left:  No obvious evidence of DVT.  Superficial thrombosis noted in the distal GSV from mid calf to ankle. .   Limited visiblity in bilateral calves.  Johnthan Axtman, RVT 11/06/2014, 6:34 PM

## 2014-11-06 NOTE — ED Notes (Signed)
Pt upset after being sent from walk in clinic to r/o blood clot of rt leg.  Swelling and redness noted to rt lower leg.  Pt states "I just wanted her to treat my cellulitis and she's sending me over here to rule out a freaking blood clot!".

## 2014-11-06 NOTE — ED Notes (Signed)
PA at bedside.

## 2014-11-06 NOTE — ED Provider Notes (Signed)
CSN: [id number]     Arrival date & time 11/06/14  1508 History   First MD Initiated Contact with Patient 11/06/14 1523     Chief Complaint  Patient presents with  . Leg Swelling     (Consider location/radiation/quality/duration/timing/severity/associated sxs/prior Treatment) HPI   64 year old female with history of pancreatic cancer with metastasis, hypertension, anxiety, anemia sent here from urgent care for swelling to right lower leg. Patient had a mechanical fall 2 weeks ago and suffered abrasions to bilateral lower extremities. She follow-up with her doctor a week later and was told that it does not look infected and for her to continue using antibiotic cream. Yesterday she noticed redness and swelling to her right lower extremities with tenderness to palpation. She follow-up at urgent care this morning for further evaluation and was recommended to come to the ER to rule out blood clot. At this time patient denies having any significant pain, no fever, chills, chest pain, shortness of breath, hemoptysis, numbness or weakness. She does have history of active cancer. She has never had any blood clot before. She has has 2 bouts of cellulitis to the affected leg in the past. Patient felt this is likely cellulitis.   Past Medical History  Diagnosis Date  . Anxiety   . Hypertension   . IBS (irritable bowel syndrome)   . Hyperplastic colon polyp   . Allergy   . Anemia   . Arthritis   . Diverticulosis   . Dilated aortic root     seen on prior echo but echo 05/2013 showed normal dimensions  . Bicuspid aortic valve   . LVE (left ventricular enlargement)     mild by echo 1/15 with EF 50-55%  . pancreatic ca w/ liver mets dx'd 07/2014   Past Surgical History  Procedure Laterality Date  . Tubal ligation      age 55  . Colonoscopy    . Polypectomy    . Tooth extraction      3 teeth  . Belpharoptosis repair     Family History  Problem Relation Age of Onset  . Colon cancer Neg Hx   .  Esophageal cancer Neg Hx   . Stomach cancer Neg Hx   . Asthma Mother    History  Substance Use Topics  . Smoking status: Never Smoker   . Smokeless tobacco: Never Used  . Alcohol Use: No   OB History    No data available     Review of Systems  All other systems reviewed and are negative.     Allergies  Naprosyn and Metformin and related  Home Medications   Prior to Admission medications   Medication Sig Start Date End Date Taking? Authorizing Provider  ALPRAZolam Duanne Moron) 0.5 MG tablet TAKE 1 TABLET THREE TIMES DAILY IF NEEDED FOR ANXIETY 09/14/14   Lafayette Dragon, MD  amLODipine (NORVASC) 5 MG tablet Take 5 mg by mouth every morning.  03/16/14   Historical Provider, MD  blood glucose meter kit and supplies KIT Dispense based on patient and insurance preference. Use up to four times daily as directed. Dx code: E11.65 07/15/14   Philemon Kingdom, MD  Calcium Carbonate-Vitamin D (CALCIUM 600/VITAMIN D) 600-400 MG-UNIT per tablet Take 1 tablet by mouth 2 (two) times daily.     Historical Provider, MD  cetirizine (ZYRTEC) 10 MG tablet Take 10 mg by mouth daily.    Historical Provider, MD  cholestyramine Lucrezia Starch) 4 G packet Take 1/2-1 packet dissolved in water/juice once  daily....................Marland KitchenPRN 01/24/11   Lafayette Dragon, MD  dexamethasone (DECADRON) 4 MG tablet Take 2 tablets (8 mg total) by mouth 2 (two) times daily with a meal. Start the day after chemotherapy for 2 days. Take with food. 11/02/14   Truitt Merle, MD  dextromethorphan (DELSYM) 30 MG/5ML liquid Take 15 mg by mouth 2 (two) times daily as needed for cough.    Historical Provider, MD  FLUoxetine (PROZAC) 20 MG capsule Take 20 mg by mouth every morning.     Historical Provider, MD  glucose blood (ACCU-CHEK AVIVA PLUS) test strip Test 4 times daily as instructed. 06/28/14   Philemon Kingdom, MD  insulin aspart (NOVOLOG FLEXPEN) 100 UNIT/ML FlexPen Inject 14-18 units into the skin 3 times daily as instructed. Patient taking  differently: Inject 20-24 Units into the skin 3 (three) times daily with meals. 80-99 20units 100-199 21 units 200-299 22 units  300 or greater 24 units 07/30/14   Philemon Kingdom, MD  insulin glargine (LANTUS) 100 UNIT/ML injection Inject 74 Units into the skin at bedtime.     Historical Provider, MD  Insulin Pen Needle (VALUMARK PEN NEEDLES) 31G X 8 MM MISC Use to inject insulin 4 times daily as instructed. 03/18/14   Philemon Kingdom, MD  LANCETS ULTRA THIN 30G MISC Use to test blood sugar 4 times daily as instructed. Dx code: E11.65 07/15/14   Philemon Kingdom, MD  lidocaine-prilocaine (EMLA) cream Apply 1 application topically as needed. 08/30/14   Truitt Merle, MD  lidocaine-prilocaine (EMLA) cream Apply to affected area once 11/02/14   Truitt Merle, MD  meloxicam (MOBIC) 15 MG tablet Take 15 mg by mouth every morning.     Historical Provider, MD  Multiple Vitamins-Minerals (CENTRUM SILVER PO) Take 1 capsule by mouth every morning.     Historical Provider, MD  Omega-3 Fatty Acids (FISH OIL) 1000 MG CAPS Take 1 capsule by mouth every morning.     Historical Provider, MD  ondansetron (ZOFRAN) 8 MG tablet Take 1 tablet (8 mg total) by mouth 2 (two) times daily. Start the day after chemo for 2 days. Then take as needed for nausea or vomiting. 11/02/14   Truitt Merle, MD  predniSONE (DELTASONE) 20 MG tablet Take 3 tablets (60 mg total) by mouth daily. 10/15/14   Carlton Adam, PA-C  predniSONE (DELTASONE) 5 MG tablet Take 2 tab daily on 6/14 and 6/15, then 1 tab on 6/16 and 6/17 then stop 10/28/14   Truitt Merle, MD  PREVNAR 13 SUSP injection Inject 0.5 mLs into the muscle once.  07/15/14   Historical Provider, MD  prochlorperazine (COMPAZINE) 10 MG tablet Take 1 tablet (10 mg total) by mouth every 6 (six) hours as needed for nausea or vomiting. 09/02/14   Truitt Merle, MD  prochlorperazine (COMPAZINE) 10 MG tablet Take 1 tablet (10 mg total) by mouth every 6 (six) hours as needed (Nausea or vomiting). 11/02/14   Truitt Merle, MD   valsartan-hydrochlorothiazide (DIOVAN-HCT) 320-12.5 MG per tablet Take 1 tablet by mouth every morning.  03/16/14   Historical Provider, MD  zolpidem (AMBIEN) 5 MG tablet Take 1 tablet (5 mg total) by mouth at bedtime as needed for sleep. 10/28/14   Truitt Merle, MD   BP 134/83 mmHg  Pulse 92  Temp(Src) 98.2 F (36.8 C) (Oral)  Resp 19  SpO2 98% Physical Exam  Constitutional: She appears well-developed and well-nourished. No distress.  Moderately obese Caucasian female, resting in bed appears to be in no acute distress.  HENT:  Head: Atraumatic.  Eyes: Conjunctivae are normal.  Neck: Neck supple.  Cardiovascular: Normal rate, regular rhythm and intact distal pulses.   Pulmonary/Chest: Effort normal and breath sounds normal.  Abdominal: Soft.  Neurological: She is alert.  Skin: Rash (Abrasions noted to bilateral shin.  Right lower extremities with moderate amount of erythema, edema, and warmth throughout that is circumferential. Compartment is soft. Diffuse tenderness to palpation but no palpable cords.) noted.  Psychiatric: She has a normal mood and affect.  Nursing note and vitals reviewed.   ED Course  Procedures (including critical care time)  3:43 PM Patient with abrasions to bilateral lower extremities with evidence of erythema, warmth, and edema to right lower extremity suggestive of cellulitis. However she does have history of cancer and will need to be ruled out for DVT. Will obtain venous Doppler study.  7:04 PM Lower extremities ultrasound demonstrate evidence of a nonocclusive subacute DVT in the right leg and superficial thrombosis in the left leg. Patient will need to be on blood thinner medication however her platelet is low. Patient states her cancer doctor and her PCP has taken her off aspirin and ibuprofen due to low platelets. Patient is also hypoglycemic with a CBG of 452. After receiving 5 units of insulin it has improved to 378. She has no anion gap.  Patient has  evidence of DVT and also cellulitis of right lower extremities. I discussed care with Dr. Zenia Resides, plan to consult hospitalist for further evaluation and possible admission.   Amber Bishop, Amber Bishop Female 05/14/1951 [id number]    Progress Notes by Nani Ravens, RVT at 11/06/2014 6:34 PM    Author: Nani Ravens, RVT Service: Vascular Lab Author Type: Cardiovascular Sonographer   Filed: 11/06/2014 6:36 PM Note Time: 11/06/2014 6:34 PM Status: Signed   Editor: Nani Ravens, RVT (Cardiovascular Sonographer)     Expand All Collapse All   VASCULAR LAB PRELIMINARY PRELIMINARY PRELIMINARY PRELIMINARY  Bilateral lower extremity venous duplex completed.   Preliminary report: Right: Non occlusive sub acute DVT noted in the CFV, FV, popliteal v, and possibly the calf veins. No evidence of superficial thrombosis. No Baker's cyst. Left: No obvious evidence of DVT. Superficial thrombosis noted in the distal GSV from mid calf to ankle. . Limited visiblity in bilateral calves.  Cestone,Amber Bishop, RVT 11/06/2014, 6:34 PM       Labs Review Labs Reviewed  CBC WITH DIFFERENTIAL/PLATELET - Abnormal; Notable for the following:    WBC 3.8 (*)    RDW 16.8 (*)    Platelets 46 (*)    Monocytes Relative 1 (*)    Monocytes Absolute 0.0 (*)    All other components within normal limits  BASIC METABOLIC PANEL - Abnormal; Notable for the following:    Sodium 132 (*)    Glucose, Bld 452 (*)    Calcium 7.9 (*)    Anion gap 3 (*)    All other components within normal limits  CBG MONITORING, ED - Abnormal; Notable for the following:    Glucose-Capillary 378 (*)    All other components within normal limits    Imaging Review No results found.   EKG Interpretation None      MDM   Final diagnoses:  Lower leg DVT (deep venous thrombosis), right  Cellulitis of right leg  Hyperglycemia    BP 161/86 mmHg  Pulse 90  Temp(Src) 98.2 F (36.8 C) (Oral)  Resp 17  SpO2 97%  I have  reviewed nursing notes and vital signs. I  personally viewed the imaging tests through PACS system and agrees with radiologist's intepretation I reviewed available ER/hospitalization records through the EMR     Domenic Moras, PA-C 11/06/14 2036  Lacretia Leigh, MD 11/06/14 2125

## 2014-11-06 NOTE — H&P (Signed)
[facility] History and Physical  Amber Bishop HQI:[id number] DOB: 06/16/1950 DOA: 11/06/2014  Referring physician: ED physician PCP: [name], MD  Specialists:   Chief Complaint:   HPI: Amber Bishop is a 64 y.o. female with PMH of hypertension, diabetes mellitus, depression, anxiety, IBS, arthritis, dilated aortic root, tricuspid aortic valve, pancreatic cancer metastasized to liver (doing chemotherapy currently), who presents with   Patient reports that she had a mechanical fall 2 weeks ago and suffered abrasions to bilateral lower extremities. She follow-up with her doctor a week later and was told that it does not look infected and instructed her to continue using antibiotic cream. Yesterday she noticed redness and swelling to her right lower extremities with tenderness to palpation. She follow-up at urgent care this morning for further evaluation and was recommended to come to the ER to rule out blood clot.  Currently patient denies fever, chills, running nose, ear pain, headaches, cough, chest pain, SOB, abdominal pain, diarrhea, constipation, dysuria, urgency, frequency, hematuria. No unilateral weakness, numbness or tingling sensations. No vision change or hearing loss.  In ED, patient was found to have WBC 3.8, platelet 46, temperature normal, no tachycardia, electrolytes okay. LE venous doppler showed: right with non occlusive sub acute DVT noted in the CFV, FV, popliteal v, and possibly the calf veins; left with superficial thrombosis noted in the distal GSV from mid calf to ankle. Limited visiblity in bilateral calves. Patient is admitted to inpatient for further evaluation and treatment.  Where does patient live?   At home    Can patient participate in ADLs? Some   Review of Systems:   General: no fevers, chills, no changes in body weight, has fatigue HEENT: no blurry vision, hearing changes or sore throat Pulm: no dyspnea, coughing, wheezing CV: no chest  pain, palpitations Abd: no nausea, vomiting, abdominal pain, diarrhea, constipation GU: no dysuria, burning on urination, increased urinary frequency, hematuria  Ext: has right leg edema and pain Neuro: no unilateral weakness, numbness, or tingling, no vision change or hearing loss Skin: has several small abrasions in right leg MSK: No muscle spasm, no deformity, no limitation of range of movement in spin Heme: No easy bruising.  Travel history: No recent long distant travel.  Allergy:  Allergies  Allergen Reactions  . Naprosyn [Naproxen] Nausea And Vomiting    Very nauseated but no vomiting.  . Metformin And Related Diarrhea    Pt has IBS     Past Medical History  Diagnosis Date  . Anxiety   . Hypertension   . IBS (irritable bowel syndrome)   . Hyperplastic colon polyp   . Allergy   . Anemia   . Arthritis   . Diverticulosis   . Dilated aortic root     seen on prior echo but echo 05/2013 showed normal dimensions  . Bicuspid aortic valve   . LVE (left ventricular enlargement)     mild by echo 1/15 with EF 50-55%  . pancreatic ca w/ liver mets dx'd 07/2014  . Diabetes mellitus without complication     Past Surgical History  Procedure Laterality Date  . Tubal ligation      age 3  . Colonoscopy    . Polypectomy    . Tooth extraction      3 teeth  . Belpharoptosis repair      Social History:  reports that she has never smoked. She has never used smokeless tobacco. She reports that she does not drink alcohol or use  illicit drugs.  Family History:  Family History  Problem Relation Age of Onset  . Colon cancer Neg Hx   . Esophageal cancer Neg Hx   . Stomach cancer Neg Hx   . Asthma Mother      Prior to Admission medications   Medication Sig Start Date End Date Taking? Authorizing Provider  ALPRAZolam Duanne Moron) 0.5 MG tablet TAKE 1 TABLET THREE TIMES DAILY IF NEEDED FOR ANXIETY 09/14/14  Yes Lafayette Dragon, MD  amLODipine (NORVASC) 5 MG tablet Take 5 mg by mouth every  morning.  03/16/14  Yes Historical Provider, MD  blood glucose meter kit and supplies KIT Dispense based on patient and insurance preference. Use up to four times daily as directed. Dx code: E11.65 07/15/14  Yes Philemon Kingdom, MD  Calcium Carbonate-Vitamin D (CALCIUM 600/VITAMIN D) 600-400 MG-UNIT per tablet Take 1 tablet by mouth 2 (two) times daily.    Yes Historical Provider, MD  cetirizine (ZYRTEC) 10 MG tablet Take 10 mg by mouth daily.   Yes Historical Provider, MD  cholestyramine Lucrezia Starch) 4 G packet Take 1/2-1 packet dissolved in water/juice once daily....................Marland KitchenPRN Patient taking differently: Take 2-4 g by mouth daily as needed (IBS).  01/24/11  Yes Lafayette Dragon, MD  dextromethorphan (DELSYM) 30 MG/5ML liquid Take 15 mg by mouth 2 (two) times daily as needed for cough.   Yes Historical Provider, MD  FLUoxetine (PROZAC) 20 MG capsule Take 20 mg by mouth every morning.    Yes Historical Provider, MD  glucose blood (ACCU-CHEK AVIVA PLUS) test strip Test 4 times daily as instructed. 06/28/14  Yes Philemon Kingdom, MD  insulin aspart (NOVOLOG FLEXPEN) 100 UNIT/ML FlexPen Inject 14-18 units into the skin 3 times daily as instructed. Patient taking differently: Inject 20-28 Units into the skin 3 (three) times daily with meals.  07/30/14  Yes Philemon Kingdom, MD  insulin glargine (LANTUS) 100 UNIT/ML injection Inject 74 Units into the skin at bedtime.    Yes Historical Provider, MD  Insulin Pen Needle (VALUMARK PEN NEEDLES) 31G X 8 MM MISC Use to inject insulin 4 times daily as instructed. 03/18/14  Yes Philemon Kingdom, MD  LANCETS ULTRA THIN 30G MISC Use to test blood sugar 4 times daily as instructed. Dx code: E11.65 07/15/14  Yes Philemon Kingdom, MD  lidocaine-prilocaine (EMLA) cream Apply 1 application topically as needed. 08/30/14  Yes Truitt Merle, MD  meloxicam (MOBIC) 15 MG tablet Take 15 mg by mouth every morning.    Yes Historical Provider, MD  Multiple Vitamins-Minerals (CENTRUM  SILVER PO) Take 1 capsule by mouth every morning.    Yes Historical Provider, MD  Omega-3 Fatty Acids (FISH OIL) 1000 MG CAPS Take 1 capsule by mouth every morning.    Yes Historical Provider, MD  ondansetron (ZOFRAN) 8 MG tablet Take 1 tablet (8 mg total) by mouth 2 (two) times daily. Start the day after chemo for 2 days. Then take as needed for nausea or vomiting. 11/02/14  Yes Truitt Merle, MD  OVER THE COUNTER MEDICATION Apply 1 drop topically 3 (three) times daily. Essential Oils.   Yes Historical Provider, MD  PREVNAR 13 SUSP injection Inject 0.5 mLs into the muscle once.  07/15/14  Yes Historical Provider, MD  valsartan-hydrochlorothiazide (DIOVAN-HCT) 320-12.5 MG per tablet Take 1 tablet by mouth every morning.  03/16/14  Yes Historical Provider, MD  dexamethasone (DECADRON) 4 MG tablet Take 2 tablets (8 mg total) by mouth 2 (two) times daily with a meal. Start the day after  chemotherapy for 2 days. Take with food. Patient not taking: Reported on 11/06/2014 11/02/14   Truitt Merle, MD  lidocaine-prilocaine (EMLA) cream Apply to affected area once Patient not taking: Reported on 11/06/2014 11/02/14   Truitt Merle, MD  predniSONE (DELTASONE) 20 MG tablet Take 3 tablets (60 mg total) by mouth daily. Patient not taking: Reported on 11/06/2014 10/15/14   Carlton Adam, PA-C  predniSONE (DELTASONE) 5 MG tablet Take 2 tab daily on 6/14 and 6/15, then 1 tab on 6/16 and 6/17 then stop Patient not taking: Reported on 11/06/2014 10/28/14   Truitt Merle, MD  prochlorperazine (COMPAZINE) 10 MG tablet Take 1 tablet (10 mg total) by mouth every 6 (six) hours as needed for nausea or vomiting. Patient not taking: Reported on 11/06/2014 09/02/14   Truitt Merle, MD  prochlorperazine (COMPAZINE) 10 MG tablet Take 1 tablet (10 mg total) by mouth every 6 (six) hours as needed (Nausea or vomiting). Patient not taking: Reported on 11/06/2014 11/02/14   Truitt Merle, MD  zolpidem (AMBIEN) 5 MG tablet Take 1 tablet (5 mg total) by mouth at bedtime as  needed for sleep. Patient not taking: Reported on 11/06/2014 10/28/14   Truitt Merle, MD    Physical Exam: Filed Vitals:   11/06/14 1518 11/06/14 1702 11/06/14 1854 11/06/14 2138  BP: 134/83 142/73 161/86 154/78  Pulse: 92 80 90 88  Temp: 98.2 F (36.8 C)   98.3 F (36.8 C)  TempSrc: Oral   Oral  Resp: _0 SpO2: 98% 100% 97% 100%   General: Not in acute distress HEENT:       Eyes: PERRL, EOMI, no scleral icterus.       ENT: No discharge from the ears and nose, no pharynx injection, no tonsillar enlargement.        Neck: No JVD, no bruit, no mass felt. Heme: No neck lymph node enlargement. Cardiac: S1/S2, RRR, No murmurs, No gallops or rubs. Pulm: No rales, wheezing, rhonchi or rubs. Abd: Soft, nondistended, nontender, no rebound pain, no organomegaly, BS present. Ext: has 1+ pitting leg edema in the right leg. 2+DP/PT pulse bilaterally. Musculoskeletal: No joint deformities, No joint redness or warmth, no limitation of ROM in spin. Skin: severe small abrasions noted to bilateral shin. Right lower extremities with moderate amount of erythema, edema, and warmth throughout that is circumferential. Compartment is soft. Diffuse tenderness to palpation but no palpable cords. Neuro: Alert, oriented X3, cranial nerves II-XII grossly intact, muscle strength 5/5 in all extremities, sensation to light touch intact.  Psych: Patient is not psychotic, no suicidal or hemocidal ideation.  Labs on Admission:  Basic Metabolic Panel:  Recent Labs Lab 11/02/14 0955 11/06/14 1548  NA 140 132*  K 3.4* 4.5  CL  --  103  CO2 29 26  GLUCOSE 100 452*  BUN 13.6 20  CREATININE 0.8 0.78  CALCIUM 8.9 7.9*   Liver Function Tests:  Recent Labs Lab 11/02/14 0955  AST 158*  ALT 351*  ALKPHOS 255*  BILITOT 1.01  PROT 5.9*  ALBUMIN 3.2*   No results for input(s): LIPASE, AMYLASE in the last 168 hours. No results for input(s): AMMONIA in the last 168 hours. CBC:  Recent Labs Lab  11/02/14 0956 11/06/14 1548  WBC 7.5 3.8*  NEUTROABS 5.4 2.8  HGB 13.8 12.0  HCT 42.4 36.7  MCV 88.1 89.7  PLT 88* 46*   Cardiac Enzymes: No results for input(s): CKTOTAL, CKMB, CKMBINDEX, TROPONINI in the last 168 hours.  BNP (last 3 results) No results for input(s): BNP in the last 8760 hours.  ProBNP (last 3 results) No results for input(s): PROBNP in the last 8760 hours.  CBG:  Recent Labs Lab 11/06/14 1855  GLUCAP 378*    Radiological Exams on Admission: No results found.  EKG: Not done in ED, will get one.   Assessment/Plan Principal Problem:   DVT (deep venous thrombosis) Active Problems:   Anxiety state   Essential hypertension   Irritable bowel syndrome   Diabetes mellitus type 2, uncontrolled, without complications   Dilated aortic root   Bicuspid aortic valve   LVE (left ventricular enlargement)   Pancreatic cancer metastasized to liver   Right leg DVT   DVT (deep vein thrombosis) in pregnancy   Cellulitis of right leg  Right leg DVT: Patient has right leg DVT as evidenced by venous Doppler  No signs of PE.  - Admit to tele bed - Anticoagulation: discussed with pharmacist, Ms Marylin Crosby suggested to start IV heparin given her low platelets of 46. It will be easier to discontinue in case her platelet drops sharply. - Symptomatic treatment: prn zofran for nausea and Percocet and morphine for pain.  - EKG for monitoring QTc   Right leg cellulitis: she also has cellulitis in right leg. Patient is not septic on admission. Since patient is immunosuppressed due to chemotherapy, she needs antibodies with broad coverage. - Empiric antimicrobial treatment with vancomycin IV and zosyn - Blood cultures x 2  -will get Procalcitonin and trend lactic acid levels -IVF: NS 125 cc/h  Depression and anxiety: Stable, no suicidal or homicidal ideations. -Continue home medications: When necessary Xanax, Prozac  Essential hypertension: -Switch Diovan-HCTZ to  irbesartan -Continue amlodipine -IV hydralazine when necessary  DM-II: Last A1c 10.1 on 05/05/14, poorly controled. Patient is taking Lantus 74 units daily at home. Blood sugar is elevated at 452 on admission, likely due to ongoing infection -will increase Lantus dose from 74 to 80 units daily -SSI-moderate  Pancreatic cancer metastasized to liver: diagnosed on 07/2014. Followed up with Dr. Burr Medico. She is currently receiving chemotherapy, completed 4 cycles so far.  -follow up with Dr. Burr Medico  Thrombocytopenia: per Dr. Ernestina Penna note, this appears to be chronic, no cirrhosis on CT scan. 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 Burr Medico. Given the overall poor prognosis from a metastatic pancreas cancer, bone marrow biopsy was not recommended. Today platelet 46. Nobleeding tendency. -CBC daily while on Heparin gtt  DVT ppx: on IV heparin Code Status: Full code Family Communication:  Yes, patient's   bodyfriend    at bed side Disposition Plan: Admit to inpatient   Date of Service 11/06/2014    Ivor Costa [facility] Pager [phone]  If 7PM-7AM, please contact night-coverage [email or web address] Password [facility] 11/06/2014, 9:39 PM

## 2014-11-06 NOTE — ED Notes (Signed)
Pt reports fell about 1.5 weeks ago; noticed warm, swelling, and redness onset last night. Here from walk in clinic to r/o cellulitis or blood clot.

## 2014-11-07 ENCOUNTER — Encounter (HOSPITAL_COMMUNITY): Payer: Self-pay | Admitting: *Deleted

## 2014-11-07 DIAGNOSIS — C259 Malignant neoplasm of pancreas, unspecified: Secondary | ICD-10-CM

## 2014-11-07 DIAGNOSIS — E1165 Type 2 diabetes mellitus with hyperglycemia: Secondary | ICD-10-CM

## 2014-11-07 LAB — CBC
HEMATOCRIT: 40.7 % (ref 36.0–46.0)
Hemoglobin: 12.9 g/dL (ref 12.0–15.0)
MCH: 28.6 pg (ref 26.0–34.0)
MCHC: 31.7 g/dL (ref 30.0–36.0)
MCV: 90.2 fL (ref 78.0–100.0)
PLATELETS: 53 10*3/uL — AB (ref 150–400)
RBC: 4.51 MIL/uL (ref 3.87–5.11)
RDW: 16.8 % — ABNORMAL HIGH (ref 11.5–15.5)
WBC: 4.5 10*3/uL (ref 4.0–10.5)

## 2014-11-07 LAB — COMPREHENSIVE METABOLIC PANEL
ALT: 229 U/L — ABNORMAL HIGH (ref 14–54)
ANION GAP: 11 (ref 5–15)
AST: 107 U/L — ABNORMAL HIGH (ref 15–41)
Albumin: 3.5 g/dL (ref 3.5–5.0)
Alkaline Phosphatase: 251 U/L — ABNORMAL HIGH (ref 38–126)
BILIRUBIN TOTAL: 1.5 mg/dL — AB (ref 0.3–1.2)
BUN: 13 mg/dL (ref 6–20)
CALCIUM: 8.5 mg/dL — AB (ref 8.9–10.3)
CHLORIDE: 98 mmol/L — AB (ref 101–111)
CO2: 28 mmol/L (ref 22–32)
Creatinine, Ser: 0.66 mg/dL (ref 0.44–1.00)
GFR calc Af Amer: >60 mL/min (ref >60)
GFR calc non Af Amer: >60 mL/min (ref >60)
Glucose, Bld: 301 mg/dL — ABNORMAL HIGH (ref 65–99)
POTASSIUM: 4 mmol/L (ref 3.5–5.1)
Sodium: 137 mmol/L (ref 135–145)
TOTAL PROTEIN: 6.3 g/dL — AB (ref 6.5–8.1)

## 2014-11-07 LAB — GLUCOSE, CAPILLARY
GLUCOSE-CAPILLARY: 282 mg/dL — AB (ref 65–99)
Glucose-Capillary: 243 mg/dL — ABNORMAL HIGH (ref 65–99)
Glucose-Capillary: 257 mg/dL — ABNORMAL HIGH (ref 65–99)

## 2014-11-07 LAB — HEPARIN LEVEL (UNFRACTIONATED)
HEPARIN UNFRACTIONATED: 0.23 [IU]/mL — AB (ref 0.30–0.70)
Heparin Unfractionated: 0.34 [IU]/mL (ref 0.30–0.70)
Heparin Unfractionated: 0.4 [IU]/mL (ref 0.30–0.70)

## 2014-11-07 MED ORDER — INSULIN ASPART 100 UNIT/ML ~~LOC~~ SOLN
0.0000 [IU] | Freq: Three times a day (TID) | SUBCUTANEOUS | Status: DC
  Administered 2014-11-07: 11 [IU] via SUBCUTANEOUS
  Administered 2014-11-08 (×2): 7 [IU] via SUBCUTANEOUS
  Administered 2014-11-08: 3 [IU] via SUBCUTANEOUS
  Administered 2014-11-09 (×2): 4 [IU] via SUBCUTANEOUS
  Administered 2014-11-10 (×2): 7 [IU] via SUBCUTANEOUS
  Administered 2014-11-10 – 2014-11-11 (×2): 3 [IU] via SUBCUTANEOUS
  Administered 2014-11-11: 11 [IU] via SUBCUTANEOUS
  Administered 2014-11-11: 7 [IU] via SUBCUTANEOUS
  Administered 2014-11-12: 15 [IU] via SUBCUTANEOUS
  Administered 2014-11-12: 4 [IU] via SUBCUTANEOUS
  Administered 2014-11-12 – 2014-11-13 (×2): 7 [IU] via SUBCUTANEOUS
  Administered 2014-11-13 (×2): 4 [IU] via SUBCUTANEOUS
  Administered 2014-11-14: 3 [IU] via SUBCUTANEOUS
  Administered 2014-11-14: 7 [IU] via SUBCUTANEOUS
  Administered 2014-11-15 (×2): 3 [IU] via SUBCUTANEOUS

## 2014-11-07 MED ORDER — HEPARIN (PORCINE) IN NACL 100-0.45 UNIT/ML-% IJ SOLN
1350.0000 [IU]/h | INTRAMUSCULAR | Status: DC
  Administered 2014-11-07 – 2014-11-11 (×6): 1350 [IU]/h via INTRAVENOUS
  Filled 2014-11-07 (×6): qty 250

## 2014-11-07 MED ORDER — INSULIN ASPART 100 UNIT/ML ~~LOC~~ SOLN
0.0000 [IU] | Freq: Every day | SUBCUTANEOUS | Status: DC
  Administered 2014-11-07: 2 [IU] via SUBCUTANEOUS
  Administered 2014-11-09: 3 [IU] via SUBCUTANEOUS
  Administered 2014-11-10 – 2014-11-12 (×2): 2 [IU] via SUBCUTANEOUS

## 2014-11-07 MED ORDER — OXYCODONE-ACETAMINOPHEN 5-325 MG PO TABS
2.0000 | ORAL_TABLET | Freq: Four times a day (QID) | ORAL | Status: DC | PRN
  Administered 2014-11-07 – 2014-11-08 (×2): 2 via ORAL
  Filled 2014-11-07 (×2): qty 2

## 2014-11-07 MED ORDER — INSULIN ASPART 100 UNIT/ML ~~LOC~~ SOLN
3.0000 [IU] | Freq: Three times a day (TID) | SUBCUTANEOUS | Status: DC
  Administered 2014-11-07 – 2014-11-08 (×3): 3 [IU] via SUBCUTANEOUS

## 2014-11-07 NOTE — Progress Notes (Signed)
PROGRESS NOTE    Amber Bishop SEG:[id number] DOB: December 28, 1950 DOA: 11/06/2014 PCP: [name], MD  HPI/Brief narrative 64 y.o. female with PMH of hypertension, diabetes mellitus, depression, anxiety, IBS, arthritis, dilated aortic root, tricuspid aortic valve, pancreatic cancer metastasized to liver (doing chemotherapy currently), who presents with RLE cellulitis. In ED, patient was found to have WBC 3.8, platelet 46, temperature normal, no tachycardia, electrolytes okay. LE venous doppler showed: right with non occlusive sub acute DVT noted in the CFV, FV, popliteal v, and possibly the calf veins; left with superficial thrombosis noted in the distal GSV from mid calf to ankle. Limited visiblity in bilateral calves. Patient is admitted to inpatient for further evaluation and treatment.   Assessment/Plan:  Right leg DVT:  - Patient has right leg DVT as evidenced by venous Doppler No signs of PE.  - Patient was empirically started on IV heparin drip due to ease of discontinuation if her thrombocytopenia got worse. Presented with platelet count of 46. - will discuss with Dr. Burr Medico in a.m. regarding choice of long-term anticoagulation-? Lovenox versus Coumadin.  Right leg cellulitis:  - she also has cellulitis in right leg. Patient was not septic on admission. Since patient is immunosuppressed due to chemotherapy, she needs antibodies with broad coverage. - Empiric antimicrobial treatment with vancomycin IV and zosyn - Blood cultures x 2: In process. - Improving. Elevate leg.   Depression and anxiety:  - Stable, no suicidal or homicidal ideations. -Continue home medications: When necessary Xanax, Prozac  Essential hypertension: -Switch Diovan-HCTZ to irbesartan -Continue amlodipine -IV hydralazine when necessary - Reasonably controlled  DM-II:  - Last A1c 10.1 on 05/05/14, poorly controled.  - Patient is taking Lantus 74 units daily at home. Increased Lantus dose from 74 to  80 units daily due to CBGs in the 400s on admission. CBGs now in the 200s. Adjust sliding scale and add mealtime NovoLog -SSI-moderate  Pancreatic cancer metastasized to liver:  - diagnosed on 07/2014. Followed up with Dr. Burr Medico. She is currently receiving chemotherapy, completed 4 cycles so far.  -follow up with Dr. Burr Medico. Will discuss with her in a.m. -Abnormal LFTs likely due to this.   Thrombocytopenia:  - per Dr. Ernestina Penna note, this appears to be chronic, no cirrhosis on CT scan. 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 Burr Medico. Given the overall poor prognosis from a metastatic pancreas cancer, bone marrow biopsy was not recommended. Today platelet 53. Nobleeding tendency. -CBC daily while on Heparin gtt  DVT ppx: on IV heparin Code Status: Full Family Communication: DC home when medically stable Disposition Plan: None at bedside   Consultants:  None  Procedures:  Bilateral lower extremity venous Dopplers 11/06/14: Summary:  - Findings consistent with subacute deep vein thrombosis involving the right common femoral vein, right femoral vein, and right popliteal vein. - Cannot exclude deep vein thrombosis involving the right posterial tibial vein and right peroneal vein. - No obvious evidence of deep vein thrombosis involving the left lower extremity. - Findings consistent with acute superficial vein thrombosis involving the left greater saphenous vein from mid calf to ankle.   Antibiotics:  IV vancomycin 6/18 >  IV Zosyn 6/18 >   Subjective: Feels better. Decreased pain, redness and swelling of right lower extremity. Denies pain in the left lower extremity.  Objective: Filed Vitals:   11/06/14 1854 11/06/14 2138 11/07/14 0500 11/07/14 1346  BP:  161/86 154/78 139/73 131/71  Pulse: 90 88 94 74  Temp:  98.3 F (36.8 C) 97.8 F (36.6 C) 97.9  F (36.6 C)  TempSrc:  Oral Oral Oral  Resp: _0 Height:  _1  (1.626 m)    Weight:  98.839 kg (217 lb 14.4 oz)    SpO2: 97% 100% 100% 100%    Intake/Output Summary (Last 24 hours) at 11/07/14 1411 Last data filed at 11/07/14 1330  Gross per 24 hour  Intake 1845.87 ml  Output    250 ml  Net 1595.87 ml   Filed Weights   11/06/14 2138  Weight: 98.839 kg (217 lb 14.4 oz)     Exam:  General exam: Pleasant middle-aged female lying comfortably in bed Respiratory system: Clear. No increased work of breathing. Cardiovascular system: S1 & S2 heard, RRR. No JVD, murmurs, gallops, clicks or pedal edema. Telemetry: Sinus rhythm. Gastrointestinal system: Abdomen is nondistended, soft and nontender. Normal bowel sounds heard. Central nervous system: Alert and oriented. No focal neurological deficits. Extremities: Symmetric 5 x 5 power. Right leg with patchy faint erythema and mild increased warmth. Scabs of healing abrasions bilateral knees/legs. Trace bilateral leg edema   Data Reviewed: Basic Metabolic Panel:  Recent Labs Lab 11/02/14 0955 11/06/14 1548 11/07/14 0501  NA 140 132* 137  K 3.4* 4.5 4.0  CL  --  103 98*  CO2 _2 GLUCOSE 100 452* 301*  BUN 13._3 CREATININE 0.8 0.78 0.66  CALCIUM 8.9 7.9* 8.5*   Liver Function Tests:  Recent Labs Lab 11/02/14 0955 11/07/14 0501  AST 158* 107*  ALT 351* 229*  ALKPHOS 255* 251*  BILITOT 1.01 1.5*  PROT 5.9* 6.3*  ALBUMIN 3.2* 3.5   No results for input(s): LIPASE, AMYLASE in the last 168 hours. No results for input(s): AMMONIA in the last 168 hours. CBC:  Recent Labs Lab 11/02/14 0956 11/06/14 1548 11/07/14 0501  WBC 7.5 3.8* 4.5  NEUTROABS 5.4 2.8  --   HGB 13.8 12.0 12.9  HCT 42.4 36.7 40.7  MCV 88.1 89.7 90.2  PLT 88* 46* 53*   Cardiac Enzymes: No results for input(s): CKTOTAL, CKMB, CKMBINDEX, TROPONINI in the last 168 hours. BNP (last 3 results) No results for input(s): PROBNP in  the last 8760 hours. CBG:  Recent Labs Lab 11/06/14 1855 11/06/14 2144 11/07/14 0744 11/07/14 1151  GLUCAP 378* 383* 243* 282*    No results found for this or any previous visit (from the past 240 hour(s)).       Studies: No results found.      Scheduled Meds: . amLODipine  5 mg Oral q morning - 10a  . calcium-vitamin D  1 tablet Oral BID  . FLUoxetine  20 mg Oral q morning - 10a  . insulin aspart  0-15 Units Subcutaneous TID WC  . insulin glargine  80 Units Subcutaneous QHS  . irbesartan  300 mg Oral Daily  . loratadine  10 mg Oral Daily  . meloxicam  15 mg Oral q morning - 10a  . multivitamin with minerals  1 tablet Oral Daily  . omega-3 acid ethyl esters  1 g Oral Daily  . piperacillin-tazobactam (ZOSYN)  IV  3.375 g Intravenous 3 times per day  . sodium chloride  3 mL Intravenous Q12H  . vancomycin  1,000 mg Intravenous Q12H   Continuous Infusions: . sodium chloride 125 mL/hr at 11/07/14 0923  . heparin 1,350 Units/hr (11/07/14 0617)  Principal Problem:   DVT (deep venous thrombosis) Active Problems:   Anxiety state   Essential hypertension   Irritable bowel syndrome   Diabetes mellitus type 2, uncontrolled, without complications   Dilated aortic root   Bicuspid aortic valve   LVE (left ventricular enlargement)   Pancreatic cancer metastasized to liver   Right leg DVT   DVT (deep vein thrombosis) in pregnancy   Cellulitis of right leg    Time spent: 40 minutes.    Vernell Leep, MD, FACP, FHM. [facility] Pager [phone]  If 7PM-7AM, please contact night-coverage [email or web address] Password [email or web address] 11/07/2014, 2:11 PM    LOS: 1 day

## 2014-11-07 NOTE — Progress Notes (Signed)
ANTICOAGULATION CONSULT NOTE - follow-up  Pharmacy Consult for Heparin Indication: DVT  Allergies  Allergen Reactions  . Naprosyn [Naproxen] Nausea And Vomiting    Very nauseated but no vomiting.  . Metformin And Related Diarrhea    Pt has IBS     Patient Measurements: Height: 5\' 4"  (162.6 cm) Weight: 217 lb 14.4 oz (98.839 kg) IBW/kg (Calculated) : 54.7 Heparin Dosing Weight: 77 kg  Vital Signs: Temp: 97.9 F (36.6 C) (06/19 1346) Temp Source: Oral (06/19 1346) BP: 131/71 mmHg (06/19 1346) Pulse Rate: 74 (06/19 1346)  Labs:  Recent Labs  11/06/14 1548 11/06/14 2150 11/07/14 0501 11/07/14 1300  HGB 12.0  --  12.9  --   HCT 36.7  --  40.7  --   PLT 46*  --  53*  --   APTT  --  28  --   --   LABPROT  --  14.3  --   --   INR  --  1.09  --   --   HEPARINUNFRC  --   --  0.23* 0.34  CREATININE 0.78  --  0.66  --     Estimated Creatinine Clearance: 80 mL/min (by C-G formula based on Cr of 0.66).   Medical History: Past Medical History  Diagnosis Date  . Anxiety   . Hypertension   . IBS (irritable bowel syndrome)   . Hyperplastic colon polyp   . Allergy   . Anemia   . Arthritis   . Diverticulosis   . Dilated aortic root     seen on prior echo but echo 05/2013 showed normal dimensions  . Bicuspid aortic valve   . LVE (left ventricular enlargement)     mild by echo 1/15 with EF 50-55%  . pancreatic ca w/ liver mets dx'd 07/2014  . Diabetes mellitus without complication     Medications:  Scheduled:  . amLODipine  5 mg Oral q morning - 10a  . calcium-vitamin D  1 tablet Oral BID  . FLUoxetine  20 mg Oral q morning - 10a  . insulin aspart  0-15 Units Subcutaneous TID WC  . insulin glargine  80 Units Subcutaneous QHS  . irbesartan  300 mg Oral Daily  . loratadine  10 mg Oral Daily  . meloxicam  15 mg Oral q morning - 10a  . multivitamin with minerals  1 tablet Oral Daily  . omega-3 acid ethyl esters  1 g Oral Daily  . piperacillin-tazobactam (ZOSYN)  IV   3.375 g Intravenous 3 times per day  . sodium chloride  3 mL Intravenous Q12H  . vancomycin  1,000 mg Intravenous Q12H   Infusions:  . sodium chloride 125 mL/hr at 11/07/14 0923  . heparin 1,350 Units/hr (11/07/14 0617)    Assessment:  64 yr female with metastatic pancreatic cancer, undergoing chemotherapy presents with swelling of right lower leg.  Pt s/p fall 2 weeks ago.  Two recent counts of cellulitis reported in this affected leg.  Doppler = + DVT or right lower extremity  Pharmacy initially consulted to dose Lovenox for treatment of DVT.  Platelet count noted to be 46.  Spoke with Dr Blaine Hamper and received orders to initiate anticoagulation with IV heparin (d/c lovenox order), which is easily reversible.  Plan to switch to heparin in next couple of days if PLTC remains stable and no complications of therapy noted.  Today, 11/07/2014:  Heparin level = 0.34 (therapeutic) on heparin 1350 units/hr (rate increased this morning for subtherapeutic level)  CBC:  Hgb WNL, pltc = 53  Goal of Therapy:  Heparin level 0.3-0.7 units/ml, preferably 0.3-0.5 with thrombocytopenia Monitor platelets by anticoagulation protocol: Yes   Plan:   Continue heparin gtt at 1350 units/hr  Check confirmatory heparin level this evening  Follow heparin level & CBC daily  Suggest enoxaparin for long-term treatment with active malignancy once platelet count stabilizes  Doreene Eland, PharmD, BCPS.   Pager: [phone] 11/07/2014,2:01 PM

## 2014-11-07 NOTE — Progress Notes (Signed)
Pharmacy: [facility]  Patient's a 64 y.o F currently on heparin for new DVT.  Confirmatory heparin level now back therapeutic at 0.40 (goal 0.3-0.7).  No bleeding documented.  Plan: - continue heparin drip at 1350 units/hr - f/u with AM labs and adjust rate if needed  Dia Sitter, PharmD, BCPS 11/07/2014 6:55 PM

## 2014-11-07 NOTE — Evaluation (Signed)
Physical Therapy Evaluation Patient Details Name: Amber Bishop MRN: [id number] DOB: [birth year] Today's Date: 11/07/2014   History of Present Illness  Amber Bishop is a 64 y.o. female with PMH of hypertension, diabetes mellitus, depression, anxiety, IBS, arthritis, dilated aortic root, tricuspid aortic valve, pancreatic cancer metastasized to liver (doing chemotherapy currently), who presents with  RLE cellulitis, + DVT  Clinical Impression  Pt admitted with above diagnosis. Pt currently with functional limitations due to the deficits listed below (see PT Problem List).  Pt will benefit from skilled PT to increase their independence and safety with mobility to allow discharge to the venue listed below.   Conflicting opinions regarding pt permission to mobilize per pt, RN, etc; Per pharmacy notes from 2pm today pt Heparin levels are therapeutic at .34; activity orders are activity as tol;  Therefore, we did proceed with PT eval although limited activity to a short household distance; Pt will benefit from HHPT as she has had a fall,lives alone and is certainly deconditioned from her baseline (prior to chemotherapy)     Follow Up Recommendations Home health PT    Equipment Recommendations  None recommended by PT    Recommendations for Other Services       Precautions / Restrictions Precautions Precautions: Fall      Mobility  Bed Mobility Overal bed mobility: Needs Assistance Bed Mobility: Supine to Sit     Supine to sit: Supervision     General bed mobility comments: for lines and safety  Transfers Overall transfer level: Needs assistance Equipment used: None Transfers: Sit to/from Stand Sit to Stand: Supervision         General transfer comment: cues for safety and to avoid being tangled in multiple  lines  Ambulation/Gait Ambulation/Gait assistance: Min guard;Supervision Ambulation Distance (Feet): 50 Feet Assistive device: None Gait Pattern/deviations:  Step-through pattern;Drifts right/left        Stairs            Wheelchair Mobility    Modified Rankin (Stroke Patients Only)       Balance Overall balance assessment: History of Falls   Sitting balance-Leahy Scale: Good       Standing balance-Leahy Scale: Good                               Pertinent Vitals/Pain Pain Assessment: No/denies pain    Home Living Family/patient expects to be discharged to:: Private residence Living Arrangements: Alone Available Help at Discharge: Family Type of Home: Apartment       Home Layout: One level Home Equipment: None Additional Comments: pt reports feeling easily fatigued d/t her cancer tx's; needs frequent rest breaks, has difficulty with laundry as she has to go to the laundromat since her apt does not have w/d    Prior Function Level of Independence: Independent               Hand Dominance        Extremity/Trunk Assessment   Upper Extremity Assessment: Defer to OT evaluation           Lower Extremity Assessment: Overall WFL for tasks assessed;RLE deficits/detail RLE Deficits / Details: edematous d/t DVT       Communication   Communication: No difficulties  Cognition Arousal/Alertness: Awake/alert Behavior During Therapy: WFL for tasks assessed/performed   Area of Impairment: Attention;Safety/judgement   Current Attention Level: Sustained     Safety/Judgement: Decreased awareness of safety  Problem Solving: Requires verbal cues General Comments: pt is easily distracted and requires frequent redirection with tasks and to get anwers to home living questions;     General Comments      Exercises        Assessment/Plan    PT Assessment Patient needs continued PT services  PT Diagnosis Difficulty walking   PT Problem List Decreased activity tolerance;Decreased mobility;Decreased safety awareness  PT Treatment Interventions Gait training;Functional mobility  training;Therapeutic activities;Therapeutic exercise;Balance training;Patient/family education   PT Goals (Current goals can be found in the Care Plan section) Acute Rehab PT Goals Patient Stated Goal: to be able to do more again, better activitly tol PT Goal Formulation: With patient Time For Goal Achievement: 11/14/14 Potential to Achieve Goals: Good    Frequency Min 3X/week   Barriers to discharge        Co-evaluation               End of Session   Activity Tolerance: Patient tolerated treatment well Patient left: in chair;with call bell/phone within reach Nurse Communication: Mobility status         Time: 3875-6433 PT Time Calculation (min) (ACUTE ONLY): 21 min   Charges:   PT Evaluation $Initial PT Evaluation Tier I: 1 Procedure     PT G CodesKenyon Ana 12-06-2014, 4:34 PM

## 2014-11-08 ENCOUNTER — Telehealth: Payer: Self-pay | Admitting: *Deleted

## 2014-11-08 DIAGNOSIS — L03115 Cellulitis of right lower limb: Secondary | ICD-10-CM

## 2014-11-08 DIAGNOSIS — T451X5A Adverse effect of antineoplastic and immunosuppressive drugs, initial encounter: Secondary | ICD-10-CM | POA: Insufficient documentation

## 2014-11-08 DIAGNOSIS — D696 Thrombocytopenia, unspecified: Secondary | ICD-10-CM

## 2014-11-08 DIAGNOSIS — D61810 Antineoplastic chemotherapy induced pancytopenia: Secondary | ICD-10-CM | POA: Insufficient documentation

## 2014-11-08 DIAGNOSIS — C787 Secondary malignant neoplasm of liver and intrahepatic bile duct: Secondary | ICD-10-CM

## 2014-11-08 DIAGNOSIS — I82501 Chronic embolism and thrombosis of unspecified deep veins of right lower extremity: Secondary | ICD-10-CM

## 2014-11-08 DIAGNOSIS — C251 Malignant neoplasm of body of pancreas: Secondary | ICD-10-CM

## 2014-11-08 LAB — CBC
HCT: 34.3 % — ABNORMAL LOW (ref 36.0–46.0)
Hemoglobin: 10.6 g/dL — ABNORMAL LOW (ref 12.0–15.0)
MCH: 27.6 pg (ref 26.0–34.0)
MCHC: 30.9 g/dL (ref 30.0–36.0)
MCV: 89.3 fL (ref 78.0–100.0)
PLATELETS: 34 10*3/uL — AB (ref 150–400)
RBC: 3.84 MIL/uL — AB (ref 3.87–5.11)
RDW: 16.5 % — AB (ref 11.5–15.5)
WBC: 2.8 10*3/uL — ABNORMAL LOW (ref 4.0–10.5)

## 2014-11-08 LAB — GLUCOSE, CAPILLARY
GLUCOSE-CAPILLARY: 165 mg/dL — AB (ref 65–99)
Glucose-Capillary: 236 mg/dL — ABNORMAL HIGH (ref 65–99)
Glucose-Capillary: 148 mg/dL — ABNORMAL HIGH (ref 65–99)
Glucose-Capillary: 244 mg/dL — ABNORMAL HIGH (ref 65–99)
Glucose-Capillary: 242 mg/dL — ABNORMAL HIGH (ref 65–99)

## 2014-11-08 LAB — HEPARIN LEVEL (UNFRACTIONATED): Heparin Unfractionated: 0.42 IU/mL (ref 0.30–0.70)

## 2014-11-08 MED ORDER — INSULIN ASPART 100 UNIT/ML ~~LOC~~ SOLN
6.0000 [IU] | Freq: Three times a day (TID) | SUBCUTANEOUS | Status: DC
  Administered 2014-11-08 – 2014-11-12 (×12): 6 [IU] via SUBCUTANEOUS

## 2014-11-08 NOTE — Progress Notes (Addendum)
PROGRESS NOTE    Amber Bishop:[id number] DOB: 08-Sep-1950 DOA: 11/06/2014 PCP: [name], MD  HPI/Brief narrative 64 y.o. female with PMH of hypertension, diabetes mellitus, depression, anxiety, IBS, arthritis, dilated aortic root, tricuspid aortic valve, pancreatic cancer metastasized to liver (doing chemotherapy currently), who presents with RLE cellulitis. In ED, patient was found to have WBC 3.8, platelet 46, temperature normal, no tachycardia, electrolytes okay. LE venous doppler showed: right with non occlusive sub acute DVT noted in the CFV, FV, popliteal v, and possibly the calf veins; left with superficial thrombosis noted in the distal GSV from mid calf to ankle. Limited visiblity in bilateral calves. Patient is admitted to inpatient for further evaluation and treatment.   Assessment/Plan:  Right leg DVT:  - Patient has right leg DVT as evidenced by venous Doppler No signs of PE.  - Patient was empirically started on IV heparin drip due to ease of discontinuation if her thrombocytopenia got worse. Presented with platelet count of 46. - Oncology Dr. Burr Medico consulted: She discussed extensively with patient and recommends Coumadin bridging with target INR 2-2.5  Right leg cellulitis:  - she also has cellulitis in right leg. Patient was not septic on admission. Since patient is immunosuppressed due to chemotherapy, she needs antibodies with broad coverage. - Empiric antimicrobial treatment with vancomycin IV and zosyn - Blood cultures x 2: Negative to date - Improving. Elevate leg.  - Now her right leg findings seem more like? Petechiae. DC Zosyn.  Depression and anxiety:  - Stable, no suicidal or homicidal ideations. -Continue home medications: When necessary Xanax, Prozac  Essential hypertension: -Switch Diovan-HCTZ to irbesartan -Continue amlodipine -IV hydralazine when necessary - Reasonably controlled  DM-II:  - Last A1c 10.1 on 05/05/14, poorly controled.    - Patient is taking Lantus 74 units daily at home. Increased Lantus dose from 74 to 80 units daily due to CBGs in the 400s on admission. CBGs now in the 200s. Adjust sliding scale and add mealtime NovoLog -SSI-moderate  Stage IV Pancreatic cancer metastasized to liver:  - diagnosed on 07/2014. Followed up with Dr. Burr Medico. She is currently receiving chemotherapy, completed 4 cycles so far.  -Oncologist input appreciated. Chemotherapy scheduled for tomorrow has been canceled due to her cytopenias. -Abnormal LFTs likely due to this.   Thrombocytopenia/pancytopenia:  - DD: ITP, hypersplenism and chemotherapy -CBC daily while on Heparin gtt - Platelet count have fluctuated from 46 > 53 > 34. No overt bleeding. Follow CBCs while on anticoagulation. Transfuse platelets if active bleeding or if counts drop? Less than 20,000.  Pancytopenia - Secondary to chemotherapy. Follow CBC in a.m. PRBC if hemoglobin <7 g per DL.  DVT ppx: on IV heparin Code Status: Full Family Communication: Discussed with patient's son in detail Mr. [name] on 6/20 Disposition Plan: DC home when medically stable.   Consultants:  Oncology/Dr. Burr Medico.  Procedures:  Bilateral lower extremity venous Dopplers 11/06/14: Summary:  - Findings consistent with subacute deep vein thrombosis involving the right common femoral vein, right femoral vein, and right popliteal vein. - Cannot exclude deep vein thrombosis involving the right posterial tibial vein and right peroneal vein. - No obvious evidence of deep vein thrombosis involving the left lower extremity. - Findings consistent with acute superficial vein thrombosis involving the left greater saphenous vein from mid calf to ankle.   Antibiotics:  IV vancomycin 6/18 >  IV Zosyn 6/18 > 6/20  Subjective: Continues to feel better. Decreased pain, redness and swelling of right lower  extremity. Denies pain in the left lower  extremity.  Objective: Filed Vitals:   11/08/14 0522 11/08/14 0900 11/08/14 1100 11/08/14 1422  BP: 151/73 138/70 157/90 124/59  Pulse: 65 78 76 70  Temp: 97.8 F (36.6 C)  98.5 F (36.9 C) 98 F (36.7 C)  TempSrc: Oral  Oral Oral  Resp: 20  18   Height:      Weight:      SpO2: 100%  99% 96%    Intake/Output Summary (Last 24 hours) at 11/08/14 1606 Last data filed at 11/08/14 0600  Gross per 24 hour  Intake 870.18 ml  Output      0 ml  Net 870.18 ml   Filed Weights   11/06/14 2138  Weight: 98.839 kg (217 lb 14.4 oz)     Exam:  General exam: Pleasant middle-aged female lying comfortably in bed Respiratory system: Clear. No increased work of breathing. Cardiovascular system: S1 & S2 heard, RRR. No JVD, murmurs, gallops, clicks or pedal edema.  Gastrointestinal system: Abdomen is nondistended, soft and nontender. Normal bowel sounds heard. Central nervous system: Alert and oriented. No focal neurological deficits. Extremities: Symmetric 5 x 5 power. Right leg findings have significantly improved. Now? Petechiae on right leg. Scabs of healing abrasions bilateral knees/legs. Trace bilateral leg edema   Data Reviewed: Basic Metabolic Panel:  Recent Labs Lab 11/02/14 0955 11/06/14 1548 11/07/14 0501  NA 140 132* 137  K 3.4* 4.5 4.0  CL  --  103 98*  CO2 29 26 28   GLUCOSE 100 452* 301*  BUN 13.6 20 13   CREATININE 0.8 0.78 0.66  CALCIUM 8.9 7.9* 8.5*   Liver Function Tests:  Recent Labs Lab 11/02/14 0955 11/07/14 0501  AST 158* 107*  ALT 351* 229*  ALKPHOS 255* 251*  BILITOT 1.01 1.5*  PROT 5.9* 6.3*  ALBUMIN 3.2* 3.5   No results for input(s): LIPASE, AMYLASE in the last 168 hours. No results for input(s): AMMONIA in the last 168 hours. CBC:  Recent Labs Lab 11/02/14 0956 11/06/14 1548 11/07/14 0501 11/08/14 0500  WBC 7.5 3.8* 4.5 2.8*  NEUTROABS 5.4 2.8  --   --   HGB 13.8 12.0 12.9 10.6*  HCT 42.4 36.7 40.7 34.3*  MCV 88.1 89.7 90.2 89.3   PLT 88* 46* 53* 34*   Cardiac Enzymes: No results for input(s): CKTOTAL, CKMB, CKMBINDEX, TROPONINI in the last 168 hours. BNP (last 3 results) No results for input(s): PROBNP in the last 8760 hours. CBG:  Recent Labs Lab 11/07/14 1151 11/07/14 1703 11/07/14 2138 11/08/14 0745 11/08/14 1151  GLUCAP 282* 257* 236* 148* 244*    Recent Results (from the past 240 hour(s))  Culture, blood (x 2)     Status: None (Preliminary result)   Collection Time: 11/06/14  9:50 PM  Result Value Ref Range Status   Specimen Description BLOOD RIGHT ARM  Final   Special Requests BOTTLES DRAWN AEROBIC AND ANAEROBIC 8CC  Final   Culture   Final    NO GROWTH 1 DAY Performed at [facility]    Report Status PENDING  Incomplete  Culture, blood (x 2)     Status: None (Preliminary result)   Collection Time: 11/06/14  9:54 PM  Result Value Ref Range Status   Specimen Description BLOOD LEFT HAND  Final   Special Requests BOTTLES DRAWN AEROBIC ONLY [place]  Final   Culture   Final    NO GROWTH 1 DAY Performed at [facility]  Report Status PENDING  Incomplete         Studies: No results found.      Scheduled Meds: . amLODipine  5 mg Oral q morning - 10a  . calcium-vitamin D  1 tablet Oral BID  . FLUoxetine  20 mg Oral q morning - 10a  . insulin aspart  0-20 Units Subcutaneous TID WC  . insulin aspart  0-5 Units Subcutaneous QHS  . insulin aspart  3 Units Subcutaneous TID WC  . insulin glargine  80 Units Subcutaneous QHS  . irbesartan  300 mg Oral Daily  . loratadine  10 mg Oral Daily  . multivitamin with minerals  1 tablet Oral Daily  . omega-3 acid ethyl esters  1 g Oral Daily  . piperacillin-tazobactam (ZOSYN)  IV  3.375 g Intravenous 3 times per day  . sodium chloride  3 mL Intravenous Q12H  . vancomycin  1,000 mg Intravenous Q12H   Continuous Infusions: . heparin 1,350 Units/hr (11/07/14 1928)    Principal Problem:   DVT (deep venous thrombosis) Active  Problems:   Anxiety state   Essential hypertension   Irritable bowel syndrome   Diabetes mellitus type 2, uncontrolled, without complications   Dilated aortic root   Bicuspid aortic valve   LVE (left ventricular enlargement)   Pancreatic cancer metastasized to liver   Right leg DVT   DVT (deep vein thrombosis) in pregnancy   Cellulitis of right leg    Time spent: 30 minutes.    Vernell Leep, MD, FACP, FHM. [facility] Pager [phone]  If 7PM-7AM, please contact night-coverage [email or web address] Password [email or web address] 11/08/2014, 4:06 PM    LOS: 2 days

## 2014-11-08 NOTE — Telephone Encounter (Signed)
Amber Bishop left VM that she is in hospital for DVT and cellulitis of her right leg. Dr. Burr Medico is aware. Appointments for tomorrow have been cancelled.

## 2014-11-08 NOTE — Evaluation (Signed)
Occupational Therapy Evaluation Patient Details Name: Amber Bishop MRN: [id number] DOB: [birth year] Today's Date: 11/08/2014    History of Present Illness Amber Bishop is a 64 y.o. female with PMH of hypertension, diabetes mellitus, depression, anxiety, IBS, arthritis, dilated aortic root, tricuspid aortic valve, pancreatic cancer metastasized to liver (doing chemotherapy currently), who presents with  RLE cellulitis, + DVT   Clinical Impression   Pt admitted with above.  She currently is doing very well with ADLs and functional mobility requiring supervision - mod I.   She does admit to fatigue with chemo and difficulty completing ADLs and IADLs at home.  Discussed energy conservation techniques, but pt demonstrates significant difficulty generalizing the information provided into her daily activities at home.  Do not feel she needs further acute OT, but feel she would benefit from [facility] to address home modifications and energy conservation to increase her safety and independence.     Follow Up Recommendations  Home health OT;Supervision - Intermittent    Equipment Recommendations  None recommended by OT    Recommendations for Other Services       Precautions / Restrictions Precautions Precautions: Fall Restrictions Weight Bearing Restrictions: No      Mobility Bed Mobility Overal bed mobility: Modified Independent                Transfers Overall transfer level: Modified independent                    Balance Overall balance assessment: History of Falls   Sitting balance-Leahy Scale: Good       Standing balance-Leahy Scale: Good                              ADL Overall ADL's : Needs assistance/impaired Eating/Feeding: Independent;Sitting   Grooming: Wash/dry hands;Wash/dry face;Oral care;Brushing hair;Supervision/safety;Standing   Upper Body Bathing: Set up;Sitting   Lower Body Bathing: Supervison/ safety;Sit to/from stand    Upper Body Dressing : Set up;Sitting   Lower Body Dressing: Supervision/safety;Sit to/from stand   Toilet Transfer: Set up;Ambulation;Comfort height toilet   Toileting- Clothing Manipulation and Hygiene: Set up;Sit to/from stand       Functional mobility during ADLs: Supervision/safety General ADL Comments: Pt reports she has difficulty with laundry due to fatigue and while on chemo, she frequently is so fatigued that she struggles to perform meal prep.  Discussed energy conservation techniques and use of a rolling cart to transport laundry (or use of a rolling suitcase), but when suggestions provided, pt contradicted herself stating she does just fine with her activities      Vision     Perception     Praxis      Pertinent Vitals/Pain Pain Assessment: No/denies pain     Hand Dominance     Extremity/Trunk Assessment Upper Extremity Assessment Upper Extremity Assessment: Generalized weakness   Lower Extremity Assessment Lower Extremity Assessment: Defer to PT evaluation   Cervical / Trunk Assessment Cervical / Trunk Assessment: Normal   Communication Communication Communication: No difficulties   Cognition Arousal/Alertness: Awake/alert Behavior During Therapy: WFL for tasks assessed/performed   Area of Impairment: Attention;Safety/judgement;Problem solving   Current Attention Level: Sustained     Safety/Judgement: Decreased awareness of safety   Problem Solving: Requires verbal cues General Comments: Pt easily self distracts and demonstrates difficulty staying on topc/task.  Will frequently contradict self when talking about PLOF    General Comments  Exercises       Shoulder Instructions      Home Living Family/patient expects to be discharged to:: Private residence Living Arrangements: Alone Available Help at Discharge: Family Type of Home: Apartment       Home Layout: One level     Bathroom Shower/Tub: Tub/shower  unit;Curtain Shower/tub characteristics: Architectural technologist: Standard     Home Equipment: None   Additional Comments: pt reports feeling easily fatigued d/t her cancer tx's; needs frequent rest breaks, has difficulty with laundry as she has to go to the [place] since her apt does not have w/d      Prior Functioning/Environment Level of Independence: Independent             OT Diagnosis: Generalized weakness;Cognitive deficits   OT Problem List: Decreased activity tolerance   OT Treatment/Interventions:      OT Goals(Current goals can be found in the care plan section) Acute Rehab OT Goals Patient Stated Goal: to increase her strength  OT Goal Formulation: All assessment and education complete, DC therapy  OT Frequency:     Barriers to D/C:            Co-evaluation              End of Session Nurse Communication: Mobility status  Activity Tolerance: Patient tolerated treatment well Patient left: in bed;with call bell/phone within reach;with family/visitor present   Time: 1255-1319 OT Time Calculation (min): 24 min Charges:  OT General Charges $OT Visit: 1 Procedure OT Evaluation $Initial OT Evaluation Tier I: 1 Procedure OT Treatments $Self Care/Home Management : 8-22 mins G-Codes:    Samay Delcarlo M 2014/12/08, 1:34 PM

## 2014-11-08 NOTE — Progress Notes (Signed)
ANTICOAGULATION CONSULT NOTE - follow-up  Pharmacy Consult for Heparin Indication: DVT  Allergies  Allergen Reactions  . Naprosyn [Naproxen] Nausea And Vomiting    Very nauseated but no vomiting.  . Metformin And Related Diarrhea    Pt has IBS     Patient Measurements: Height: 5\' 4"  (162.6 cm) Weight: 217 lb 14.4 oz (98.839 kg) IBW/kg (Calculated) : 54.7 Heparin Dosing Weight: 77 kg  Vital Signs: Temp: 98 F (36.7 C) (06/20 1422) Temp Source: Oral (06/20 1422) BP: 124/59 mmHg (06/20 1422) Pulse Rate: 70 (06/20 1422)  Labs:  Recent Labs  11/06/14 1548 11/06/14 2150  11/07/14 0501 11/07/14 1300 11/07/14 1824 11/08/14 0500  HGB 12.0  --   --  12.9  --   --  10.6*  HCT 36.7  --   --  40.7  --   --  34.3*  PLT 46*  --   --  53*  --   --  34*  APTT  --  28  --   --   --   --   --   LABPROT  --  14.3  --   --   --   --   --   INR  --  1.09  --   --   --   --   --   HEPARINUNFRC  --   --   < > 0.23* 0.34 0.40 0.42  CREATININE 0.78  --   --  0.66  --   --   --   < > = values in this interval not displayed.  Estimated Creatinine Clearance: 80 mL/min (by C-G formula based on Cr of 0.66).   Medical History: Past Medical History  Diagnosis Date  . Anxiety   . Hypertension   . IBS (irritable bowel syndrome)   . Hyperplastic colon polyp   . Allergy   . Anemia   . Arthritis   . Diverticulosis   . Dilated aortic root     seen on prior echo but echo 05/2013 showed normal dimensions  . Bicuspid aortic valve   . LVE (left ventricular enlargement)     mild by echo 1/15 with EF 50-55%  . pancreatic ca w/ liver mets dx'd 07/2014  . Diabetes mellitus without complication     Medications:  Scheduled:  . amLODipine  5 mg Oral q morning - 10a  . calcium-vitamin D  1 tablet Oral BID  . FLUoxetine  20 mg Oral q morning - 10a  . insulin aspart  0-20 Units Subcutaneous TID WC  . insulin aspart  0-5 Units Subcutaneous QHS  . insulin aspart  3 Units Subcutaneous TID WC  .  insulin glargine  80 Units Subcutaneous QHS  . irbesartan  300 mg Oral Daily  . loratadine  10 mg Oral Daily  . multivitamin with minerals  1 tablet Oral Daily  . omega-3 acid ethyl esters  1 g Oral Daily  . piperacillin-tazobactam (ZOSYN)  IV  3.375 g Intravenous 3 times per day  . sodium chloride  3 mL Intravenous Q12H  . vancomycin  1,000 mg Intravenous Q12H   Infusions:  . heparin 1,350 Units/hr (11/07/14 1928)    Assessment:  64 yr female with metastatic pancreatic cancer, undergoing chemotherapy presents with swelling of right lower leg.  Pt s/p fall 2 weeks ago.    Doppler = + DVT or right lower extremity  Pharmacy initially consulted to dose Lovenox for treatment of DVT.  Platelet count noted  to be 44.  Spoke with Dr Blaine Hamper and received orders to initiate anticoagulation with IV heparin (d/c lovenox order), which is easily reversible.   Today, 11/08/2014:  Heparin level = 0.42 (therapeutic) on heparin 1350 units/hr   CBC:  Hgb decreased to 10.6, Pltc dropped to 34  No bleeding issues currently reported  Goal of Therapy:  Heparin level 0.3-0.7 units/ml, preferably 0.3-0.5 with thrombocytopenia Monitor platelets by anticoagulation protocol: Yes   Plan:   Continue heparin gtt at 1350 units/hr  Follow heparin level & CBC daily.  Monitor closely for s/s of bleeding.  Per Oncology recs, okay to continue heparin drip for now (aware of thrombocytopenia). If platelet count does not drop further, plan is to bridge her over to warfarin.   Lindell Spar, PharmD, BCPS Pager: [phone] 11/08/2014 4:21 PM

## 2014-11-08 NOTE — Progress Notes (Signed)
Amber Bishop   DOB:08-09-1950   ZO#:[id number]   JWJ#:[id number]  Subjective:  Patient is well-known to me,  She is under my care for her metastatic pancreas cancer. She was admitted over the weekend for right lower extremity cellulitis and DVT.  She is currently on heparin drip and antibiotics. She feels better today.   Objective:  Filed Vitals:   11/08/14 1100  BP: 157/90  Pulse: 76  Temp: 98.5 F (36.9 C)  Resp: 18    Body mass index is 37.38 kg/(m^2).  Intake/Output Summary (Last 24 hours) at 11/08/14 1310 Last data filed at 11/08/14 0600  Gross per 24 hour  Intake 1110.18 ml  Output      0 ml  Net 1110.18 ml     Sclerae unicteric  Oropharynx clear  No peripheral adenopathy  Lungs clear -- no rales or rhonchi  Heart regular rate and rhythm  Abdomen benign  MSK no focal spinal tenderness, no peripheral edema  Neuro nonfocal  CBG (last 3)   Recent Labs  11/07/14 2138 11/08/14 0745 11/08/14 1151  GLUCAP 236* 148* 244*     Labs:  Lab Results  Component Value Date   WBC 2.8* 11/08/2014   HGB 10.6* 11/08/2014   HCT 34.3* 11/08/2014   MCV 89.3 11/08/2014   PLT 34* 11/08/2014   NEUTROABS 2.8 11/06/2014    @LASTCHEMISTRY @  Urine Studies No results for input(s): UHGB, CRYS in the last 72 hours.  Invalid input(s): UACOL, UAPR, USPG, UPH, UTP, UGL, UKET, UBIL, UNIT, UROB, ULEU, UEPI, UWBC, URBC, UBAC, CAST, [place], [place]  Basic Metabolic Panel:  Recent Labs Lab 11/02/14 0955 11/06/14 1548 11/07/14 0501  NA 140 132* 137  K 3.4* 4.5 4.0  CL  --  103 98*  CO2 29 26 28   GLUCOSE 100 452* 301*  BUN 13.6 20 13   CREATININE 0.8 0.78 0.66  CALCIUM 8.9 7.9* 8.5*   GFR Estimated Creatinine Clearance: 80 mL/min (by C-G formula based on Cr of 0.66). Liver Function Tests:  Recent Labs Lab 11/02/14 0955 11/07/14 0501  AST 158* 107*  ALT 351* 229*  ALKPHOS 255* 251*  BILITOT 1.01 1.5*  PROT 5.9* 6.3*  ALBUMIN 3.2* 3.5   No results for input(s): LIPASE,  AMYLASE in the last 168 hours. No results for input(s): AMMONIA in the last 168 hours. Coagulation profile  Recent Labs Lab 11/06/14 2150  INR 1.09    CBC:  Recent Labs Lab 11/02/14 0956 11/06/14 1548 11/07/14 0501 11/08/14 0500  WBC 7.5 3.8* 4.5 2.8*  NEUTROABS 5.4 2.8  --   --   HGB 13.8 12.0 12.9 10.6*  HCT 42.4 36.7 40.7 34.3*  MCV 88.1 89.7 90.2 89.3  PLT 88* 46* 53* 34*   Cardiac Enzymes: No results for input(s): CKTOTAL, CKMB, CKMBINDEX, TROPONINI in the last 168 hours. BNP: Invalid input(s): POCBNP CBG:  Recent Labs Lab 11/07/14 1151 11/07/14 1703 11/07/14 2138 11/08/14 0745 11/08/14 1151  GLUCAP 282* 257* 236* 148* 244*   D-Dimer No results for input(s): DDIMER in the last 72 hours. Hgb A1c No results for input(s): HGBA1C in the last 72 hours. Lipid Profile No results for input(s): CHOL, HDL, LDLCALC, TRIG, CHOLHDL, LDLDIRECT in the last 72 hours. Thyroid function studies No results for input(s): TSH, T4TOTAL, T3FREE, THYROIDAB in the last 72 hours.  Invalid input(s): FREET3 Anemia work up No results for input(s): VITAMINB12, FOLATE, FERRITIN, TIBC, IRON, RETICCTPCT in the last 72 hours. Microbiology No results found for this or any  previous visit (from the past 240 hour(s)).    Studies:  No results found.  Assessment: 64 y.o.  With stage IV diabetic cancer, under second line chemotherapy currently.  1.  Extensive  No occlusive Right lower extremity DVT 2.  Right lower extremity though it is 3.  Stage IV  pancreatic cancer with liver metastasis 4.   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 Pancytopenia from chemo    Plan:  - we discussed A/C options for her DVT, including Coumadin and Lovenox.  She is not very comfortable with injection,  She lives alone, but okay with Coumadin.  High risk of bleeding  Was discussed giving her  thrombocytopenia - if her plt does not drop further, please bridge her over to Coumadin.  Targeted the INR 2-2.5, giving the some cytopenia and high risk of bleeding - I have canceled her chemotherapy tomorrow - she will follow up with the Coumadin clinic in our cancer center - I'll see her  As needed during her hospital stay, and within one week after her discharge.   Truitt Merle, MD 11/08/2014  1:10 PM

## 2014-11-09 ENCOUNTER — Ambulatory Visit: Payer: PPO

## 2014-11-09 ENCOUNTER — Other Ambulatory Visit: Payer: PPO

## 2014-11-09 ENCOUNTER — Ambulatory Visit: Payer: PPO | Admitting: Hematology

## 2014-11-09 ENCOUNTER — Encounter: Payer: PPO | Admitting: Nutrition

## 2014-11-09 LAB — BASIC METABOLIC PANEL
Anion gap: 8 (ref 5–15)
BUN: 11 mg/dL (ref 6–20)
CHLORIDE: 102 mmol/L (ref 101–111)
CO2: 28 mmol/L (ref 22–32)
Calcium: 8.5 mg/dL — ABNORMAL LOW (ref 8.9–10.3)
Creatinine, Ser: 0.59 mg/dL (ref 0.44–1.00)
GFR calc Af Amer: >60 mL/min (ref >60)
GFR calc non Af Amer: >60 mL/min (ref >60)
GLUCOSE: 152 mg/dL — AB (ref 65–99)
POTASSIUM: 4 mmol/L (ref 3.5–5.1)
Sodium: 138 mmol/L (ref 135–145)

## 2014-11-09 LAB — CBC
HEMATOCRIT: 36.5 % (ref 36.0–46.0)
Hemoglobin: 11.8 g/dL — ABNORMAL LOW (ref 12.0–15.0)
MCH: 28.8 pg (ref 26.0–34.0)
MCHC: 32.3 g/dL (ref 30.0–36.0)
MCV: 89 fL (ref 78.0–100.0)
Platelets: 34 10*3/uL — ABNORMAL LOW (ref 150–400)
RBC: 4.1 MIL/uL (ref 3.87–5.11)
RDW: 16.3 % — AB (ref 11.5–15.5)
WBC: 2.3 10*3/uL — AB (ref 4.0–10.5)

## 2014-11-09 LAB — HEPARIN LEVEL (UNFRACTIONATED): Heparin Unfractionated: 0.46 IU/mL (ref 0.30–0.70)

## 2014-11-09 LAB — GLUCOSE, CAPILLARY
GLUCOSE-CAPILLARY: 114 mg/dL — AB (ref 65–99)
GLUCOSE-CAPILLARY: 186 mg/dL — AB (ref 65–99)
Glucose-Capillary: 159 mg/dL — ABNORMAL HIGH (ref 65–99)
Glucose-Capillary: 271 mg/dL — ABNORMAL HIGH (ref 65–99)

## 2014-11-09 MED ORDER — WARFARIN SODIUM 4 MG PO TABS
4.0000 mg | ORAL_TABLET | Freq: Once | ORAL | Status: DC
  Filled 2014-11-09: qty 1

## 2014-11-09 MED ORDER — WARFARIN - PHARMACIST DOSING INPATIENT
Freq: Every day | Status: DC
  Administered 2014-11-13 – 2014-11-14 (×2)

## 2014-11-09 MED ORDER — COUMADIN BOOK
Freq: Once | Status: AC
  Administered 2014-11-09: 1
  Filled 2014-11-09: qty 1

## 2014-11-09 MED ORDER — WARFARIN VIDEO: Freq: Once | Status: DC

## 2014-11-09 MED ORDER — WARFARIN SODIUM 3 MG PO TABS
3.0000 mg | ORAL_TABLET | Freq: Once | ORAL | Status: AC
  Administered 2014-11-09: 3 mg via ORAL
  Filled 2014-11-09: qty 1

## 2014-11-09 MED ORDER — DOXYCYCLINE HYCLATE 100 MG PO TABS
100.0000 mg | ORAL_TABLET | Freq: Two times a day (BID) | ORAL | Status: AC
  Administered 2014-11-09 – 2014-11-13 (×9): 100 mg via ORAL
  Filled 2014-11-09 (×9): qty 1

## 2014-11-09 NOTE — Progress Notes (Addendum)
Amber Bishop for Heparin, Warfarin Indication: DVT  Allergies  Allergen Reactions  . Naprosyn [Naproxen] Nausea And Vomiting    Very nauseated but no vomiting.  . Metformin And Related Diarrhea    Pt has IBS     Patient Measurements: Height: 5\' 4"  (162.6 cm) Weight: 217 lb 14.4 oz (98.839 kg) IBW/kg (Calculated) : 54.7 Heparin Dosing Weight: 77 kg  Vital Signs: Temp: 98 F (36.7 C) (06/21 1433) Temp Source: Oral (06/21 1433) BP: 147/69 mmHg (06/21 1433) Pulse Rate: 92 (06/21 1433)  Labs:  Recent Labs  11/06/14 1548 11/06/14 2150 11/07/14 0501  11/07/14 1824 11/08/14 0500 11/09/14 0414  HGB 12.0  --  12.9  --   --  10.6* 11.8*  HCT 36.7  --  40.7  --   --  34.3* 36.5  PLT 46*  --  53*  --   --  34* 34*  APTT  --  28  --   --   --   --   --   LABPROT  --  14.3  --   --   --   --   --   INR  --  1.09  --   --   --   --   --   HEPARINUNFRC  --   --  0.23*  < > 0.40 0.42 0.46  CREATININE 0.78  --  0.66  --   --   --  0.59  < > = values in this interval not displayed.  Estimated Creatinine Clearance: 80 mL/min (by C-G formula based on Cr of 0.59).   Medical History: Past Medical History  Diagnosis Date  . Anxiety   . Hypertension   . IBS (irritable bowel syndrome)   . Hyperplastic colon polyp   . Allergy   . Anemia   . Arthritis   . Diverticulosis   . Dilated aortic root     seen on prior echo but echo 05/2013 showed normal dimensions  . Bicuspid aortic valve   . LVE (left ventricular enlargement)     mild by echo 1/15 with EF 50-55%  . pancreatic ca w/ liver mets dx'd 07/2014  . Diabetes mellitus without complication     Medications:  Scheduled:  . amLODipine  5 mg Oral q morning - 10a  . calcium-vitamin D  1 tablet Oral BID  . doxycycline  100 mg Oral Q12H  . FLUoxetine  20 mg Oral q morning - 10a  . insulin aspart  0-20 Units Subcutaneous TID WC  . insulin aspart  0-5 Units Subcutaneous QHS  . insulin aspart  6  Units Subcutaneous TID WC  . insulin glargine  80 Units Subcutaneous QHS  . irbesartan  300 mg Oral Daily  . loratadine  10 mg Oral Daily  . multivitamin with minerals  1 tablet Oral Daily  . omega-3 acid ethyl esters  1 g Oral Daily  . sodium chloride  3 mL Intravenous Q12H   Infusions:  . heparin 1,350 Units/hr (11/09/14 1120)    Assessment:  64 yr female with metastatic pancreatic cancer, undergoing chemotherapy presents with swelling of right lower leg.  Pt s/p fall 2 weeks ago.    Doppler = + DVT or right lower extremity  Pharmacy initially consulted to dose Lovenox for treatment of DVT.  Platelet count noted to be 46.  RPh spoke with Dr Blaine Hamper and received orders to initiate anticoagulation with IV heparin (d/c lovenox order), which is  easily reversible.   Today, 11/09/2014:  Heparin level = 0.46 (therapeutic) on heparin 1350 units/hr   CBC:  Hgb stable at 11.8, Pltc remains 34  Petechiae noted on R leg per MD.   No active bleeding issues currently reported per nursing.  Discussed with Dr. Burr Medico (Oncology)-recommends bridging over to warfarin today since platelet count has not dropped further. She recommends lower INR goal of 2-2.5. She recommends targeting lower heparin level goal of 0.3-0.5 units/mL (heparin level has been within that range over past 2 days). Discussed with Dr. Algis Liming [facility]) as well, who recommends starting warfarin per pharmacy consult today as per Oncology guidance.   Baseline PT/INR 6/18: 14.3/1.09  LFTs elevated 6/19: AST/ALT 107/229, Alk Phos 251, Tbili 1.5  Drug Interactions: Doxycycline, Fluoxetine  Diet: Carb-modified  Goal of Therapy:  Heparin level 0.3-0.5 units/mL INR 2-2.5 Monitor platelets by anticoagulation protocol: Yes   Plan:   Continue heparin gtt at 1350 units/hr  Daily heparin level & CBC.  Warfarin 3 mg PO x 1 today, more conservative dose in setting of thrombocytopenia, elevated liver enzymes, DDIs.  Daily  PT/NIR.  Monitor closely for s/s of bleeding.  Pharmacy to provider warfarin education prior to discharge.   Lindell Spar, PharmD, BCPS Pager: [phone] 11/09/2014 3:37 PM

## 2014-11-09 NOTE — Progress Notes (Signed)
Physical Therapy Treatment Patient Details Name: Amber Bishop MRN: [id number] DOB: 04-Feb-1951 Today's Date: Nov 10, 2014    History of Present Illness Amber Bishop is a 64 y.o. female with PMH of hypertension, diabetes mellitus, depression, anxiety, IBS, arthritis, dilated aortic root, tricuspid aortic valve, pancreatic cancer metastasized to liver (doing chemotherapy currently), who presents with  RLE cellulitis, + DVT    PT Comments    Pt ambulated good distance in hallway and making good progress with mobility.  Follow Up Recommendations  Home health PT     Equipment Recommendations  None recommended by PT    Recommendations for Other Services       Precautions / Restrictions Precautions Precautions: Fall    Mobility  Bed Mobility Overal bed mobility: Modified Independent                Transfers Overall transfer level: Modified independent                  Ambulation/Gait Ambulation/Gait assistance: Supervision Ambulation Distance (Feet): 240 Feet Assistive device: None Gait Pattern/deviations: Drifts right/left;Step-through pattern     General Gait Details: pt pushed IV pole, reports feeling good ambulating   Stairs            Wheelchair Mobility    Modified Rankin (Stroke Patients Only)       Balance                                    Cognition Arousal/Alertness: Awake/alert Behavior During Therapy: WFL for tasks assessed/performed   Area of Impairment: Attention;Safety/judgement;Problem solving   Current Attention Level: Sustained     Safety/Judgement: Decreased awareness of safety   Problem Solving: Requires verbal cues General Comments: Pt easily self distracts and demonstrates difficulty staying on topic/task.      Exercises      General Comments        Pertinent Vitals/Pain Pain Assessment: No/denies pain    Home Living                      Prior Function            PT  Goals (current goals can now be found in the care plan section) Progress towards PT goals: Progressing toward goals    Frequency  Min 3X/week    PT Plan Current plan remains appropriate    Co-evaluation             End of Session Equipment Utilized During Treatment: Gait belt Activity Tolerance: Patient tolerated treatment well Patient left: in bed;with call bell/phone within reach     Time: 1124-1140 PT Time Calculation (min) (ACUTE ONLY): 16 min  Charges:  $Gait Training: 8-22 mins                    G Codes:      Sidharth Leverette,KATHrine E November 10, 2014, 1:50 PM Carmelia Bake, PT, DPT November 10, 2014 Pager: [phone]

## 2014-11-09 NOTE — Progress Notes (Signed)
PROGRESS NOTE    Amber Bishop VZC:[id number] DOB: 21-Jun-1950 DOA: 11/06/2014 PCP: [name], MD  HPI/Brief narrative 64 y.o. female with PMH of hypertension, diabetes mellitus, depression, anxiety, IBS, arthritis, dilated aortic root, tricuspid aortic valve, pancreatic cancer metastasized to liver (doing chemotherapy currently), who presents with RLE cellulitis. In ED, patient was found to have WBC 3.8, platelet 46, temperature normal, no tachycardia, electrolytes okay. LE venous Doppler revealed right lower extremity DVT and left lower extremity SVT. Patient is admitted to inpatient for further evaluation and treatment.   Assessment/Plan:  Right leg DVT:  - Patient has right leg DVT as evidenced by venous Doppler No signs of PE.  - Patient was empirically started on IV heparin drip due to ease of discontinuation if her thrombocytopenia got worse. Presented with platelet count of 46. - Oncology Dr. Burr Medico consulted: She discussed extensively with patient and recommends Coumadin bridging with target INR 2-2.5 - Platelet counts in the mid 30s for the last 2 days. Pharmacist has discussed with Dr. Burr Medico who is recommended starting Coumadin along with IV heparin bridging - Monitor closely.  Right leg cellulitis:  - Initially treated empirically with IV vancomycin and Zosyn given immunocompromise status - Blood cultures x 2: Negative to date - Cellulitis findings have now resolved. Current picture is suggestive of purpuric rash from thrombocytopenia. - DC IV antibiotics and started oral doxycycline to complete total 7 days treatment.  Depression and anxiety:  - Stable, no suicidal or homicidal ideations. -Continue home medications: When necessary Xanax, Prozac  Essential hypertension: -Switch Diovan-HCTZ to irbesartan -Continue amlodipine -IV hydralazine when necessary - Reasonably controlled  DM-II:  - Last A1c 10.1 on 05/05/14, poorly controled.  - Patient is taking Lantus  74 units daily at home. Increased Lantus dose from 74 to 80 units daily due to CBGs in the 400s on admission. Adjust sliding scale and add mealtime NovoLog -SSI-moderate - Improved control.  Stage IV Pancreatic cancer metastasized to liver:  - diagnosed on 07/2014. Followed up with Dr. Burr Medico. She is currently receiving chemotherapy, completed 4 cycles so far.  -Oncologist input appreciated. Chemotherapy scheduled for 6/21 has been canceled due to her cytopenias. -Abnormal LFTs likely due to this.   Thrombocytopenia/pancytopenia:  - DD: ITP, hypersplenism and chemotherapy -CBC daily while on Heparin gtt - Platelet count have fluctuated from 46 > 53 > 34 >34. No overt bleeding except for right leg purpura. Follow CBCs while on anticoagulation. Transfuse platelets if active bleeding or if counts drop? Less than 20,000.  Pancytopenia - Secondary to chemotherapy. Follow CBC in a.m. PRBC if hemoglobin <7 g per DL. Hemoglobin stable.  DVT ppx: on IV heparin Code Status: Full Family Communication: Discussed with patient's son in detail Mr. [name] on 6/20 Disposition Plan: DC home when medically stable.   Consultants:  Oncology/Dr. Burr Medico.  Procedures:  Bilateral lower extremity venous Dopplers 11/06/14: Summary:  - Findings consistent with subacute deep vein thrombosis involving the right common femoral vein, right femoral vein, and right popliteal vein. - Cannot exclude deep vein thrombosis involving the right posterial tibial vein and right peroneal vein. - No obvious evidence of deep vein thrombosis involving the left lower extremity. - Findings consistent with acute superficial vein thrombosis involving the left greater saphenous vein from mid calf to ankle.   Antibiotics:  IV vancomycin 6/18 > 6/21  IV Zosyn 6/18 > 6/20  Oral doxycycline 6/21 >  Subjective: No new complaints. No reported bleeding.  Objective: Filed Vitals:  11/08/14 1422 11/08/14  2031 11/09/14 0620 11/09/14 1433  BP: 124/59 136/77 125/65 147/69  Pulse: 70 80 75 92  Temp: 98 F (36.7 C) 98.7 F (37.1 C) 98.2 F (36.8 C) 98 F (36.7 C)  TempSrc: Oral Oral Oral Oral  Resp:  20 18 16   Height:      Weight:      SpO2: 96% 99% 98% 98%    Intake/Output Summary (Last 24 hours) at 11/09/14 1635 Last data filed at 11/09/14 0700  Gross per 24 hour  Intake    203 ml  Output      0 ml  Net    203 ml   Filed Weights   11/06/14 2138  Weight: 98.839 kg (217 lb 14.4 oz)     Exam:  General exam: Pleasant middle-aged female lying comfortably in bed Respiratory system: Clear. No increased work of breathing. Cardiovascular system: S1 & S2 heard, RRR. No JVD, murmurs, gallops, clicks or pedal edema.  Gastrointestinal system: Abdomen is nondistended, soft and nontender. Normal bowel sounds heard. Central nervous system: Alert and oriented. No focal neurological deficits. Extremities: Symmetric 5 x 5 power. Right leg findings have significantly improved now suggestive of petechiae/purpurae. Scabs of healing abrasions bilateral knees/legs. Trace bilateral leg edema   Data Reviewed: Basic Metabolic Panel:  Recent Labs Lab 11/06/14 1548 11/07/14 0501 11/09/14 0414  NA 132* 137 138  K 4.5 4.0 4.0  CL 103 98* 102  CO2 26 28 28   GLUCOSE 452* 301* 152*  BUN 20 13 11   CREATININE 0.78 0.66 0.59  CALCIUM 7.9* 8.5* 8.5*   Liver Function Tests:  Recent Labs Lab 11/07/14 0501  AST 107*  ALT 229*  ALKPHOS 251*  BILITOT 1.5*  PROT 6.3*  ALBUMIN 3.5   No results for input(s): LIPASE, AMYLASE in the last 168 hours. No results for input(s): AMMONIA in the last 168 hours. CBC:  Recent Labs Lab 11/06/14 1548 11/07/14 0501 11/08/14 0500 11/09/14 0414  WBC 3.8* 4.5 2.8* 2.3*  NEUTROABS 2.8  --   --   --   HGB 12.0 12.9 10.6* 11.8*  HCT 36.7 40.7 34.3* 36.5  MCV 89.7 90.2 89.3 89.0  PLT 46* 53* 34* 34*   Cardiac Enzymes: No results for input(s): CKTOTAL,  CKMB, CKMBINDEX, TROPONINI in the last 168 hours. BNP (last 3 results) No results for input(s): PROBNP in the last 8760 hours. CBG:  Recent Labs Lab 11/08/14 1151 11/08/14 1658 11/08/14 2207 11/09/14 0751 11/09/14 1130  GLUCAP 244* 242* 165* 114* 159*    Recent Results (from the past 240 hour(s))  Culture, blood (x 2)     Status: None (Preliminary result)   Collection Time: 11/06/14  9:50 PM  Result Value Ref Range Status   Specimen Description BLOOD RIGHT ARM  Final   Special Requests BOTTLES DRAWN AEROBIC AND ANAEROBIC 8CC  Final   Culture   Final    NO GROWTH 2 DAYS Performed at [facility]    Report Status PENDING  Incomplete  Culture, blood (x 2)     Status: None (Preliminary result)   Collection Time: 11/06/14  9:54 PM  Result Value Ref Range Status   Specimen Description BLOOD LEFT HAND  Final   Special Requests BOTTLES DRAWN AEROBIC ONLY [place]  Final   Culture   Final    NO GROWTH 2 DAYS Performed at [facility]    Report Status PENDING  Incomplete  Studies: No results found.      Scheduled Meds: . amLODipine  5 mg Oral q morning - 10a  . calcium-vitamin D  1 tablet Oral BID  . coumadin book   Does not apply Once  . doxycycline  100 mg Oral Q12H  . FLUoxetine  20 mg Oral q morning - 10a  . insulin aspart  0-20 Units Subcutaneous TID WC  . insulin aspart  0-5 Units Subcutaneous QHS  . insulin aspart  6 Units Subcutaneous TID WC  . insulin glargine  80 Units Subcutaneous QHS  . irbesartan  300 mg Oral Daily  . loratadine  10 mg Oral Daily  . multivitamin with minerals  1 tablet Oral Daily  . omega-3 acid ethyl esters  1 g Oral Daily  . sodium chloride  3 mL Intravenous Q12H  . warfarin  3 mg Oral ONCE-1800  . warfarin   Does not apply Once  . Warfarin - Pharmacist Dosing Inpatient   Does not apply q1800   Continuous Infusions: . heparin 1,350 Units/hr (11/09/14 1120)    Principal Problem:   DVT (deep venous  thrombosis) Active Problems:   Anxiety state   Essential hypertension   Irritable bowel syndrome   Diabetes mellitus type 2, uncontrolled, without complications   Dilated aortic root   Bicuspid aortic valve   LVE (left ventricular enlargement)   Pancreatic cancer metastasized to liver   Right leg DVT   DVT (deep vein thrombosis) in pregnancy   Cellulitis of right leg   Antineoplastic chemotherapy induced pancytopenia    Time spent: 30 minutes.    Vernell Leep, MD, FACP, FHM. [facility] Pager [phone]  If 7PM-7AM, please contact night-coverage [email or web address] Password [facility] 11/09/2014, 4:35 PM    LOS: 3 days

## 2014-11-09 NOTE — Progress Notes (Signed)
Amber Bishop   DOB:Jul 15, 1950   WN#:[id number]   QIH#:[id number]  Subjective:  Amber Bishop is tolerating heparin drip, no bleeding. Her plt is stable today at 34K. Right LE still quite erythematous, no fever.      Objective:  Filed Vitals:   11/09/14 1433  BP: 147/69  Pulse: 92  Temp: 98 F (36.7 C)  Resp: 16    Body mass index is 37.38 kg/(m^2).  Intake/Output Summary (Last 24 hours) at 11/09/14 2059 Last data filed at 11/09/14 0700  Gross per 24 hour  Intake    203 ml  Output      0 ml  Net    203 ml     Sclerae unicteric  Oropharynx clear  No peripheral adenopathy  Lungs clear -- no rales or rhonchi  Heart regular rate and rhythm  Abdomen benign  MSK no focal spinal tenderness, no peripheral edema  Neuro nonfocal  Skin: (+) Petechia on both lower extremities, more on the right. (+) Diffuse skin erythema on right lower extremity below knee  CBG (last 3)   Recent Labs  11/09/14 0751 11/09/14 1130 11/09/14 1710  GLUCAP 114* 159* 186*     Labs:  Lab Results  Component Value Date   WBC 2.3* 11/09/2014   HGB 11.8* 11/09/2014   HCT 36.5 11/09/2014   MCV 89.0 11/09/2014   PLT 34* 11/09/2014   NEUTROABS 2.8 11/06/2014    @LASTCHEMISTRY @  Urine Studies No results for input(s): UHGB, CRYS in the last 72 hours.  Invalid input(s): UACOL, UAPR, USPG, UPH, UTP, UGL, UKET, UBIL, UNIT, UROB, ULEU, UEPI, UWBC, URBC, UBAC, CAST, [place], [place]  Basic Metabolic Panel:  Recent Labs Lab 11/06/14 1548 11/07/14 0501 11/09/14 0414  NA 132* 137 138  K 4.5 4.0 4.0  CL 103 98* 102  CO2 26 28 28   GLUCOSE 452* 301* 152*  BUN 20 13 11   CREATININE 0.78 0.66 0.59  CALCIUM 7.9* 8.5* 8.5*   GFR Estimated Creatinine Clearance: 80 mL/min (by C-G formula based on Cr of 0.59). Liver Function Tests:  Recent Labs Lab 11/07/14 0501  AST 107*  ALT 229*  ALKPHOS 251*  BILITOT 1.5*  PROT 6.3*  ALBUMIN 3.5   No results for input(s): LIPASE, AMYLASE in the last 168 hours. No  results for input(s): AMMONIA in the last 168 hours. Coagulation profile  Recent Labs Lab 11/06/14 2150  INR 1.09    CBC:  Recent Labs Lab 11/06/14 1548 11/07/14 0501 11/08/14 0500 11/09/14 0414  WBC 3.8* 4.5 2.8* 2.3*  NEUTROABS 2.8  --   --   --   HGB 12.0 12.9 10.6* 11.8*  HCT 36.7 40.7 34.3* 36.5  MCV 89.7 90.2 89.3 89.0  PLT 46* 53* 34* 34*   Cardiac Enzymes: No results for input(s): CKTOTAL, CKMB, CKMBINDEX, TROPONINI in the last 168 hours. BNP: Invalid input(s): POCBNP CBG:  Recent Labs Lab 11/08/14 1658 11/08/14 2207 11/09/14 0751 11/09/14 1130 11/09/14 1710  GLUCAP 242* 165* 114* 159* 186*   D-Dimer No results for input(s): DDIMER in the last 72 hours. Hgb A1c No results for input(s): HGBA1C in the last 72 hours. Lipid Profile No results for input(s): CHOL, HDL, LDLCALC, TRIG, CHOLHDL, LDLDIRECT in the last 72 hours. Thyroid function studies No results for input(s): TSH, T4TOTAL, T3FREE, THYROIDAB in the last 72 hours.  Invalid input(s): FREET3 Anemia work up No results for input(s): VITAMINB12, FOLATE, FERRITIN, TIBC, IRON, RETICCTPCT in the last 72 hours. Microbiology Recent Results (from the past  240 hour(s))  Culture, blood (x 2)     Status: None (Preliminary result)   Collection Time: 11/06/14  9:50 PM  Result Value Ref Range Status   Specimen Description BLOOD RIGHT ARM  Final   Special Requests BOTTLES DRAWN AEROBIC AND ANAEROBIC 8CC  Final   Culture   Final    NO GROWTH 2 DAYS Performed at [facility]    Report Status PENDING  Incomplete  Culture, blood (x 2)     Status: None (Preliminary result)   Collection Time: 11/06/14  9:54 PM  Result Value Ref Range Status   Specimen Description BLOOD LEFT HAND  Final   Special Requests BOTTLES DRAWN AEROBIC ONLY [place]  Final   Culture   Final    NO GROWTH 2 DAYS Performed at [facility]    Report Status PENDING  Incomplete      Studies:  No results  found.  Assessment: 64 y.o.  With stage IV diabetic cancer, under second line chemotherapy currently.  1.  Extensive  No occlusive Right lower extremity DVT 2.  Right lower extremity cellulitis .  3.  Stage IV  pancreatic cancer with liver metastasis 4.   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 Pancytopenia from chemo  6. Abnormal liver function, likely secondary to liver mets     Plan:  -Her plt is stable today, will start coumadin today, discussed with pharmacy and Dr. Algis Liming -targeting INR 2-3, and aptt in low therapeutic range when on heparin, risk of bleeding is high due to thrombocytopenia  -her right LE skin erythema are also related to  Petechia and DVT, in addition to cellulitis.  I agree with transitioning her to oral antibiotics. -overall prognosis is poor due to her metastatic cancer and she is probably no longer able to tolerated full dose chemo due to significant thrombocytopenia  -may consider Nplate if her thrombocytopenia does not improve.   Will follow.    Truitt Merle, MD 11/09/2014  8:59 PM

## 2014-11-10 LAB — CBC
HEMATOCRIT: 38.3 % (ref 36.0–46.0)
Hemoglobin: 12.5 g/dL (ref 12.0–15.0)
MCH: 28.7 pg (ref 26.0–34.0)
MCHC: 32.6 g/dL (ref 30.0–36.0)
MCV: 88 fL (ref 78.0–100.0)
Platelets: 44 10*3/uL — ABNORMAL LOW (ref 150–400)
RBC: 4.35 MIL/uL (ref 3.87–5.11)
RDW: 16.4 % — AB (ref 11.5–15.5)
WBC: 3.1 10*3/uL — AB (ref 4.0–10.5)

## 2014-11-10 LAB — GLUCOSE, CAPILLARY
GLUCOSE-CAPILLARY: 140 mg/dL — AB (ref 65–99)
Glucose-Capillary: 226 mg/dL — ABNORMAL HIGH (ref 65–99)
Glucose-Capillary: 241 mg/dL — ABNORMAL HIGH (ref 65–99)
Glucose-Capillary: 202 mg/dL — ABNORMAL HIGH (ref 65–99)

## 2014-11-10 LAB — PROTIME-INR
INR: 1.05 (ref 0.00–1.49)
PROTHROMBIN TIME: 13.9 s (ref 11.6–15.2)

## 2014-11-10 LAB — HEPARIN LEVEL (UNFRACTIONATED): Heparin Unfractionated: 0.48 IU/mL (ref 0.30–0.70)

## 2014-11-10 MED ORDER — WARFARIN SODIUM 3 MG PO TABS
3.0000 mg | ORAL_TABLET | Freq: Once | ORAL | Status: AC
  Administered 2014-11-10: 3 mg via ORAL
  Filled 2014-11-10: qty 1

## 2014-11-10 NOTE — Care Management Note (Signed)
Case Management Note  Patient Details  Name: Amber Bishop MRN: [id number] Date of Birth: Apr 22, 1951  Subjective/Objective:DVT                    Action/Plan:from home plan to return. Pt declined HH.   Expected Discharge Date:                  Expected Discharge Plan:  Home/Self Care  In-House Referral:     Discharge planning Services  CM Consult  Post Acute Care Choice:    Choice offered to:  Patient  DME Arranged:    DME Agency:     HH Arranged:    [place]:     Status of Service:     Medicare Important Message Given:  Yes Date Medicare IM Given:  11/10/14 Medicare IM give by:  M.Alekxander Isola, RN Date Additional Medicare IM Given:    Additional Medicare Important Message give by:     If discussed at [place] of Stay Meetings, dates discussed:    Additional CommentsPurcell Mouton, RN 11/10/2014, 4:02 PM

## 2014-11-10 NOTE — Progress Notes (Signed)
Resumed care of patient. Agree with previous assessment. Will continue to monitor. 

## 2014-11-10 NOTE — Progress Notes (Signed)
Inpatient Diabetes Program Recommendations  AACE/ADA: New Consensus Statement on Inpatient Glycemic Control (2013)  Target Ranges:  Prepandial:   less than 140 mg/dL      Peak postprandial:   less than 180 mg/dL (1-2 hours)      Critically ill patients:  140 - 180 mg/dL   Reason for Visit: Hyperglycemia  Results for Amber Bishop, Amber Bishop (MRN [id number]) as of 11/10/2014 16:08  Ref. Range 11/09/2014 21:38 11/10/2014 07:20 11/10/2014 11:27  Glucose-Capillary Latest Ref Range: 65-99 mg/dL 271 (H) 140 (H) 226 (H)   Post-prandial blood sugars elevated.  Consider increasing Novolog to 6 units tidwc for meal coverage insulin.  Thank you. Lorenda Peck, RD, LDN, CDE Inpatient Diabetes Coordinator [phone]

## 2014-11-10 NOTE — Progress Notes (Signed)
PROGRESS NOTE    Amber Bishop [id number]:[id number] DOB: 19-Oct-1950 DOA: 11/06/2014 PCP: [name], MD  HPI/Brief narrative 64 y.o. female with PMH of hypertension, diabetes mellitus, depression, anxiety, IBS, arthritis, dilated aortic root, tricuspid aortic valve, pancreatic cancer metastasized to liver (doing chemotherapy currently), who presents with RLE cellulitis. In ED, patient was found to have WBC 3.8, platelet 46, temperature normal, no tachycardia, electrolytes okay. LE venous Doppler revealed right lower extremity DVT and left lower extremity SVT. Patient is admitted to inpatient for further evaluation and treatment.   Assessment/Plan:  Right leg DVT:  - Patient has right leg DVT as evidenced by venous Doppler No signs of PE.  - Patient was empirically started on IV heparin drip due to ease of discontinuation if her thrombocytopenia got worse. Presented with platelet count of 46. - Oncology Dr. Burr Medico consulted: She discussed extensively with patient and recommends Coumadin bridging with target INR 2-2.5 - Platelet counts in the mid 30s for the last 2 days.  - Started Coumadin (6/21) along with IV heparin bridging - Monitor closely.  Right leg cellulitis:  - Initially treated empirically with IV vancomycin and Zosyn given immunocompromise status - Blood cultures x 2: Negative to date - Cellulitis findings have now resolved. Current picture is suggestive of purpuric rash from thrombocytopenia. - DC'ed IV antibiotics and started oral doxycycline to complete total 7 days treatment.  Depression and anxiety:  - Stable, no suicidal or homicidal ideations. -Continue home medications: When necessary Xanax, Prozac  Essential hypertension: -Switch Diovan-HCTZ to irbesartan -Continue amlodipine -IV hydralazine when necessary - Reasonably controlled  DM-II:  - Last A1c 10.1 on 05/05/14, poorly controled.  - Patient is taking Lantus 74 units daily at home. Increased Lantus dose  from 74 to 80 units daily due to CBGs in the 400s on admission. Adjust sliding scale and add mealtime NovoLog -SSI-moderate - Improved control.  Stage IV Pancreatic cancer metastasized to liver:  - diagnosed on 07/2014. Followed up with Dr. Burr Medico. She is currently receiving chemotherapy, completed 4 cycles so far.  -Oncologist input appreciated. Chemotherapy scheduled for 6/21 has been canceled due to her cytopenias. -Abnormal LFTs likely due to this.   Thrombocytopenia/pancytopenia:  - DD: ITP, hypersplenism and chemotherapy -CBC daily while on Heparin gtt - Platelet count have fluctuated from 46 > 53 > 34 >34>44. No overt bleeding except for right leg purpura. Follow CBCs while on anticoagulation. Transfuse platelets if active bleeding or if counts drop? Less than 20,000.  Pancytopenia - Secondary to chemotherapy. Follow CBC in a.m. PRBC if hemoglobin <7 g per DL. Hemoglobin stable.  DVT ppx: on IV heparin + Coumadin. Code Status: Full Family Communication: Discussed with patient's son in detail Mr. [name] on 6/20 Disposition Plan: DC home when medically stable.   Consultants:  Oncology/Dr. Burr Medico.  Procedures:  Bilateral lower extremity venous Dopplers 11/06/14: Summary:  - Findings consistent with subacute deep vein thrombosis involving the right common femoral vein, right femoral vein, and right popliteal vein. - Cannot exclude deep vein thrombosis involving the right posterial tibial vein and right peroneal vein. - No obvious evidence of deep vein thrombosis involving the left lower extremity. - Findings consistent with acute superficial vein thrombosis involving the left greater saphenous vein from mid calf to ankle.   Antibiotics:  IV vancomycin 6/18 > 6/21  IV Zosyn 6/18 > 6/20  Oral doxycycline 6/21 >  Subjective: No new complaints.  RLE pain decreased. No reported bleeding.  Objective: Filed Vitals:  11/09/14 1433 11/09/14 2134 11/10/14  0443 11/10/14 1353  BP: 147/69 134/81 135/86 157/84  Pulse: 92 84 85 90  Temp: 98 F (36.7 C) 98.2 F (36.8 C) 98 F (36.7 C) 98 F (36.7 C)  TempSrc: Oral Oral Oral Oral  Resp: 16 18 18    Height:      Weight:      SpO2: 98% 100% 99% 100%    Intake/Output Summary (Last 24 hours) at 11/10/14 1718 Last data filed at 11/10/14 0700  Gross per 24 hour  Intake    165 ml  Output      0 ml  Net    165 ml   Filed Weights   11/06/14 2138  Weight: 98.839 kg (217 lb 14.4 oz)     Exam:  General exam: Pleasant middle-aged female lying comfortably in bed Respiratory system: Clear. No increased work of breathing. Cardiovascular system: S1 & S2 heard, RRR. No JVD, murmurs, gallops, clicks or pedal edema.  Gastrointestinal system: Abdomen is nondistended, soft and nontender. Normal bowel sounds heard. Central nervous system: Alert and oriented. No focal neurological deficits. Extremities: Symmetric 5 x 5 power. Right leg findings have significantly improved now suggestive of petechiae/purpurae. Scabs of healing abrasions bilateral knees/legs. Trace bilateral leg edema   Data Reviewed: Basic Metabolic Panel:  Recent Labs Lab 11/06/14 1548 11/07/14 0501 11/09/14 0414  NA 132* 137 138  K 4.5 4.0 4.0  CL 103 98* 102  CO2 26 28 28   GLUCOSE 452* 301* 152*  BUN 20 13 11   CREATININE 0.78 0.66 0.59  CALCIUM 7.9* 8.5* 8.5*   Liver Function Tests:  Recent Labs Lab 11/07/14 0501  AST 107*  ALT 229*  ALKPHOS 251*  BILITOT 1.5*  PROT 6.3*  ALBUMIN 3.5   No results for input(s): LIPASE, AMYLASE in the last 168 hours. No results for input(s): AMMONIA in the last 168 hours. CBC:  Recent Labs Lab 11/06/14 1548 11/07/14 0501 11/08/14 0500 11/09/14 0414 11/10/14 0442  WBC 3.8* 4.5 2.8* 2.3* 3.1*  NEUTROABS 2.8  --   --   --   --   HGB 12.0 12.9 10.6* 11.8* 12.5  HCT 36.7 40.7 34.3* 36.5 38.3  MCV 89.7 90.2 89.3 89.0 88.0  PLT 46* 53* 34* 34* 44*   Cardiac Enzymes: No  results for input(s): CKTOTAL, CKMB, CKMBINDEX, TROPONINI in the last 168 hours. BNP (last 3 results) No results for input(s): PROBNP in the last 8760 hours. CBG:  Recent Labs Lab 11/09/14 1130 11/09/14 1710 11/09/14 2138 11/10/14 0720 11/10/14 1127  GLUCAP 159* 186* 271* 140* 226*    Recent Results (from the past 240 hour(s))  Culture, blood (x 2)     Status: None (Preliminary result)   Collection Time: 11/06/14  9:50 PM  Result Value Ref Range Status   Specimen Description BLOOD RIGHT ARM  Final   Special Requests BOTTLES DRAWN AEROBIC AND ANAEROBIC 8CC  Final   Culture   Final    NO GROWTH 3 DAYS Performed at [facility]    Report Status PENDING  Incomplete  Culture, blood (x 2)     Status: None (Preliminary result)   Collection Time: 11/06/14  9:54 PM  Result Value Ref Range Status   Specimen Description BLOOD LEFT HAND  Final   Special Requests BOTTLES DRAWN AEROBIC ONLY [place]  Final   Culture   Final    NO GROWTH 3 DAYS Performed at [facility]    Report Status PENDING  Incomplete         Studies: No results found.      Scheduled Meds: . amLODipine  5 mg Oral q morning - 10a  . calcium-vitamin D  1 tablet Oral BID  . doxycycline  100 mg Oral Q12H  . FLUoxetine  20 mg Oral q morning - 10a  . insulin aspart  0-20 Units Subcutaneous TID WC  . insulin aspart  0-5 Units Subcutaneous QHS  . insulin aspart  6 Units Subcutaneous TID WC  . insulin glargine  80 Units Subcutaneous QHS  . irbesartan  300 mg Oral Daily  . loratadine  10 mg Oral Daily  . multivitamin with minerals  1 tablet Oral Daily  . omega-3 acid ethyl esters  1 g Oral Daily  . sodium chloride  3 mL Intravenous Q12H  . warfarin  3 mg Oral ONCE-1800  . warfarin   Does not apply Once  . Warfarin - Pharmacist Dosing Inpatient   Does not apply q1800   Continuous Infusions: . heparin 1,350 Units/hr (11/10/14 0618)    Principal Problem:   DVT (deep venous thrombosis) Active  Problems:   Anxiety state   Essential hypertension   Irritable bowel syndrome   Diabetes mellitus type 2, uncontrolled, without complications   Dilated aortic root   Bicuspid aortic valve   LVE (left ventricular enlargement)   Pancreatic cancer metastasized to liver   Right leg DVT   DVT (deep vein thrombosis) in pregnancy   Cellulitis of right leg   Antineoplastic chemotherapy induced pancytopenia    Time spent: 20 minutes.    Vernell Leep, MD, FACP, FHM. [facility] Pager [phone]  If 7PM-7AM, please contact night-coverage [email or web address] Password [email or web address] 11/10/2014, 5:18 PM    LOS: 4 days

## 2014-11-10 NOTE — Progress Notes (Addendum)
[place] for Heparin, Warfarin Indication: DVT  Allergies  Allergen Reactions  . Naprosyn [Naproxen] Nausea And Vomiting    Very nauseated but no vomiting.  . Metformin And Related Diarrhea    Pt has IBS     Patient Measurements: Height: 5\' 4"  (162.6 cm) Weight: 217 lb 14.4 oz (98.839 kg) IBW/kg (Calculated) : 54.7 Heparin Dosing Weight: 77 kg  Vital Signs: Temp: 98 F (36.7 C) (06/22 0443) Temp Source: Oral (06/22 0443) BP: 135/86 mmHg (06/22 0443) Pulse Rate: 85 (06/22 0443)  Labs:  Recent Labs  11/08/14 0500 11/09/14 0414 11/10/14 0442  HGB 10.6* 11.8* 12.5  HCT 34.3* 36.5 38.3  PLT 34* 34* 44*  LABPROT  --   --  13.9  INR  --   --  1.05  HEPARINUNFRC 0.42 0.46 0.48  CREATININE  --  0.59  --     Estimated Creatinine Clearance: 80 mL/min (by C-G formula based on Cr of 0.59).   Medical History: Past Medical History  Diagnosis Date  . Anxiety   . Hypertension   . IBS (irritable bowel syndrome)   . Hyperplastic colon polyp   . Allergy   . Anemia   . Arthritis   . Diverticulosis   . Dilated aortic root     seen on prior echo but echo 05/2013 showed normal dimensions  . Bicuspid aortic valve   . LVE (left ventricular enlargement)     mild by echo 1/15 with EF 50-55%  . pancreatic ca w/ liver mets dx'd 07/2014  . Diabetes mellitus without complication     Medications:  Scheduled:  . amLODipine  5 mg Oral q morning - 10a  . calcium-vitamin D  1 tablet Oral BID  . doxycycline  100 mg Oral Q12H  . FLUoxetine  20 mg Oral q morning - 10a  . insulin aspart  0-20 Units Subcutaneous TID WC  . insulin aspart  0-5 Units Subcutaneous QHS  . insulin aspart  6 Units Subcutaneous TID WC  . insulin glargine  80 Units Subcutaneous QHS  . irbesartan  300 mg Oral Daily  . loratadine  10 mg Oral Daily  . multivitamin with minerals  1 tablet Oral Daily  . omega-3 acid ethyl esters  1 g Oral Daily  . sodium chloride  3 mL  Intravenous Q12H  . warfarin   Does not apply Once  . Warfarin - Pharmacist Dosing Inpatient   Does not apply q1800   Infusions:  . heparin 1,350 Units/hr (11/10/14 2979)    Assessment:  64 yr female with metastatic pancreatic cancer, undergoing chemotherapy presents with swelling of right lower leg.  Pt s/p fall 2 weeks ago.    Doppler = + DVT or right lower extremity  6/18: Pharmacy initially consulted to dose Lovenox for treatment of DVT.  Platelet count noted to be 46.  RPh spoke with Dr Blaine Hamper and received orders to initiate anticoagulation with IV heparin (d/c lovenox order), which is easily reversible.   6/21: Discussed with Dr. Burr Medico (Oncology)-recommends bridging over to warfarin today since platelet count has not dropped further. She recommends lower INR goal of 2-2.5. She recommends targeting lower heparin level goal of 0.3-0.5 units/mL (heparin level has been within that range over past 2 days). Discussed with Dr. Algis Liming [facility]) as well, who recommends starting warfarin per pharmacy consult as per Oncology guidance.   Today, 11/10/2014:  Heparin level = 0.48 (therapeutic) on heparin 1350 units/hr   CBC:  Hgb stable at 12.5, Pltc increased to 44  INR 1.05 after one dose of warfarin, no change in INR, as expected  No active bleeding issues currently reported per nursing.  Baseline PT/INR 6/18: 14.3/1.09  LFTs elevated 6/19: AST/ALT 107/229, Alk Phos 251, Tbili 1.5  Drug Interactions: Doxycycline, Fluoxetine  Diet: Carb-modified  Goal of Therapy:  Heparin level 0.3-0.5 units/mL INR 2-2.5 Monitor platelets by anticoagulation protocol: Yes   Plan:   Continue heparin gtt at 1350 units/hr  Daily heparin level & CBC.  Warfarin 3 mg PO x 1 today, more conservative dose in setting of thrombocytopenia, elevated liver enzymes, DDIs.  Daily PT/NIR.  Hepatic function panel in AM to evaluate LFTs.  Monitor closely for s/s of bleeding.  Pharmacy to provider warfarin  education prior to discharge.   Lindell Spar, PharmD, BCPS Pager: [phone] 11/10/2014 2:25 PM

## 2014-11-11 ENCOUNTER — Telehealth: Payer: Self-pay

## 2014-11-11 ENCOUNTER — Encounter: Payer: Self-pay | Admitting: *Deleted

## 2014-11-11 LAB — CBC
HCT: 38.9 % (ref 36.0–46.0)
Hemoglobin: 12.4 g/dL (ref 12.0–15.0)
MCH: 28.6 pg (ref 26.0–34.0)
MCHC: 31.9 g/dL (ref 30.0–36.0)
MCV: 89.6 fL (ref 78.0–100.0)
Platelets: 64 10*3/uL — ABNORMAL LOW (ref 150–400)
RBC: 4.34 MIL/uL (ref 3.87–5.11)
RDW: 16.8 % — AB (ref 11.5–15.5)
WBC: 3.2 10*3/uL — ABNORMAL LOW (ref 4.0–10.5)

## 2014-11-11 LAB — HEPATIC FUNCTION PANEL
ALT: 121 U/L — ABNORMAL HIGH (ref 14–54)
AST: 76 U/L — ABNORMAL HIGH (ref 15–41)
Albumin: 3 g/dL — ABNORMAL LOW (ref 3.5–5.0)
Alkaline Phosphatase: 203 U/L — ABNORMAL HIGH (ref 38–126)
BILIRUBIN INDIRECT: 0.3 mg/dL (ref 0.3–0.9)
Bilirubin, Direct: 0.2 mg/dL (ref 0.1–0.5)
Total Bilirubin: 0.5 mg/dL (ref 0.3–1.2)
Total Protein: 5.8 g/dL — ABNORMAL LOW (ref 6.5–8.1)

## 2014-11-11 LAB — HEPARIN LEVEL (UNFRACTIONATED): Heparin Unfractionated: 0.42 IU/mL (ref 0.30–0.70)

## 2014-11-11 LAB — GLUCOSE, CAPILLARY
GLUCOSE-CAPILLARY: 222 mg/dL — AB (ref 65–99)
Glucose-Capillary: 141 mg/dL — ABNORMAL HIGH (ref 65–99)
Glucose-Capillary: 260 mg/dL — ABNORMAL HIGH (ref 65–99)

## 2014-11-11 LAB — PROTIME-INR
INR: 1.04 (ref 0.00–1.49)
Prothrombin Time: 13.8 seconds (ref 11.6–15.2)

## 2014-11-11 MED ORDER — DIPHENHYDRAMINE HCL 12.5 MG/5ML PO ELIX
12.5000 mg | ORAL_SOLUTION | Freq: Four times a day (QID) | ORAL | Status: DC | PRN
  Administered 2014-11-11: 12.5 mg via ORAL
  Filled 2014-11-11: qty 5

## 2014-11-11 MED ORDER — WARFARIN SODIUM 5 MG PO TABS
5.0000 mg | ORAL_TABLET | Freq: Once | ORAL | Status: AC
  Administered 2014-11-11: 5 mg via ORAL
  Filled 2014-11-11: qty 1

## 2014-11-11 MED ORDER — DIPHENHYDRAMINE HCL 50 MG/ML IJ SOLN
12.5000 mg | Freq: Once | INTRAMUSCULAR | Status: AC
  Administered 2014-11-11: 12.5 mg via INTRAVENOUS
  Filled 2014-11-11: qty 1

## 2014-11-11 NOTE — Progress Notes (Signed)
PROGRESS NOTE    Amber Bishop GEZ:[id number] DOB: 06-21-1950 DOA: 11/06/2014 PCP: [name], MD  HPI/Brief narrative 64 y.o. female with PMH of hypertension, diabetes mellitus, depression, anxiety, IBS, arthritis, dilated aortic root, tricuspid aortic valve, pancreatic cancer metastasized to liver (doing chemotherapy currently), who presents with RLE cellulitis. In ED, patient was found to have WBC 3.8, platelet 46, temperature normal, no tachycardia, electrolytes okay. LE venous Doppler revealed right lower extremity DVT and left lower extremity SVT. Cellulitis improved. Currently on IV heparin bridge and Coumadin for DVT. Monitored closely with daily CBCs due to severe thrombocytopenia which is improving. DC home when INR therapeutic.   Assessment/Plan:  Right leg DVT:  - Patient has right leg DVT as evidenced by venous Doppler No signs of PE.  - Patient was empirically started on IV heparin drip due to ease of discontinuation if her thrombocytopenia got worse. Presented with platelet count of 46. - Oncology Dr. Burr Medico consulted: She discussed extensively with patient and recommends Coumadin with target INR 2-2.5 - Started Coumadin (6/21) along with IV heparin bridging - INR: 1.04 after 2 days of Coumadin. Management per pharmacy.  Right leg cellulitis:  - Initially treated empirically with IV vancomycin and Zosyn given immunocompromise status - Blood cultures x 2: Negative to date - Cellulitis findings have now resolved. Current picture is suggestive of purpuric rash from thrombocytopenia. - DC'ed IV antibiotics and started oral doxycycline to complete total 7 days treatment. - DC all antibiotics after 11/13/14 doses.  Depression and anxiety:  - Stable, no suicidal or homicidal ideations. -Continue home medications: When necessary Xanax, Prozac  Essential hypertension: -Switch Diovan-HCTZ to irbesartan -Continue amlodipine -IV hydralazine when necessary - Reasonably  controlled  DM-II:  - Last A1c 10.1 on 05/05/14, poorly controled.  - Patient is taking Lantus 74 units daily at home. Increased Lantus dose from 74 to 80 units daily due to CBGs in the 400s on admission. Adjust sliding scale and add mealtime NovoLog -SSI-moderate - Fluctuating CBGs  Stage IV Pancreatic cancer metastasized to liver:  - diagnosed on 07/2014. Followed up with Dr. Burr Medico. She is currently receiving chemotherapy, completed 4 cycles so far.  -Oncologist input appreciated. Chemotherapy scheduled for 6/21 has been canceled due to her cytopenias. -Abnormal LFTs likely due to this.   Thrombocytopenia/pancytopenia:  - DD: ITP, hypersplenism and chemotherapy -CBC daily while on Heparin gtt - Platelet count have fluctuated from 46 > 53 > 34 >34>44. No overt bleeding except for right leg purpura. Follow CBCs while on anticoagulation. Transfuse platelets if active bleeding or if counts drop? Less than 20,000. - Platelet count improved on 6/23 to 64  Pancytopenia - Secondary to chemotherapy. Follow CBC. All counts improving.  DVT ppx: on IV heparin + Coumadin. Code Status: Full Family Communication: Discussed with patient's son in detail Mr. [name] on 6/20 Disposition Plan: DC home when medically stable.   Consultants:  Oncology/Dr. Burr Medico.  Procedures:  Bilateral lower extremity venous Dopplers 11/06/14: Summary:  - Findings consistent with subacute deep vein thrombosis involving the right common femoral vein, right femoral vein, and right popliteal vein. - Cannot exclude deep vein thrombosis involving the right posterial tibial vein and right peroneal vein. - No obvious evidence of deep vein thrombosis involving the left lower extremity. - Findings consistent with acute superficial vein thrombosis involving the left greater saphenous vein from mid calf to ankle.   Antibiotics:  IV vancomycin 6/18 > 6/21  IV Zosyn 6/18 > 6/20  Oral doxycycline  6/21  > 6/25  Subjective: No new complaints.  No bleeding reported.  Objective: Filed Vitals:   11/10/14 0443 11/10/14 1353 11/10/14 2115 11/11/14 0521  BP: 135/86 157/84 143/66 133/72  Pulse: 85 90 86 89  Temp: 98 F (36.7 C) 98 F (36.7 C) 98.8 F (37.1 C) 99.2 F (37.3 C)  TempSrc: Oral Oral Oral Oral  Resp: 18  16 18   Height:      Weight:      SpO2: 99% 100% 98% 98%    Intake/Output Summary (Last 24 hours) at 11/11/14 1320 Last data filed at 11/11/14 0844  Gross per 24 hour  Intake 557.25 ml  Output      0 ml  Net 557.25 ml   Filed Weights   11/06/14 2138  Weight: 98.839 kg (217 lb 14.4 oz)     Exam:  General exam: Pleasant middle-aged female lying comfortably in bed Respiratory system: Clear. No increased work of breathing. Cardiovascular system: S1 & S2 heard, RRR. No JVD, murmurs, gallops, clicks or pedal edema.  Gastrointestinal system: Abdomen is nondistended, soft and nontender. Normal bowel sounds heard. Central nervous system: Alert and oriented. No focal neurological deficits. Extremities: Symmetric 5 x 5 power. Cellulitis findings have resolved. Diffuse brownish discoloration of right leg-likely from coalesced purpura from thrombocytopenia. Scabs of healing abrasions bilateral knees/legs. Trace bilateral leg edema   Data Reviewed: Basic Metabolic Panel:  Recent Labs Lab 11/06/14 1548 11/07/14 0501 11/09/14 0414  NA 132* 137 138  K 4.5 4.0 4.0  CL 103 98* 102  CO2 26 28 28   GLUCOSE 452* 301* 152*  BUN 20 13 11   CREATININE 0.78 0.66 0.59  CALCIUM 7.9* 8.5* 8.5*   Liver Function Tests:  Recent Labs Lab 11/07/14 0501 11/11/14 0500  AST 107* 76*  ALT 229* 121*  ALKPHOS 251* 203*  BILITOT 1.5* 0.5  PROT 6.3* 5.8*  ALBUMIN 3.5 3.0*   No results for input(s): LIPASE, AMYLASE in the last 168 hours. No results for input(s): AMMONIA in the last 168 hours. CBC:  Recent Labs Lab 11/06/14 1548 11/07/14 0501 11/08/14 0500 11/09/14 0414  11/10/14 0442 11/11/14 0500  WBC 3.8* 4.5 2.8* 2.3* 3.1* 3.2*  NEUTROABS 2.8  --   --   --   --   --   HGB 12.0 12.9 10.6* 11.8* 12.5 12.4  HCT 36.7 40.7 34.3* 36.5 38.3 38.9  MCV 89.7 90.2 89.3 89.0 88.0 89.6  PLT 46* 53* 34* 34* 44* 64*   Cardiac Enzymes: No results for input(s): CKTOTAL, CKMB, CKMBINDEX, TROPONINI in the last 168 hours. BNP (last 3 results) No results for input(s): PROBNP in the last 8760 hours. CBG:  Recent Labs Lab 11/10/14 1127 11/10/14 1717 11/10/14 2113 11/11/14 0733 11/11/14 1142  GLUCAP 226* 241* 202* 141* 222*    Recent Results (from the past 240 hour(s))  Culture, blood (x 2)     Status: None (Preliminary result)   Collection Time: 11/06/14  9:50 PM  Result Value Ref Range Status   Specimen Description BLOOD RIGHT ARM  Final   Special Requests BOTTLES DRAWN AEROBIC AND ANAEROBIC 8CC  Final   Culture   Final    NO GROWTH 4 DAYS Performed at [facility]    Report Status PENDING  Incomplete  Culture, blood (x 2)     Status: None (Preliminary result)   Collection Time: 11/06/14  9:54 PM  Result Value Ref Range Status   Specimen Description BLOOD LEFT HAND  Final  Special Requests BOTTLES DRAWN AEROBIC ONLY 8CC  Final   Culture   Final    NO GROWTH 4 DAYS Performed at [facility]    Report Status PENDING  Incomplete         Studies: No results found.      Scheduled Meds: . amLODipine  5 mg Oral q morning - 10a  . calcium-vitamin D  1 tablet Oral BID  . doxycycline  100 mg Oral Q12H  . FLUoxetine  20 mg Oral q morning - 10a  . insulin aspart  0-20 Units Subcutaneous TID WC  . insulin aspart  0-5 Units Subcutaneous QHS  . insulin aspart  6 Units Subcutaneous TID WC  . insulin glargine  80 Units Subcutaneous QHS  . irbesartan  300 mg Oral Daily  . loratadine  10 mg Oral Daily  . multivitamin with minerals  1 tablet Oral Daily  . omega-3 acid ethyl esters  1 g Oral Daily  . sodium chloride  3 mL Intravenous  Q12H  . warfarin   Does not apply Once  . Warfarin - Pharmacist Dosing Inpatient   Does not apply q1800   Continuous Infusions: . heparin 1,350 Units/hr (11/10/14 0618)    Principal Problem:   DVT (deep venous thrombosis) Active Problems:   Anxiety state   Essential hypertension   Irritable bowel syndrome   Diabetes mellitus type 2, uncontrolled, without complications   Dilated aortic root   Bicuspid aortic valve   LVE (left ventricular enlargement)   Pancreatic cancer metastasized to liver   Right leg DVT   DVT (deep vein thrombosis) in pregnancy   Cellulitis of right leg   Antineoplastic chemotherapy induced pancytopenia    Time spent: 20 minutes.    Vernell Leep, MD, FACP, FHM. [facility] Pager [phone]  If 7PM-7AM, please contact night-coverage [email or web address] Password [email or web address] 11/11/2014, 1:20 PM    LOS: 5 days

## 2014-11-11 NOTE — Consult Note (Signed)
   [facility] CM Inpatient Consult   11/11/2014  KLYNN LINNEMANN 27-Apr-1951 [id number]   Spoke with patient at bedside to discuss and explain [facility] Care Management services. She reports she lives alone. States her boyfriend takes her to her MD appointments. Her son lives in [place]. She also indicates she often has trouble affording her insulin. States her Novolog insulin was 208.85 and her Lantus was 170.00 most recently. Reports it is getting harder and harder for her to afford her insulin along with all of her other bills, scans, and cancer treatments. She goes to [employer] on [address]. Ms. Shanda Bumps states she is not sure if she is in the doughnut hole or not. She endorses she needs continued reinforcement regarding carb counting with DM. Explained to her that [facility] will perform her initial post transitional of care calls and [facility] will follow up thereafter. In the meantime, discussed that writer will make a referral to [facility] pharmD for possible medication assistance. Ms. Smigelski agreeable and consents signed. Appreciative of any assistance or help she can receive. Confirms her Primary Care MD is Dr. Harlan Stains. She states her cancer treatments were on Tuesdays for this month but is not sure what the schedule will be after hospital discharge. Confirmed her best contact information [phone]. Spoke with [facility] to discuss the above. Inpatient RNCM aware that [facility] to follow.  Marthenia Rolling, MSN-Ed, RN,BSN [facility] Liaison [phone]

## 2014-11-11 NOTE — Progress Notes (Addendum)
[place] for Heparin, Warfarin Indication: DVT  Allergies  Allergen Reactions  . Naprosyn [Naproxen] Nausea And Vomiting    Very nauseated but no vomiting.  . Metformin And Related Diarrhea    Pt has IBS     Patient Measurements: Height: 5\' 4"  (162.6 cm) Weight: 217 lb 14.4 oz (98.839 kg) IBW/kg (Calculated) : 54.7 Heparin Dosing Weight: 77 kg  Vital Signs: Temp: 98.2 F (36.8 C) (06/23 1351) Temp Source: Oral (06/23 1351) BP: 135/64 mmHg (06/23 1351) Pulse Rate: 96 (06/23 1351)  Labs:  Recent Labs  11/09/14 0414 11/10/14 0442 11/11/14 0500  HGB 11.8* 12.5 12.4  HCT 36.5 38.3 38.9  PLT 34* 44* 64*  LABPROT  --  13.9 13.8  INR  --  1.05 1.04  HEPARINUNFRC 0.46 0.48 0.42  CREATININE 0.59  --   --     Estimated Creatinine Clearance: 80 mL/min (by C-G formula based on Cr of 0.59).   Medical History: Past Medical History  Diagnosis Date  . Anxiety   . Hypertension   . IBS (irritable bowel syndrome)   . Hyperplastic colon polyp   . Allergy   . Anemia   . Arthritis   . Diverticulosis   . Dilated aortic root     seen on prior echo but echo 05/2013 showed normal dimensions  . Bicuspid aortic valve   . LVE (left ventricular enlargement)     mild by echo 1/15 with EF 50-55%  . pancreatic ca w/ liver mets dx'd 07/2014  . Diabetes mellitus without complication     Medications:  Scheduled:  . amLODipine  5 mg Oral q morning - 10a  . calcium-vitamin D  1 tablet Oral BID  . doxycycline  100 mg Oral Q12H  . FLUoxetine  20 mg Oral q morning - 10a  . insulin aspart  0-20 Units Subcutaneous TID WC  . insulin aspart  0-5 Units Subcutaneous QHS  . insulin aspart  6 Units Subcutaneous TID WC  . insulin glargine  80 Units Subcutaneous QHS  . irbesartan  300 mg Oral Daily  . loratadine  10 mg Oral Daily  . multivitamin with minerals  1 tablet Oral Daily  . omega-3 acid ethyl esters  1 g Oral Daily  . sodium chloride  3 mL  Intravenous Q12H  . warfarin  5 mg Oral ONCE-1800  . warfarin   Does not apply Once  . Warfarin - Pharmacist Dosing Inpatient   Does not apply q1800   Infusions:  . heparin 1,350 Units/hr (11/10/14 9702)    Assessment:  64 yr female with metastatic pancreatic cancer, undergoing chemotherapy presents with swelling of right lower leg.  Pt s/p fall 2 weeks ago.    Doppler = + DVT or right lower extremity  6/18: Pharmacy initially consulted to dose Lovenox for treatment of DVT.  Platelet count noted to be 46.  RPh spoke with Dr Blaine Hamper and received orders to initiate anticoagulation with IV heparin (d/c lovenox order), which is easily reversible.   6/21: Discussed with Dr. Burr Medico (Oncology)-recommends bridging over to warfarin today since platelet count has not dropped further. She recommends lower INR goal of 2-2.5. She recommends targeting lower heparin level goal of 0.3-0.5 units/mL (heparin level has been within that range over past 2 days). Discussed with Dr. Algis Liming [facility]) as well, who recommends starting warfarin per pharmacy consult as per Oncology guidance.   Today, 11/11/2014:  Heparin level = 0.42 (therapeutic) on heparin 1350  units/hr   CBC:  Hgb stable at 12.4, Plt increased to 64  INR 1.04   No active bleeding issues currently reported per nursing.  LFTs elevated, but improving  Drug Interactions: Doxycycline, Fluoxetine  Diet: Carb-modified, eating 75% of meals  Goal of Therapy:  Heparin level 0.3-0.5 units/mL INR 2-2.5 Monitor platelets by anticoagulation protocol: Yes   Plan:   Continue heparin gtt at 1350 units/hr  Daily heparin level & CBC.  Increase Warfarin to 5 mg PO x 1 today.  Daily PT/NIR.  Monitor closely for s/s of bleeding.  Verbal/written warfarin education provided to patient. Nursing to set up video for patient to watch (discussed with primary RN today).    Lindell Spar, PharmD, BCPS Pager: [phone] 11/11/2014 2:06 PM

## 2014-11-11 NOTE — Telephone Encounter (Signed)
Cousin Marina Goodell is looking for a health alert system for the pt. She states she is willing to pay for it. She described something you wear around your neck and push the button if you need help. Bethena Roys was wanting to know if there is one provided by [facility]. I said I do not believe so but will forwarded to social worker for information. Judy's phone number is [phone]. She lives in [place].

## 2014-11-11 NOTE — Progress Notes (Signed)
NUTRITION NOTE  Pt seen per pt and RN request for DM diet education. Pt reports that she has had this education in the past but that it was about 20 years ago. She indicates that she has been trying to cook for her and her boyfriend but does not know how many carbs to eat/meal or per day.   She states that she has been losing weight recently at the encouragement of her PCP but that her oncologist is concerned about her losing too much weight too rapidly. Talked with pt about this and agree with oncologist, especially given the nature of her cancer dx and recent treatment.   Talked with pt about carbohydrate counting and the reason to spread carbohydrate intake throughout the day. Informed her to consume 45-60 grams of carb/meal and 15-30 grams/snack.  Pt needed frequent redirecting and reenforcement as she would start talking about other things from her medical course, family, or personal life. Based on this, unsure if pt is well educated on the topic at this time.  Provided her with Label Reading Tips and Carbohydrate Counting for People with Diabetes handouts from the Academy of Nutrition and Dietetics and 1800 Carbohydrate Controlled Meal Plan from Nutrition 411 website; advised pt not to follow calorie-restriction portion of this plan but to focus on the carbohydrate servings and tips.  Expect fair compliance. During admission, CBGs: 141-241 mg/dL.  Jarome Matin, RD, LDN Inpatient Clinical Dietitian Pager # [phone] After hours/weekend pager # [phone]

## 2014-11-11 NOTE — Plan of Care (Signed)
Problem: Consults Goal: Diagnosis - Venous Thromboembolism (VTE) Choose a selection  DVT (Deep Vein Thrombosis)     

## 2014-11-11 NOTE — Progress Notes (Signed)
Physical Therapy Treatment and Discharge from Acute PT Patient Details Name: Amber Bishop MRN: [id number] DOB: August 10, 1950 Today's Date: 11/21/2014    History of Present Illness Amber Bishop is a 64 y.o. female with PMH of hypertension, diabetes mellitus, depression, anxiety, IBS, arthritis, dilated aortic root, tricuspid aortic valve, pancreatic cancer metastasized to liver (doing chemotherapy currently), who presents with  RLE cellulitis, + DVT    PT Comments    Pt ambulating well in hallway and encouraged pt to ambulate 2-3 times each day with staff (for safety being on anticoagulants).  Pt has met goals and is agreeable to d/c from acute PT at this time.  Follow Up Recommendations  No PT follow up (may benefit from HHPT upon restarting chemo)     Equipment Recommendations  None recommended by PT    Recommendations for Other Services       Precautions / Restrictions Precautions Precautions: Fall    Mobility  Bed Mobility Overal bed mobility: Modified Independent                Transfers Overall transfer level: Modified independent                  Ambulation/Gait Ambulation/Gait assistance: Supervision;Modified independent (Device/Increase time) Ambulation Distance (Feet): 400 Feet Assistive device: None Gait Pattern/deviations: WFL(Within Functional Limits)     General Gait Details: pt pushed IV pole, reports feeling good ambulating   Stairs            Wheelchair Mobility    Modified Rankin (Stroke Patients Only)       Balance                                    Cognition Arousal/Alertness: Awake/alert Behavior During Therapy: WFL for tasks assessed/performed   Area of Impairment: Attention   Current Attention Level: Sustained           General Comments: Pt easily self distracts and demonstrates difficulty staying on topic/task.  (possibly her baseline)    Exercises      General Comments         Pertinent Vitals/Pain Pain Assessment: No/denies pain    Home Living                      Prior Function            PT Goals (current goals can now be found in the care plan section) Progress towards PT goals: Goals met/education completed, patient discharged from PT    Frequency       PT Plan Discharge plan needs to be updated    Co-evaluation             End of Session   Activity Tolerance: Patient tolerated treatment well Patient left: in bed;with call bell/phone within reach     Time: 1314-1330 PT Time Calculation (min) (ACUTE ONLY): 16 min  Charges:  $Gait Training: 8-22 mins                    G Codes:      Elisabel Hanover,KATHrine E Nov 21, 2014, 2:03 PM Carmelia Bake, PT, DPT 11/21/14 Pager: [phone]

## 2014-11-12 ENCOUNTER — Other Ambulatory Visit: Payer: Self-pay

## 2014-11-12 DIAGNOSIS — T451X5A Adverse effect of antineoplastic and immunosuppressive drugs, initial encounter: Secondary | ICD-10-CM

## 2014-11-12 DIAGNOSIS — D61810 Antineoplastic chemotherapy induced pancytopenia: Secondary | ICD-10-CM

## 2014-11-12 LAB — GLUCOSE, CAPILLARY
GLUCOSE-CAPILLARY: 191 mg/dL — AB (ref 65–99)
GLUCOSE-CAPILLARY: 205 mg/dL — AB (ref 65–99)
GLUCOSE-CAPILLARY: 198 mg/dL — AB (ref 65–99)
Glucose-Capillary: 311 mg/dL — ABNORMAL HIGH (ref 65–99)
Glucose-Capillary: 216 mg/dL — ABNORMAL HIGH (ref 65–99)

## 2014-11-12 LAB — CULTURE, BLOOD (ROUTINE X 2)
CULTURE: NO GROWTH
Culture: NO GROWTH

## 2014-11-12 LAB — PROTIME-INR
INR: 1.06 (ref 0.00–1.49)
PROTHROMBIN TIME: 14 s (ref 11.6–15.2)

## 2014-11-12 LAB — CBC
HCT: 37.5 % (ref 36.0–46.0)
Hemoglobin: 11.8 g/dL — ABNORMAL LOW (ref 12.0–15.0)
MCH: 27.8 pg (ref 26.0–34.0)
MCHC: 31.5 g/dL (ref 30.0–36.0)
MCV: 88.2 fL (ref 78.0–100.0)
Platelets: 70 10*3/uL — ABNORMAL LOW (ref 150–400)
RBC: 4.25 MIL/uL (ref 3.87–5.11)
RDW: 16.8 % — ABNORMAL HIGH (ref 11.5–15.5)
WBC: 2.8 10*3/uL — ABNORMAL LOW (ref 4.0–10.5)

## 2014-11-12 LAB — HEPARIN LEVEL (UNFRACTIONATED)
HEPARIN UNFRACTIONATED: 0.27 [IU]/mL — AB (ref 0.30–0.70)
HEPARIN UNFRACTIONATED: 0.44 [IU]/mL (ref 0.30–0.70)
HEPARIN UNFRACTIONATED: 0.37 [IU]/mL (ref 0.30–0.70)

## 2014-11-12 MED ORDER — WARFARIN VIDEO
Freq: Once | Status: AC
  Administered 2014-11-12: 10:00:00

## 2014-11-12 MED ORDER — WARFARIN SODIUM 5 MG PO TABS
7.5000 mg | ORAL_TABLET | Freq: Once | ORAL | Status: AC
  Administered 2014-11-12: 7.5 mg via ORAL
  Filled 2014-11-12 (×2): qty 1

## 2014-11-12 MED ORDER — DIPHENHYDRAMINE-ZINC ACETATE 2-0.1 % EX CREA
TOPICAL_CREAM | Freq: Two times a day (BID) | CUTANEOUS | Status: DC | PRN
  Administered 2014-11-12 – 2014-11-14 (×3): via TOPICAL
  Filled 2014-11-12: qty 28

## 2014-11-12 MED ORDER — INSULIN ASPART 100 UNIT/ML ~~LOC~~ SOLN
10.0000 [IU] | Freq: Three times a day (TID) | SUBCUTANEOUS | Status: DC
  Administered 2014-11-12: 7 [IU] via SUBCUTANEOUS
  Administered 2014-11-13 (×2): 10 [IU] via SUBCUTANEOUS

## 2014-11-12 MED ORDER — HEPARIN (PORCINE) IN NACL 100-0.45 UNIT/ML-% IJ SOLN
1400.0000 [IU]/h | INTRAMUSCULAR | Status: DC
  Administered 2014-11-12 – 2014-11-13 (×2): 1400 [IU]/h via INTRAVENOUS
  Filled 2014-11-12 (×3): qty 250

## 2014-11-12 MED ORDER — GLUCERNA SHAKE PO LIQD
237.0000 mL | Freq: Two times a day (BID) | ORAL | Status: DC
  Administered 2014-11-13 – 2014-11-15 (×5): 237 mL via ORAL
  Filled 2014-11-12 (×7): qty 237

## 2014-11-12 NOTE — Progress Notes (Signed)
Pharmacy - Heparin Dosing  Assessment: (see earlier note from [place] for warfarin dosing) 8 yoF undergoing chemo for metastatic pancreatic cancer with DVT now on heparin/warfarin; targeting lower INR and anti-Xa ranges d/t thrombocytopenia  Today, 11/12/2014:  Heparin level therapeutic on 1400 units/hr.  Had previously been at goal on 1350 units/hr, then dropped.    CBC: Hgb relatively stable at 11.8, Plt increased to 70  INR essentially unchanged despite increased dose, 1.06   No active bleeding issues currently reported per nursing  Goal of Therapy:  Heparin level 0.3-0.5 units/mL INR 2-2.5 Monitor platelets by anticoagulation protocol: Yes  Plan:   Continue heparin IV at 1400 units/hr  Will recheck heparin level at 8p, given recent excursion in HL after being stable  Daily heparin level & CBC  Monitor closely for s/s of bleeding  See earlier note from [place] for warfarin dosing  Reuel Boom, PharmD Pager: [phone] 11/12/2014, 1:54 PM

## 2014-11-12 NOTE — Progress Notes (Signed)
Amber Bishop   DOB:20-Jul-1960   EX#:[id number]   LFY#:[id number]  Subjective:  Amber Bishop is doing better, walked in the hallway today, right low leg edema improved. Plt up to 70 today, still on heparin drip no bleeding.     Objective:  Filed Vitals:   11/12/14 2012  BP: 122/67  Pulse: 94  Temp: 98.1 F (36.7 C)  Resp: 19    Body mass index is 37.38 kg/(m^2).  Intake/Output Summary (Last 24 hours) at 11/12/14 2326 Last data filed at 11/12/14 1823  Gross per 24 hour  Intake 1286.4 ml  Output      0 ml  Net 1286.4 ml     Sclerae unicteric  Oropharynx clear  No peripheral adenopathy  Lungs clear -- no rales or rhonchi  Heart regular rate and rhythm  Abdomen benign  MSK no focal spinal tenderness, no peripheral edema  Neuro nonfocal  Skin: (+) Petechia on both lower extremities, more on the right. (+) Diffuse skin erythema on right lower extremity below knee, darker than before   CBG (last 3)   Recent Labs  11/12/14 1142 11/12/14 1622 11/12/14 2107  GLUCAP 198* 311* 216*     Labs:  Lab Results  Component Value Date   WBC 2.8* 11/12/2014   HGB 11.8* 11/12/2014   HCT 37.5 11/12/2014   MCV 88.2 11/12/2014   PLT 70* 11/12/2014   NEUTROABS 2.8 11/06/2014    @LASTCHEMISTRY @  Urine Studies No results for input(s): UHGB, CRYS in the last 72 hours.  Invalid input(s): UACOL, UAPR, USPG, UPH, UTP, UGL, UKET, UBIL, UNIT, UROB, ULEU, UEPI, UWBC, URBC, UBAC, CAST, [place], [place]  Basic Metabolic Panel:  Recent Labs Lab 11/06/14 1548 11/07/14 0501 11/09/14 0414  NA 132* 137 138  K 4.5 4.0 4.0  CL 103 98* 102  CO2 26 28 28   GLUCOSE 452* 301* 152*  BUN 20 13 11   CREATININE 0.78 0.66 0.59  CALCIUM 7.9* 8.5* 8.5*   GFR Estimated Creatinine Clearance: 80 mL/min (by C-G formula based on Cr of 0.59). Liver Function Tests:  Recent Labs Lab 11/07/14 0501 11/11/14 0500  AST 107* 76*  ALT 229* 121*  ALKPHOS 251* 203*  BILITOT 1.5* 0.5  PROT 6.3* 5.8*  ALBUMIN 3.5  3.0*   No results for input(s): LIPASE, AMYLASE in the last 168 hours. No results for input(s): AMMONIA in the last 168 hours. Coagulation profile  Recent Labs Lab 11/06/14 2150 11/10/14 0442 11/11/14 0500 11/12/14 0522  INR 1.09 1.05 1.04 1.06    CBC:  Recent Labs Lab 11/06/14 1548  11/08/14 0500 11/09/14 0414 11/10/14 0442 11/11/14 0500 11/12/14 0522  WBC 3.8*  < > 2.8* 2.3* 3.1* 3.2* 2.8*  NEUTROABS 2.8  --   --   --   --   --   --   HGB 12.0  < > 10.6* 11.8* 12.5 12.4 11.8*  HCT 36.7  < > 34.3* 36.5 38.3 38.9 37.5  MCV 89.7  < > 89.3 89.0 88.0 89.6 88.2  PLT 46*  < > 34* 34* 44* 64* 70*  < > = values in this interval not displayed. Cardiac Enzymes: No results for input(s): CKTOTAL, CKMB, CKMBINDEX, TROPONINI in the last 168 hours. BNP: Invalid input(s): POCBNP CBG:  Recent Labs Lab 11/11/14 2151 11/12/14 0735 11/12/14 1142 11/12/14 1622 11/12/14 2107  GLUCAP 191* 205* 198* 311* 216*   D-Dimer No results for input(s): DDIMER in the last 72 hours. Hgb A1c No results for input(s): HGBA1C in  the last 72 hours. Lipid Profile No results for input(s): CHOL, HDL, LDLCALC, TRIG, CHOLHDL, LDLDIRECT in the last 72 hours. Thyroid function studies No results for input(s): TSH, T4TOTAL, T3FREE, THYROIDAB in the last 72 hours.  Invalid input(s): FREET3 Anemia work up No results for input(s): VITAMINB12, FOLATE, FERRITIN, TIBC, IRON, RETICCTPCT in the last 72 hours. Microbiology Recent Results (from the past 240 hour(s))  Culture, blood (x 2)     Status: None   Collection Time: 11/06/14  9:50 PM  Result Value Ref Range Status   Specimen Description BLOOD RIGHT ARM  Final   Special Requests BOTTLES DRAWN AEROBIC AND ANAEROBIC 8CC  Final   Culture   Final    NO GROWTH 5 DAYS Performed at [facility]    Report Status 11/12/2014 FINAL  Final  Culture, blood (x 2)     Status: None   Collection Time: 11/06/14  9:54 PM  Result Value Ref Range Status    Specimen Description BLOOD LEFT HAND  Final   Special Requests BOTTLES DRAWN AEROBIC ONLY [place]  Final   Culture   Final    NO GROWTH 5 DAYS Performed at [facility]    Report Status 11/12/2014 FINAL  Final      Studies:  No results found.  Assessment: 64 y.o.  With stage IV diabetic cancer, under second line chemotherapy currently.  1.  Extensive  No occlusive Right lower extremity DVT 2.  Right lower extremity cellulitis .  3.  Stage IV  pancreatic cancer with liver metastasis 4.   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 Pancytopenia from chemo  6. Abnormal liver function, likely secondary to liver mets     Plan:  -Her plt is much better, continue coumadin  -Since her INR is still subtherapeutic, OK to discharge home with lovenox bridging if she is comfortable with injection, and otherwise ready to go home, she can follow up with our coumadin clinic on Monday or Tuesday  -I will see her next week in my clinic   Please call if you have questions.    Truitt Merle, MD 11/12/2014  11:26 PM

## 2014-11-12 NOTE — Progress Notes (Signed)
Initial Nutrition Assessment  DOCUMENTATION CODES:  Obesity unspecified  INTERVENTION: - Continue Carb Modified diet - Will order Glucerna Shake BID, each supplement provides 220 kcal and 10 grams of protein - RD will continue to monitor for needs  NUTRITION DIAGNOSIS:  Increased nutrient needs related to cancer and cancer related treatments as evidenced by estimated needs.  GOAL:  Patient will meet greater than or equal to 90% of their needs  MONITOR:  PO intake, Weight trends, Labs, I & O's  REASON FOR ASSESSMENT:  Consult Assessment of nutrition requirement/status  ASSESSMENT: 64 y.o. female with PMH of hypertension, diabetes mellitus, depression, anxiety, IBS, arthritis, dilated aortic root, tricuspid aortic valve, pancreatic cancer metastasized to liver (doing chemotherapy currently), who presents with: patient reports that she had a mechanical fall 2 weeks ago and suffered abrasions to bilateral lower extremities.  Pt seen for consult. DM education done with pt yesterday and handouts left at that time. PTA, pt reports she had a good appetite although she was trying to lose weight at the recommendation of her PCP. Her mother had purchased smaller plates for her to use to assist with portion control. Advised pt that weight loss should be gradual and that with cancer and undergoing chemo her focus should be on maintaining strength and energy through meals rather than weight loss. Weight hx review indicates stable weight (4 lb weight loss, which is minimal) x3 months PTA  She states she did most of the cooking PTA when she felt strong enough. Since admission she has been eating 75-100% on average.  Minimally meeting needs. No muscle or fat wasting present. Medications reviewed. CBGs: 140-271 mg/dL.  Height:  Ht Readings from Last 1 Encounters:  11/06/14 5\' 4"  (1.626 m)    Weight:  Wt Readings from Last 1 Encounters:  11/06/14 217 lb 14.4 oz (98.839 kg)    Ideal Body  Weight:  54.55 kg (kg)  Wt Readings from Last 10 Encounters:  11/06/14 217 lb 14.4 oz (98.839 kg)  10/28/14 216 lb 1.6 oz (98.022 kg)  10/14/14 219 lb (99.338 kg)  09/30/14 222 lb 4.8 oz (100.835 kg)  09/16/14 218 lb 4.8 oz (99.02 kg)  09/14/14 218 lb (98.884 kg)  09/09/14 218 lb 11.2 oz (99.202 kg)  09/02/14 217 lb 4.8 oz (98.567 kg)  08/24/14 209 lb 1.6 oz (94.847 kg)  08/17/14 221 lb (100.245 kg)    BMI:  Body mass index is 37.38 kg/(m^2).  Estimated Nutritional Needs:  Kcal:  2400-2600  Protein:  90-100 grams  Fluid:  2.2 L/day  Skin:  Wound (see comment) (Bilateral leg abrasions)  Diet Order:  Diet Carb Modified Fluid consistency:: Thin; Room service appropriate?: Yes  EDUCATION NEEDS:  Education needs addressed   Intake/Output Summary (Last 24 hours) at 11/12/14 0843 Last data filed at 11/12/14 0700  Gross per 24 hour  Intake 1530.48 ml  Output      0 ml  Net 1530.48 ml    Last BM:  PTA    Jarome Matin, RD, LDN Inpatient Clinical Dietitian Pager # [phone] After hours/weekend pager # [phone]

## 2014-11-12 NOTE — Patient Outreach (Signed)
[facility]) Care Management  11/12/2014  LENDORA KEYS 1951-03-29 [id number]   Referral from Pharmacy from [facility], RN, assigned Deanne Coffer, PharmD.  Ronnell Freshwater. [place], [place] Management [place] Assistant Phone: [phone] Fax: [phone]

## 2014-11-12 NOTE — Progress Notes (Signed)
[place] for Heparin, Warfarin Indication: DVT  Allergies  Allergen Reactions  . Naprosyn [Naproxen] Nausea And Vomiting    Very nauseated but no vomiting.  . Metformin And Related Diarrhea    Pt has IBS     Patient Measurements: Height: 5\' 4"  (162.6 cm) Weight: 217 lb 14.4 oz (98.839 kg) IBW/kg (Calculated) : 54.7 Heparin Dosing Weight: 77 kg  Vital Signs: Temp: 98.2 F (36.8 C) (06/24 0532) Temp Source: Oral (06/24 0532) BP: 154/72 mmHg (06/24 0532) Pulse Rate: 88 (06/24 0532)  Labs:  Recent Labs  11/10/14 0442 11/11/14 0500 11/12/14 0522  HGB 12.5 12.4 11.8*  HCT 38.3 38.9 37.5  PLT 44* 64* 70*  LABPROT 13.9 13.8 14.0  INR 1.05 1.04 1.06  HEPARINUNFRC 0.48 0.42 0.27*    Estimated Creatinine Clearance: 80 mL/min (by C-G formula based on Cr of 0.59).   Medical History: Past Medical History  Diagnosis Date  . Anxiety   . Hypertension   . IBS (irritable bowel syndrome)   . Hyperplastic colon polyp   . Allergy   . Anemia   . Arthritis   . Diverticulosis   . Dilated aortic root     seen on prior echo but echo 05/2013 showed normal dimensions  . Bicuspid aortic valve   . LVE (left ventricular enlargement)     mild by echo 1/15 with EF 50-55%  . pancreatic ca w/ liver mets dx'd 07/2014  . Diabetes mellitus without complication     Medications:  Scheduled:  . amLODipine  5 mg Oral q morning - 10a  . calcium-vitamin D  1 tablet Oral BID  . doxycycline  100 mg Oral Q12H  . FLUoxetine  20 mg Oral q morning - 10a  . insulin aspart  0-20 Units Subcutaneous TID WC  . insulin aspart  0-5 Units Subcutaneous QHS  . insulin aspart  6 Units Subcutaneous TID WC  . insulin glargine  80 Units Subcutaneous QHS  . irbesartan  300 mg Oral Daily  . loratadine  10 mg Oral Daily  . multivitamin with minerals  1 tablet Oral Daily  . omega-3 acid ethyl esters  1 g Oral Daily  . sodium chloride  3 mL Intravenous Q12H  . warfarin   7.5 mg Oral ONCE-1800  . warfarin   Does not apply Once  . Warfarin - Pharmacist Dosing Inpatient   Does not apply q1800   Infusions:  . heparin 1,400 Units/hr (11/12/14 7829)    Assessment:  64 yr female with metastatic pancreatic cancer, undergoing chemotherapy presents with swelling of right lower leg.  Pt s/p fall 2 weeks ago.    Doppler = + DVT or right lower extremity  6/18: Pharmacy initially consulted to dose Lovenox for treatment of DVT.  Platelet count noted to be 46.  RPh spoke with Dr Blaine Hamper and received orders to initiate anticoagulation with IV heparin (d/c lovenox order), which is easily reversible.   6/21: Discussed with Dr. Burr Medico (Oncology)-recommends bridging over to warfarin today since platelet count has not dropped further. She recommends lower INR goal of 2-2.5. She recommends targeting lower heparin level goal of 0.3-0.5 units/mL (heparin level has been within that range over past 2 days). Discussed with Dr. Algis Liming [facility]) as well, who recommends starting warfarin per pharmacy consult as per Oncology guidance.   Today, 11/12/2014:  Heparin level at 0522 = 0.27 (subtherapeutic) this AM on heparin 1350 units/hr, rate increased to 1400 units/hr   CBC:  Hgb relatively stable at 11.8, Plt increased to 70  INR essentially unchanged despite increased dose, 1.06   No active bleeding issues currently reported per nursing.  LFTs elevated, but improving  Drug Interactions: Doxycycline, Fluoxetine  Diet: Carb-modified, eating 75-100% of meals  Goal of Therapy:  Heparin level 0.3-0.5 units/mL INR 2-2.5 Monitor platelets by anticoagulation protocol: Yes   Plan:   Continue heparin gtt at 1400 units/hr  F/u heparin level at 1300.  Daily heparin level & CBC.  Increase Warfarin to 7.5 mg PO x 1 today.  Daily PT/NIR.  Monitor closely for s/s of bleeding.   Lindell Spar, PharmD, BCPS Pager: [phone] 11/12/2014 7:09 AM

## 2014-11-12 NOTE — Patient Outreach (Signed)
Ms. Jasper is a 64 year old female who was referred to me to review the cost of her insulin.  She is currently in the hospital.  I was able to visit her and discuss the cost of insulin and determine if she would qualify for assistance from [employer] or the pharmaceutical company.  She is currently paying around $208 for her Lantus and $150 for her Novolog.  This most likely reflects that she is in the donut hole (medicare gap).  To qualify for assistance from the pharmaceutical companies, she needs to be denied Extra Help, have a low income and be in the donut hole.  I was able to complete the application for Extra Help. She most likely will be denied due to her combined savings amount.  Once she has been denied, she will be able to apply for assistance through the pharmaceutical companies.  It is unclear if the combined savings will effect her ability to qualify for assistance from the companies.  I will refer her to our care management assistant to complete the paperwork once she is discharged.  I am willing to assist with purchasing her medications upon discharge to help prevent her from readmitting due to not having them.  She will call me if she is discharged over the weekend.  I have discussed this with Marthenia Rolling, RN, [facility] Liaison.  I will follow up next week if she is not discharged over the weekend.    Deanne Coffer, PharmD, [place] [phone]

## 2014-11-12 NOTE — Progress Notes (Signed)
Pharmacy - Heparin Dosing  Labs: heparin level 0.37  Assessment: (see earlier note from [place] for warfarin dosing) 62 yoF undergoing chemo for metastatic pancreatic cancer with DVT now on heparin/warfarin; targeting lower INR and anti-Xa ranges d/t thrombocytopenia  PM Today, 11/12/2014:  Heparin level therapeutic x 2 on 1400 units/hr.    CBC: Hgb relatively stable at 11.8, Plt increased to 70  INR essentially unchanged despite increased dose, 1.06   No active bleeding issues currently reported per nursing  Goal of Therapy:  Heparin level 0.3-0.5 units/mL INR 2-2.5 Monitor platelets by anticoagulation protocol: Yes  Plan:   Continue heparin IV at 1400 units/hr  Daily heparin level & CBC  Monitor closely for s/s of bleeding  Adrian Saran, PharmD, BCPS Pager [phone] 11/12/2014 8:44 PM

## 2014-11-12 NOTE — Progress Notes (Addendum)
[facility] PROGRESS NOTE  Amber Bishop VOJ:[id number] DOB: 1951/01/12 DOA: 11/06/2014 PCP: [name], MD brief narrative  64 y.o. female with PMH of hypertension, diabetes mellitus, depression, anxiety, IBS, arthritis, dilated aortic root, tricuspid aortic valve, pancreatic cancer metastasized to liver (on chemotherapy currently), who presents with RLE cellulitis. In ED, patient was found to have WBC 3.8, platelet 46, LE venous Doppler revealed right lower extremity DVT and left lower extremity SVT. Cellulitis improved. Currently on IV heparin bridge and Coumadin for DVT.  DC home when INR therapeutic.  Assessment/Plan: Acute right lower extremity DVT Possibly triggered by underlying malignancy. On empiric IV heparin drip due to thrombocytopenia and bridging with Coumadin. Oncology Dr. Annamaria Boots consulted and recommends Coumadin with target INR of 2-2.5. INR of 1.06 on 6 day of anticoagulation. Warfarin dose increased to 7.5 mg today.  Right leg cellulitis Initially placed on empiric IV vancomycin and Zosyn. Cultures negative. Cellulitis now resolved. Has purpuric rash and thrombocytopenia. Now patient oral doxycycline to complete a total 7 days of treatment. (Complex antibiotics after 6/25)  Uncontrolled type 2 diabetes mellitus Check A1c. Patient on Lantus 80 units daily and pre-meal aspart. We'll increase pre-meal dose to 10 units 3 times a day.  Anxiety and depression Stable. Continue home medications  Essential hypertension Continue amlodipine and irbesartan.  Pancytopenia Like secondary to chemotherapy. Monitor closely.  Bilateral ankle itching Continue when necessary IV Benadryl. Order for topical   Benadryl cream . Ordered a dose of cholestyramine.   DVT prophylaxis: IV heparin and Coumadin  Diet: Regular    Code Status: Full code Family Communication: None at bedside Disposition Plan: Home once INR therapeutic   Consultants:  Oncology-Dr.  Burr Medico  Procedures:  Right lower extremity Doppler  Antibiotics:  Doxycycline (completes after 6/25)  HPI/Subjective: Seen and examined. Denies leg pain. Complains of itching in her bilateral ankles  Objective: Filed Vitals:   11/12/14 1401  BP: 121/47  Pulse: 89  Temp: 98.6 F (37 C)  Resp: 20    Intake/Output Summary (Last 24 hours) at 11/12/14 1524 Last data filed at 11/12/14 1402  Gross per 24 hour  Intake 1748.48 ml  Output      0 ml  Net 1748.48 ml   Filed Weights   11/06/14 2138  Weight: 98.839 kg (217 lb 14.4 oz)    Exam:   General:  Elderly female lying in bed in no acute distress, has stutturing  Cardiovascular: Normal S1 and S2, no murmurs rub or gallop  Chest: Clear to auscultation bilaterally  GI: Soft, nontender, nondistended, was present  Musculoskeletal: Purpuric reticular rash over right lower extremity, nontender  CNS: Alert and oriented  Data Reviewed: Basic Metabolic Panel:  Recent Labs Lab 11/06/14 1548 11/07/14 0501 11/09/14 0414  NA 132* 137 138  K 4.5 4.0 4.0  CL 103 98* 102  CO2 26 28 28   GLUCOSE 452* 301* 152*  BUN 20 13 11   CREATININE 0.78 0.66 0.59  CALCIUM 7.9* 8.5* 8.5*   Liver Function Tests:  Recent Labs Lab 11/07/14 0501 11/11/14 0500  AST 107* 76*  ALT 229* 121*  ALKPHOS 251* 203*  BILITOT 1.5* 0.5  PROT 6.3* 5.8*  ALBUMIN 3.5 3.0*   No results for input(s): LIPASE, AMYLASE in the last 168 hours. No results for input(s): AMMONIA in the last 168 hours. CBC:  Recent Labs Lab 11/06/14 1548  11/08/14 0500 11/09/14 0414 11/10/14 0442 11/11/14 0500 11/12/14 0522  WBC 3.8*  < > 2.8* 2.3* 3.1* 3.2*  2.8*  NEUTROABS 2.8  --   --   --   --   --   --   HGB 12.0  < > 10.6* 11.8* 12.5 12.4 11.8*  HCT 36.7  < > 34.3* 36.5 38.3 38.9 37.5  MCV 89.7  < > 89.3 89.0 88.0 89.6 88.2  PLT 46*  < > 34* 34* 44* 64* 70*  < > = values in this interval not displayed. Cardiac Enzymes: No results for input(s):  CKTOTAL, CKMB, CKMBINDEX, TROPONINI in the last 168 hours. BNP (last 3 results) No results for input(s): BNP in the last 8760 hours.  ProBNP (last 3 results) No results for input(s): PROBNP in the last 8760 hours.  CBG:  Recent Labs Lab 11/11/14 1142 11/11/14 1655 11/11/14 2151 11/12/14 0735 11/12/14 1142  GLUCAP 222* 260* 191* 205* 198*    Recent Results (from the past 240 hour(s))  Culture, blood (x 2)     Status: None (Preliminary result)   Collection Time: 11/06/14  9:50 PM  Result Value Ref Range Status   Specimen Description BLOOD RIGHT ARM  Final   Special Requests BOTTLES DRAWN AEROBIC AND ANAEROBIC 8CC  Final   Culture   Final    NO GROWTH 4 DAYS Performed at [facility]    Report Status PENDING  Incomplete  Culture, blood (x 2)     Status: None (Preliminary result)   Collection Time: 11/06/14  9:54 PM  Result Value Ref Range Status   Specimen Description BLOOD LEFT HAND  Final   Special Requests BOTTLES DRAWN AEROBIC ONLY [place]  Final   Culture   Final    NO GROWTH 4 DAYS Performed at [facility]    Report Status PENDING  Incomplete     Studies: No results found.  Scheduled Meds: . amLODipine  5 mg Oral q morning - 10a  . calcium-vitamin D  1 tablet Oral BID  . doxycycline  100 mg Oral Q12H  . feeding supplement (GLUCERNA SHAKE)  237 mL Oral BID BM  . FLUoxetine  20 mg Oral q morning - 10a  . insulin aspart  0-20 Units Subcutaneous TID WC  . insulin aspart  0-5 Units Subcutaneous QHS  . insulin aspart  6 Units Subcutaneous TID WC  . insulin glargine  80 Units Subcutaneous QHS  . irbesartan  300 mg Oral Daily  . loratadine  10 mg Oral Daily  . multivitamin with minerals  1 tablet Oral Daily  . omega-3 acid ethyl esters  1 g Oral Daily  . sodium chloride  3 mL Intravenous Q12H  . warfarin  7.5 mg Oral ONCE-1800  . Warfarin - Pharmacist Dosing Inpatient   Does not apply q1800   Continuous Infusions: . heparin 1,400 Units/hr  (11/12/14 3704)       Time spent: 25 minutes    Conswella Bruney, [place] [facility] Pager [phone] If 7PM-7AM, please contact night-coverage at [email or web address], password [facility] 11/12/2014, 3:24 PM  LOS: 6 days

## 2014-11-12 NOTE — Progress Notes (Signed)
ANTICOAGULATION CONSULT NOTE - Follow Up Consult  Pharmacy Consult for Heparin Indication: DVT  Allergies  Allergen Reactions  . Naprosyn [Naproxen] Nausea And Vomiting    Very nauseated but no vomiting.  . Metformin And Related Diarrhea    Pt has IBS     Patient Measurements: Height: 5\' 4"  (162.6 cm) Weight: 217 lb 14.4 oz (98.839 kg) IBW/kg (Calculated) : 54.7 Heparin Dosing Weight:   Vital Signs: Temp: 98.2 F (36.8 C) (06/24 0532) Temp Source: Oral (06/24 0532) BP: 154/72 mmHg (06/24 0532) Pulse Rate: 88 (06/24 0532)  Labs:  Recent Labs  11/10/14 0442 11/11/14 0500 11/12/14 0522  HGB 12.5 12.4 11.8*  HCT 38.3 38.9 37.5  PLT 44* 64* 70*  LABPROT 13.9 13.8 14.0  INR 1.05 1.04 1.06  HEPARINUNFRC 0.48 0.42 0.27*    Estimated Creatinine Clearance: 80 mL/min (by C-G formula based on Cr of 0.59).   Medications:  Infusions:  . heparin      Assessment: Patient with heparin level just below goal.  No heparin issues noted per RN.  Goal of Therapy:  Heparin level 0.3-0.7 units/ml Monitor platelets by anticoagulation protocol: Yes   Plan:  Increase heparin to 1400 units/hr Recheck level at [address] [place] 11/12/2014,6:03 AM

## 2014-11-12 NOTE — Progress Notes (Signed)
Inpatient Diabetes Program Recommendations  AACE/ADA: New Consensus Statement on Inpatient Glycemic Control (2013)  Target Ranges:  Prepandial:   less than 140 mg/dL      Peak postprandial:   less than 180 mg/dL (1-2 hours)      Critically ill patients:  140 - 180 mg/dL   Reason for Visit: Hyperglycemia  Results for LILYMARIE, SCROGGINS (MRN [id number]) as of 11/12/2014 10:33  Ref. Range 11/11/2014 11:42 11/11/2014 16:55 11/11/2014 21:51 11/12/2014 07:35  Glucose-Capillary Latest Ref Range: 65-99 mg/dL 222 (H) 260 (H) 191 (H) 205 (H)    Inpatient Diabetes Program Recommendations Insulin - Meal Coverage: Increase Novolog to 10 units tidwc (on 14-18 units at home) HgbA1C: Need updated HgbA1C to assess glycemic control  Note: Will follow. Thank you. Lorenda Peck, RD, LDN, CDE Inpatient Diabetes Coordinator [phone]

## 2014-11-13 DIAGNOSIS — I82401 Acute embolism and thrombosis of unspecified deep veins of right lower extremity: Principal | ICD-10-CM

## 2014-11-13 LAB — CBC
HEMATOCRIT: 38.8 % (ref 36.0–46.0)
Hemoglobin: 12.3 g/dL (ref 12.0–15.0)
MCH: 28.4 pg (ref 26.0–34.0)
MCHC: 31.7 g/dL (ref 30.0–36.0)
MCV: 89.6 fL (ref 78.0–100.0)
PLATELETS: 105 10*3/uL — AB (ref 150–400)
RBC: 4.33 MIL/uL (ref 3.87–5.11)
RDW: 17 % — ABNORMAL HIGH (ref 11.5–15.5)
WBC: 3.2 10*3/uL — ABNORMAL LOW (ref 4.0–10.5)

## 2014-11-13 LAB — GLUCOSE, CAPILLARY
GLUCOSE-CAPILLARY: 155 mg/dL — AB (ref 65–99)
Glucose-Capillary: 187 mg/dL — ABNORMAL HIGH (ref 65–99)
Glucose-Capillary: 242 mg/dL — ABNORMAL HIGH (ref 65–99)
Glucose-Capillary: 181 mg/dL — ABNORMAL HIGH (ref 65–99)

## 2014-11-13 LAB — HEMOGLOBIN A1C
HEMOGLOBIN A1C: 9.7 % — AB (ref 4.8–5.6)
Mean Plasma Glucose: 232 mg/dL

## 2014-11-13 LAB — HEPARIN LEVEL (UNFRACTIONATED): HEPARIN UNFRACTIONATED: 0.49 [IU]/mL (ref 0.30–0.70)

## 2014-11-13 LAB — PROTIME-INR
INR: 1.15 (ref 0.00–1.49)
PROTHROMBIN TIME: 14.9 s (ref 11.6–15.2)

## 2014-11-13 MED ORDER — INSULIN ASPART 100 UNIT/ML ~~LOC~~ SOLN
11.0000 [IU] | Freq: Three times a day (TID) | SUBCUTANEOUS | Status: DC
  Administered 2014-11-13 – 2014-11-15 (×6): 11 [IU] via SUBCUTANEOUS

## 2014-11-13 MED ORDER — WARFARIN SODIUM 2.5 MG PO TABS
7.5000 mg | ORAL_TABLET | Freq: Once | ORAL | Status: AC
Start: 2014-11-13 — End: 2014-11-13
  Administered 2014-11-13: 7.5 mg via ORAL
  Filled 2014-11-13 (×2): qty 1

## 2014-11-13 NOTE — Progress Notes (Signed)
[facility] PROGRESS NOTE  Amber Bishop RAQ:[id number] DOB: 05-20-1951 DOA: 11/06/2014 PCP: [name], MD brief narrative  64 y.o. female with PMH of hypertension, diabetes mellitus, depression, anxiety, IBS, arthritis, dilated aortic root, tricuspid aortic valve, pancreatic cancer metastasized to liver (on chemotherapy currently), who presents with RLE cellulitis. In ED, patient was found to have WBC 3.8, platelet 46, LE venous Doppler revealed right lower extremity DVT and left lower extremity SVT. Cellulitis improved. Currently on IV heparin bridge and Coumadin for DVT.  DC home when INR therapeutic.  Assessment/Plan: Acute right lower extremity DVT Possibly triggered by underlying malignancy. On empiric IV heparin drip due to thrombocytopenia and bridging with Coumadin. Oncology Dr. Annamaria Boots consulted and recommends Coumadin with target INR of 2-2.5. INR of 1. 15 on 6 day of anticoagulation. Warfarin dose increased to 7.5 mg today.  Right leg cellulitis Initially placed on empiric IV vancomycin and Zosyn. Cultures negative. Cellulitis now resolved. Has purpuric rash and thrombocytopenia. Now patient oral doxycycline to complete a total 7 days of treatment. (complete antibiotics after 6/25)  Uncontrolled type 2 diabetes mellitus A1c is 9.7.  Patient on Lantus 80 units daily and pre-meal aspart. We'll increase pre-meal dose to 11 units 3 times a day. CBG (last 3)   Recent Labs  11/12/14 2107 11/13/14 0729 11/13/14 1147  GLUCAP 216* 187* 242*      Anxiety and depression Stable. Continue home medications  Essential hypertension Well controlled.  Continue amlodipine and irbesartan.  Pancytopenia Like secondary to chemotherapy. Monitor closely. Improving   Bilateral ankle itching Continue when necessary IV Benadryl. Order for topical   Benadryl cream . Ordered a dose of cholestyramine.   DVT prophylaxis: IV heparin and Coumadin  Diet: Regular    Code Status:  Full code Family Communication: None at bedside Disposition Plan: Home once INR therapeutic   Consultants:  Oncology-Dr. Burr Medico  Procedures:  Right lower extremity Doppler  Antibiotics:  Doxycycline (completes after 6/25)  HPI/Subjective: Seen and examined. She reports feeling better today but is scared to go home by herself.   Objective: Filed Vitals:   11/13/14 1100  BP: 126/70  Pulse: 90  Temp: 98.1 F (36.7 C)  Resp: 18    Intake/Output Summary (Last 24 hours) at 11/13/14 1526 Last data filed at 11/13/14 1300  Gross per 24 hour  Intake    704 ml  Output      0 ml  Net    704 ml   Filed Weights   11/06/14 2138  Weight: 98.839 kg (217 lb 14.4 oz)    Exam:   General:  Elderly female lying in bed in no acute distress, has stutturing  Cardiovascular: Normal S1 and S2, no murmurs rub or gallop  Chest: Clear to auscultation bilaterally  GI: Soft, nontender, nondistended, was present  Musculoskeletal: Purpuric reticular rash over right lower extremity, nontender  CNS: Alert and oriented  Data Reviewed: Basic Metabolic Panel:  Recent Labs Lab 11/06/14 1548 11/07/14 0501 11/09/14 0414  NA 132* 137 138  K 4.5 4.0 4.0  CL 103 98* 102  CO2 26 28 28   GLUCOSE 452* 301* 152*  BUN 20 13 11   CREATININE 0.78 0.66 0.59  CALCIUM 7.9* 8.5* 8.5*   Liver Function Tests:  Recent Labs Lab 11/07/14 0501 11/11/14 0500  AST 107* 76*  ALT 229* 121*  ALKPHOS 251* 203*  BILITOT 1.5* 0.5  PROT 6.3* 5.8*  ALBUMIN 3.5 3.0*   No results for input(s): LIPASE, AMYLASE in the  last 168 hours. No results for input(s): AMMONIA in the last 168 hours. CBC:  Recent Labs Lab 11/06/14 1548  11/09/14 0414 11/10/14 0442 11/11/14 0500 11/12/14 0522 11/13/14 0455  WBC 3.8*  < > 2.3* 3.1* 3.2* 2.8* 3.2*  NEUTROABS 2.8  --   --   --   --   --   --   HGB 12.0  < > 11.8* 12.5 12.4 11.8* 12.3  HCT 36.7  < > 36.5 38.3 38.9 37.5 38.8  MCV 89.7  < > 89.0 88.0 89.6 88.2  89.6  PLT 46*  < > 34* 44* 64* 70* 105*  < > = values in this interval not displayed. Cardiac Enzymes: No results for input(s): CKTOTAL, CKMB, CKMBINDEX, TROPONINI in the last 168 hours. BNP (last 3 results) No results for input(s): BNP in the last 8760 hours.  ProBNP (last 3 results) No results for input(s): PROBNP in the last 8760 hours.  CBG:  Recent Labs Lab 11/12/14 1142 11/12/14 1622 11/12/14 2107 11/13/14 0729 11/13/14 1147  GLUCAP 198* 311* 216* 187* 242*    Recent Results (from the past 240 hour(s))  Culture, blood (x 2)     Status: None   Collection Time: 11/06/14  9:50 PM  Result Value Ref Range Status   Specimen Description BLOOD RIGHT ARM  Final   Special Requests BOTTLES DRAWN AEROBIC AND ANAEROBIC [place]  Final   Culture   Final    NO GROWTH 5 DAYS Performed at [facility]    Report Status 11/12/2014 FINAL  Final  Culture, blood (x 2)     Status: None   Collection Time: 11/06/14  9:54 PM  Result Value Ref Range Status   Specimen Description BLOOD LEFT HAND  Final   Special Requests BOTTLES DRAWN AEROBIC ONLY [place]  Final   Culture   Final    NO GROWTH 5 DAYS Performed at [facility]    Report Status 11/12/2014 FINAL  Final     Studies: No results found.  Scheduled Meds: . amLODipine  5 mg Oral q morning - 10a  . calcium-vitamin D  1 tablet Oral BID  . doxycycline  100 mg Oral Q12H  . feeding supplement (GLUCERNA SHAKE)  237 mL Oral BID BM  . FLUoxetine  20 mg Oral q morning - 10a  . insulin aspart  0-20 Units Subcutaneous TID WC  . insulin aspart  0-5 Units Subcutaneous QHS  . insulin aspart  10 Units Subcutaneous TID WC  . insulin glargine  80 Units Subcutaneous QHS  . irbesartan  300 mg Oral Daily  . loratadine  10 mg Oral Daily  . multivitamin with minerals  1 tablet Oral Daily  . omega-3 acid ethyl esters  1 g Oral Daily  . sodium chloride  3 mL Intravenous Q12H  . warfarin  7.5 mg Oral ONCE-1800  . Warfarin - Pharmacist  Dosing Inpatient   Does not apply q1800   Continuous Infusions: . heparin 1,400 Units/hr (11/13/14 1405)       Time spent: 25 minutes    [place] [facility] Pager [phone] If 7PM-7AM, please contact night-coverage at [email or web address], password [facility] 11/13/2014, 3:26 PM  LOS: 7 days

## 2014-11-13 NOTE — Progress Notes (Signed)
Wrong person she is room air

## 2014-11-13 NOTE — Progress Notes (Signed)
ANTICOAGULATION CONSULT NOTE - Follow Up Consult  Pharmacy Consult for Heparin Indication: DVT  Allergies  Allergen Reactions  . Naprosyn [Naproxen] Nausea And Vomiting    Very nauseated but no vomiting.  . Metformin And Related Diarrhea    Pt has IBS     Patient Measurements: Height: 5\' 4"  (162.6 cm) Weight: 217 lb 14.4 oz (98.839 kg) IBW/kg (Calculated) : 54.7 Heparin Dosing Weight:   Vital Signs: Temp: 98 F (36.7 C) (06/25 0502) Temp Source: Oral (06/25 0502) BP: 131/73 mmHg (06/25 0502) Pulse Rate: 83 (06/25 0502)  Labs:  Recent Labs  11/11/14 0500 11/12/14 0522 11/12/14 1300 11/12/14 2005 11/13/14 0455  HGB 12.4 11.8*  --   --  12.3  HCT 38.9 37.5  --   --  38.8  PLT 64* 70*  --   --  105*  LABPROT 13.8 14.0  --   --  14.9  INR 1.04 1.06  --   --  1.15  HEPARINUNFRC 0.42 0.27* 0.44 0.37 0.49    Estimated Creatinine Clearance: 80 mL/min (by C-G formula based on Cr of 0.59).   Medications:  Infusions:  . heparin 1,400 Units/hr (11/12/14 1758)    Assessment: Patient with heparin level at the reduced goal (0.3-0.5).  No heparin issues noted.  Goal of Therapy:  Heparin level 0.3-0.5 units/ml Monitor platelets by anticoagulation protocol: Yes   Plan:  Continue heparin drip at current rate Recheck level with 6/26 AM labs  Nani Skillern Crowford 11/13/2014,6:03 AM

## 2014-11-13 NOTE — Progress Notes (Signed)
[place] for Heparin, Warfarin Indication: DVT  Allergies  Allergen Reactions  . Naprosyn [Naproxen] Nausea And Vomiting    Very nauseated but no vomiting.  . Metformin And Related Diarrhea    Pt has IBS     Patient Measurements: Height: 5\' 4"  (162.6 cm) Weight: 217 lb 14.4 oz (98.839 kg) IBW/kg (Calculated) : 54.7 Heparin Dosing Weight: 77 kg  Vital Signs: Temp: 98 F (36.7 C) (06/25 0502) Temp Source: Oral (06/25 0502) BP: 131/73 mmHg (06/25 0502) Pulse Rate: 83 (06/25 0502)  Labs:  Recent Labs  11/11/14 0500 11/12/14 0522 11/12/14 1300 11/12/14 2005 11/13/14 0455  HGB 12.4 11.8*  --   --  12.3  HCT 38.9 37.5  --   --  38.8  PLT 64* 70*  --   --  105*  LABPROT 13.8 14.0  --   --  14.9  INR 1.04 1.06  --   --  1.15  HEPARINUNFRC 0.42 0.27* 0.44 0.37 0.49    Estimated Creatinine Clearance: 80 mL/min (by C-G formula based on Cr of 0.59).  Medications:  Scheduled:  . amLODipine  5 mg Oral q morning - 10a  . calcium-vitamin D  1 tablet Oral BID  . doxycycline  100 mg Oral Q12H  . feeding supplement (GLUCERNA SHAKE)  237 mL Oral BID BM  . FLUoxetine  20 mg Oral q morning - 10a  . insulin aspart  0-20 Units Subcutaneous TID WC  . insulin aspart  0-5 Units Subcutaneous QHS  . insulin aspart  10 Units Subcutaneous TID WC  . insulin glargine  80 Units Subcutaneous QHS  . irbesartan  300 mg Oral Daily  . loratadine  10 mg Oral Daily  . multivitamin with minerals  1 tablet Oral Daily  . omega-3 acid ethyl esters  1 g Oral Daily  . sodium chloride  3 mL Intravenous Q12H  . Warfarin - Pharmacist Dosing Inpatient   Does not apply q1800   Infusions:  . heparin 1,400 Units/hr (11/12/14 1758)    Assessment: 64 yr female presented to ED on 6/18 with RLE cellulitis and swelling.  PMH significant for metastatic pancreatic cancer undergoing chemotherapy.  Pt s/p fall 2 weeks ago.  Doppler showed + DVT in RLE, and superficial  thrombus in LLE.    6/18: Pharmacy was initially consulted to dose Lovenox for treatment of DVT.  Platelet count noted to be 46.  RPh spoke with Dr Blaine Hamper and received orders to initiate anticoagulation with IV heparin (d/c lovenox order).  6/21: Discussed with Dr. Burr Medico (Oncology)-recommends bridging over to warfarin today since platelet count has not dropped further. She recommends lower INR goal of 2-2.5 and lower heparin level goal of 0.3-0.5 units/mL. Discussed with Dr. Algis Liming [facility]) as well, who recommends starting warfarin per pharmacy consult as per Oncology guidance.   Today, 11/13/2014:  Heparin level = 0.49, upper end of therapeutic range on heparin 1400 units/hr   INR 1.15, remains subtherapeutic but increased   CBC:  Hgb relatively stable at 12.3, Plt increased to 105  No active bleeding issues currently reported per nursing.  LFTs elevated, but improved (last on 6/23)  Drug Interactions: Doxycycline (6/21-6/25), Fluoxetine  Diet: Carb-modified, eating 75-100% of meals  Goal of Therapy:  Heparin level 0.3-0.5 units/mL INR 2-2.5 Monitor platelets by anticoagulation protocol: Yes   Plan:  Warfarin 7.5 mg PO once today at 1800 Continue heparin IV infusion at 1400 units/hr Daily heparin level, INR, and CBC  Continue to monitor H&H and platelets  Monitor closely for s/s of bleeding.   Gretta Arab PharmD, BCPS Pager [phone] 11/13/2014 8:00 AM

## 2014-11-14 LAB — CBC
HEMATOCRIT: 38.4 % (ref 36.0–46.0)
HEMOGLOBIN: 12.1 g/dL (ref 12.0–15.0)
MCH: 28.3 pg (ref 26.0–34.0)
MCHC: 31.5 g/dL (ref 30.0–36.0)
MCV: 89.9 fL (ref 78.0–100.0)
Platelets: 123 10*3/uL — ABNORMAL LOW (ref 150–400)
RBC: 4.27 MIL/uL (ref 3.87–5.11)
RDW: 16.9 % — AB (ref 11.5–15.5)
WBC: 3 10*3/uL — ABNORMAL LOW (ref 4.0–10.5)

## 2014-11-14 LAB — GLUCOSE, CAPILLARY
GLUCOSE-CAPILLARY: 122 mg/dL — AB (ref 65–99)
GLUCOSE-CAPILLARY: 199 mg/dL — AB (ref 65–99)
Glucose-Capillary: 116 mg/dL — ABNORMAL HIGH (ref 65–99)
Glucose-Capillary: 235 mg/dL — ABNORMAL HIGH (ref 65–99)

## 2014-11-14 LAB — HEPARIN LEVEL (UNFRACTIONATED)
HEPARIN UNFRACTIONATED: 0.56 [IU]/mL (ref 0.30–0.70)
Heparin Unfractionated: 0.67 IU/mL (ref 0.30–0.70)
Heparin Unfractionated: 0.49 IU/mL (ref 0.30–0.70)

## 2014-11-14 LAB — PROTIME-INR
INR: 1.3 (ref 0.00–1.49)
Prothrombin Time: 16.3 seconds — ABNORMAL HIGH (ref 11.6–15.2)

## 2014-11-14 MED ORDER — HEPARIN (PORCINE) IN NACL 100-0.45 UNIT/ML-% IJ SOLN
1350.0000 [IU]/h | INTRAMUSCULAR | Status: DC
  Administered 2014-11-14 (×2): 1350 [IU]/h via INTRAVENOUS
  Filled 2014-11-14: qty 250

## 2014-11-14 MED ORDER — HEPARIN (PORCINE) IN NACL 100-0.45 UNIT/ML-% IJ SOLN
1300.0000 [IU]/h | INTRAMUSCULAR | Status: DC
  Administered 2014-11-15: 1300 [IU]/h via INTRAVENOUS
  Filled 2014-11-14: qty 250

## 2014-11-14 MED ORDER — WARFARIN SODIUM 5 MG PO TABS
7.5000 mg | ORAL_TABLET | Freq: Once | ORAL | Status: AC
  Administered 2014-11-14: 7.5 mg via ORAL
  Filled 2014-11-14 (×2): qty 1

## 2014-11-14 NOTE — Progress Notes (Signed)
[facility] PROGRESS NOTE  Amber Bishop:[id number] DOB: January 18, 1951 DOA: 11/06/2014 PCP: [name], MD brief narrative  64 y.o. female with PMH of hypertension, diabetes mellitus, depression, anxiety, IBS, arthritis, dilated aortic root, tricuspid aortic valve, pancreatic cancer metastasized to liver (on chemotherapy currently), who presents with RLE cellulitis. In ED, patient was found to have WBC 3.8, platelet 46, LE venous Doppler revealed right lower extremity DVT and left lower extremity SVT. Cellulitis improved. Currently on IV heparin bridge and Coumadin for DVT.  DC home when INR therapeutic.  Assessment/Plan: Acute right lower extremity DVT Possibly triggered by underlying malignancy. On empiric IV heparin drip due to thrombocytopenia and bridging with Coumadin. Oncology Dr. Annamaria Boots consulted and recommends Coumadin with target INR of 2-2.5. INR of 1. 3  Today. Warfarin dose increased to 7.5 mg today. Patient does not want to use lovenox injections.   Right leg cellulitis Initially placed on empiric IV vancomycin and Zosyn. Cultures negative. Cellulitis now resolved. Has purpuric rash and thrombocytopenia. Now patient oral doxycycline to complete a total 7 days of treatment. (complete antibiotics after 6/25)  Uncontrolled type 2 diabetes mellitus A1c is 9.7.  Patient on Lantus 80 units daily and pre-meal aspart. We'll increase pre-meal dose to 11 units 3 times a day. CBG (last 3)   Recent Labs  11/14/14 0734 11/14/14 1156 11/14/14 1648  GLUCAP 116* 122* 235*      Anxiety and depression Stable. Continue home medications  Essential hypertension Well controlled.  Continue amlodipine and irbesartan.  Pancytopenia Like secondary to chemotherapy. Monitor closely. Improving   Bilateral ankle itching Continue when necessary IV Benadryl. Order for topical   Benadryl cream . Ordered a dose of cholestyramine.   DVT prophylaxis: IV heparin and Coumadin  Diet:  Regular    Code Status: Full code Family Communication: None at bedside Disposition Plan: Home once INR therapeutic   Consultants:  Oncology-Dr. Burr Medico  Procedures:  Right lower extremity Doppler  Antibiotics:  Doxycycline (completes after 6/25)  HPI/Subjective: Seen and examined. She reports feeling better today but is scared to go home by herself. Does not want lovenox injections to go home .   Objective: Filed Vitals:   11/14/14 1431  BP: 109/69  Pulse: 87  Temp: 98 F (36.7 C)  Resp: 18    Intake/Output Summary (Last 24 hours) at 11/14/14 1713 Last data filed at 11/14/14 1300  Gross per 24 hour  Intake    360 ml  Output      0 ml  Net    360 ml   Filed Weights   11/06/14 2138  Weight: 98.839 kg (217 lb 14.4 oz)    Exam:   General:  Elderly female lying in bed in no acute distress, has stutturing  Cardiovascular: Normal S1 and S2, no murmurs rub or gallop  Chest: Clear to auscultation bilaterally  GI: Soft, nontender, nondistended, was present  Musculoskeletal: Purpuric reticular rash over right lower extremity, nontender  CNS: Alert and oriented  Data Reviewed: Basic Metabolic Panel:  Recent Labs Lab 11/09/14 0414  NA 138  K 4.0  CL 102  CO2 28  GLUCOSE 152*  BUN 11  CREATININE 0.59  CALCIUM 8.5*   Liver Function Tests:  Recent Labs Lab 11/11/14 0500  AST 76*  ALT 121*  ALKPHOS 203*  BILITOT 0.5  PROT 5.8*  ALBUMIN 3.0*   No results for input(s): LIPASE, AMYLASE in the last 168 hours. No results for input(s): AMMONIA in the last 168 hours.  CBC:  Recent Labs Lab 11/10/14 0442 11/11/14 0500 11/12/14 0522 11/13/14 0455 11/14/14 0435  WBC 3.1* 3.2* 2.8* 3.2* 3.0*  HGB 12.5 12.4 11.8* 12.3 12.1  HCT 38.3 38.9 37.5 38.8 38.4  MCV 88.0 89.6 88.2 89.6 89.9  PLT 44* 64* 70* 105* 123*   Cardiac Enzymes: No results for input(s): CKTOTAL, CKMB, CKMBINDEX, TROPONINI in the last 168 hours. BNP (last 3 results) No results  for input(s): BNP in the last 8760 hours.  ProBNP (last 3 results) No results for input(s): PROBNP in the last 8760 hours.  CBG:  Recent Labs Lab 11/13/14 1657 11/13/14 2231 11/14/14 0734 11/14/14 1156 11/14/14 1648  GLUCAP 155* 181* 116* 122* 235*    Recent Results (from the past 240 hour(s))  Culture, blood (x 2)     Status: None   Collection Time: 11/06/14  9:50 PM  Result Value Ref Range Status   Specimen Description BLOOD RIGHT ARM  Final   Special Requests BOTTLES DRAWN AEROBIC AND ANAEROBIC [place]  Final   Culture   Final    NO GROWTH 5 DAYS Performed at [facility]    Report Status 11/12/2014 FINAL  Final  Culture, blood (x 2)     Status: None   Collection Time: 11/06/14  9:54 PM  Result Value Ref Range Status   Specimen Description BLOOD LEFT HAND  Final   Special Requests BOTTLES DRAWN AEROBIC ONLY [place]  Final   Culture   Final    NO GROWTH 5 DAYS Performed at [facility]    Report Status 11/12/2014 FINAL  Final     Studies: No results found.  Scheduled Meds: . amLODipine  5 mg Oral q morning - 10a  . calcium-vitamin D  1 tablet Oral BID  . feeding supplement (GLUCERNA SHAKE)  237 mL Oral BID BM  . FLUoxetine  20 mg Oral q morning - 10a  . insulin aspart  0-20 Units Subcutaneous TID WC  . insulin aspart  0-5 Units Subcutaneous QHS  . insulin aspart  11 Units Subcutaneous TID WC  . insulin glargine  80 Units Subcutaneous QHS  . irbesartan  300 mg Oral Daily  . loratadine  10 mg Oral Daily  . multivitamin with minerals  1 tablet Oral Daily  . omega-3 acid ethyl esters  1 g Oral Daily  . sodium chloride  3 mL Intravenous Q12H  . warfarin  7.5 mg Oral ONCE-1800  . Warfarin - Pharmacist Dosing Inpatient   Does not apply q1800   Continuous Infusions: . heparin         Time spent: 25 minutes    [place] [facility] Pager [phone] If 7PM-7AM, please contact night-coverage at [email or web address], password [facility] 11/14/2014,  5:13 PM  LOS: 8 days

## 2014-11-14 NOTE — Progress Notes (Addendum)
[place] for Heparin, Warfarin Indication: DVT  Allergies  Allergen Reactions  . Naprosyn [Naproxen] Nausea And Vomiting    Very nauseated but no vomiting.  . Metformin And Related Diarrhea    Pt has IBS     Patient Measurements: Height: 5\' 4"  (162.6 cm) Weight: 217 lb 14.4 oz (98.839 kg) IBW/kg (Calculated) : 54.7 Heparin Dosing Weight: 77 kg  Vital Signs: Temp: 97.7 F (36.5 C) (06/26 0502) Temp Source: Oral (06/26 0502) BP: 133/69 mmHg (06/26 0502) Pulse Rate: 87 (06/26 0502)  Labs:  Recent Labs  11/12/14 0522  11/12/14 2005 11/13/14 0455 11/14/14 0435  HGB 11.8*  --   --  12.3 12.1  HCT 37.5  --   --  38.8 38.4  PLT 70*  --   --  105* 123*  LABPROT 14.0  --   --  14.9 16.3*  INR 1.06  --   --  1.15 1.30  HEPARINUNFRC 0.27*  < > 0.37 0.49 0.67  < > = values in this interval not displayed.  Estimated Creatinine Clearance: 80 mL/min (by C-G formula based on Cr of 0.59).  Medications:  Scheduled:  . amLODipine  5 mg Oral q morning - 10a  . calcium-vitamin D  1 tablet Oral BID  . feeding supplement (GLUCERNA SHAKE)  237 mL Oral BID BM  . FLUoxetine  20 mg Oral q morning - 10a  . insulin aspart  0-20 Units Subcutaneous TID WC  . insulin aspart  0-5 Units Subcutaneous QHS  . insulin aspart  11 Units Subcutaneous TID WC  . insulin glargine  80 Units Subcutaneous QHS  . irbesartan  300 mg Oral Daily  . loratadine  10 mg Oral Daily  . multivitamin with minerals  1 tablet Oral Daily  . omega-3 acid ethyl esters  1 g Oral Daily  . sodium chloride  3 mL Intravenous Q12H  . Warfarin - Pharmacist Dosing Inpatient   Does not apply q1800   Infusions:  . heparin 1,400 Units/hr (11/13/14 1405)    Assessment: 64 yr female presented to ED on 6/18 with RLE cellulitis and swelling.  PMH significant for metastatic pancreatic cancer undergoing chemotherapy.  Pt s/p fall 2 weeks ago.  Doppler showed + DVT in RLE, and superficial  thrombus in LLE.    6/18: Pharmacy was initially consulted to dose Lovenox for treatment of DVT.  Platelet count noted to be 46.  RPh spoke with Dr Blaine Hamper and received orders to initiate anticoagulation with IV heparin (d/c lovenox order).  6/21: Discussed with Dr. Burr Medico (Oncology)-recommends bridging over to warfarin today since platelet count has not dropped further. She recommends lower INR goal of 2-2.5 and lower heparin level goal of 0.3-0.5 units/mL. Discussed with Dr. Algis Liming [facility]) as well, who recommends starting warfarin per pharmacy consult as per Oncology guidance.   Today, 11/14/2014:  Heparin level = 0.67, supratherapeutic for lower goal range   INR 1.3, remains subtherapeutic but increased   CBC:  Hgb relatively stable at 12.3, Plt increased to 123  No active bleeding issues currently reported per nursing.  LFTs elevated, but improved (last on 6/23)  Drug Interactions: Fluoxetine   Diet: Carb-modified, eating 75-100% of meals  Goal of Therapy:  Heparin level 0.3-0.5 units/mL INR 2-2.5 Monitor platelets by anticoagulation protocol: Yes   Plan:  Warfarin 7.5 mg PO once today at 1800 Decrease to heparin IV infusion at 1350 units/hr Heparin level 6 hours after rate change Daily heparin  level, INR, and CBC Continue to monitor H&H and platelets  Monitor closely for s/s of bleeding.   Gretta Arab PharmD, BCPS Pager [phone] 11/14/2014 8:06 AM     Addendum: Heparin level = 0.56, supratherapeutic for lower goal range 0.3-0.5 Plan: Decrease to heparin IV infusion at 1300 units/hr Recheck HL in 6 hours.  Gretta Arab PharmD, BCPS Pager [phone] 11/14/2014 3:44 PM

## 2014-11-15 ENCOUNTER — Other Ambulatory Visit: Payer: Self-pay | Admitting: Pharmacist

## 2014-11-15 ENCOUNTER — Other Ambulatory Visit: Payer: Self-pay | Admitting: Hematology

## 2014-11-15 ENCOUNTER — Telehealth: Payer: Self-pay | Admitting: Hematology

## 2014-11-15 ENCOUNTER — Other Ambulatory Visit: Payer: Self-pay

## 2014-11-15 ENCOUNTER — Other Ambulatory Visit: Payer: Self-pay | Admitting: *Deleted

## 2014-11-15 DIAGNOSIS — I82401 Acute embolism and thrombosis of unspecified deep veins of right lower extremity: Secondary | ICD-10-CM | POA: Insufficient documentation

## 2014-11-15 LAB — GLUCOSE, CAPILLARY
GLUCOSE-CAPILLARY: 134 mg/dL — AB (ref 65–99)
Glucose-Capillary: 131 mg/dL — ABNORMAL HIGH (ref 65–99)

## 2014-11-15 LAB — CBC
HCT: 37.9 % (ref 36.0–46.0)
Hemoglobin: 12.1 g/dL (ref 12.0–15.0)
MCH: 28.8 pg (ref 26.0–34.0)
MCHC: 31.9 g/dL (ref 30.0–36.0)
MCV: 90.2 fL (ref 78.0–100.0)
PLATELETS: 156 10*3/uL (ref 150–400)
RBC: 4.2 MIL/uL (ref 3.87–5.11)
RDW: 16.9 % — ABNORMAL HIGH (ref 11.5–15.5)
WBC: 3.2 10*3/uL — AB (ref 4.0–10.5)

## 2014-11-15 LAB — HEPARIN LEVEL (UNFRACTIONATED): Heparin Unfractionated: 0.47 IU/mL (ref 0.30–0.70)

## 2014-11-15 LAB — PROTIME-INR
INR: 1.46 (ref 0.00–1.49)
Prothrombin Time: 17.8 seconds — ABNORMAL HIGH (ref 11.6–15.2)

## 2014-11-15 MED ORDER — ENOXAPARIN SODIUM 100 MG/ML ~~LOC~~ SOLN
100.0000 mg | Freq: Once | SUBCUTANEOUS | Status: DC
Start: 2014-11-16 — End: 2014-11-15

## 2014-11-15 MED ORDER — WARFARIN SODIUM 7.5 MG PO TABS: 7.5000 mg | ORAL_TABLET | Freq: Every day | ORAL | Status: DC

## 2014-11-15 MED ORDER — ENOXAPARIN (LOVENOX) PATIENT EDUCATION KIT
PACK | Status: DC
  Filled 2014-11-15: qty 1

## 2014-11-15 MED ORDER — ENOXAPARIN SODIUM 100 MG/ML ~~LOC~~ SOLN: 100.0000 mg | Freq: Two times a day (BID) | SUBCUTANEOUS | Status: DC

## 2014-11-15 MED ORDER — WARFARIN SODIUM 5 MG PO TABS
10.0000 mg | ORAL_TABLET | Freq: Once | ORAL | Status: AC
  Administered 2014-11-15: 10 mg via ORAL
  Filled 2014-11-15: qty 2

## 2014-11-15 MED ORDER — DIPHENHYDRAMINE-ZINC ACETATE 2-0.1 % EX CREA: TOPICAL_CREAM | Freq: Two times a day (BID) | CUTANEOUS | Status: DC | PRN

## 2014-11-15 MED ORDER — ENOXAPARIN SODIUM 100 MG/ML ~~LOC~~ SOLN
100.0000 mg | Freq: Once | SUBCUTANEOUS | Status: AC
  Administered 2014-11-15: 100 mg via SUBCUTANEOUS
  Filled 2014-11-15: qty 1

## 2014-11-15 NOTE — Patient Outreach (Signed)
I received a phone call from Ms. Labell stating she was going to be discharged today.  She was wondering if I could assist with her Lantus.  I stated she did qualify for the Pharmacy Emergency Fund.  I will purchase her Lantus and deliver it to her hospital bed.  I will follow up in two week to see if she had received a letter from social security in regards to her Extra Help application.    Deanne Coffer, PharmD, [place] [phone]

## 2014-11-15 NOTE — Progress Notes (Signed)
ANTICOAGULATION CONSULT NOTE - Follow Up Consult  Pharmacy Consult for Heparin Indication: DVT  Allergies  Allergen Reactions  . Naprosyn [Naproxen] Nausea And Vomiting    Very nauseated but no vomiting.  . Metformin And Related Diarrhea    Pt has IBS     Patient Measurements: Height: 5\' 4"  (162.6 cm) Weight: 217 lb 14.4 oz (98.839 kg) IBW/kg (Calculated) : 54.7 Heparin Dosing Weight:   Vital Signs: Temp: 98.1 F (36.7 C) (06/27 0514) Temp Source: Oral (06/27 0514) BP: 127/70 mmHg (06/27 0514) Pulse Rate: 76 (06/27 0514)  Labs:  Recent Labs  11/13/14 0455 11/14/14 0435 11/14/14 1421 11/14/14 2215 11/15/14 0413  HGB 12.3 12.1  --   --  12.1  HCT 38.8 38.4  --   --  37.9  PLT 105* 123*  --   --  156  LABPROT 14.9 16.3*  --   --  17.8*  INR 1.15 1.30  --   --  1.46  HEPARINUNFRC 0.49 0.67 0.56 0.49 0.47    Estimated Creatinine Clearance: 80 mL/min (by C-G formula based on Cr of 0.59).   Medications:  Infusions:  . heparin 1,300 Units/hr (11/15/14 0347)    Assessment: Patient with heparin level at goal for 2nd time.  No heparin issues noted.    Goal of Therapy:  Heparin level 0.3-0.5 units/ml Monitor platelets by anticoagulation protocol: Yes   Plan:  Continue heparin drip at current rate Recheck level with 6/28 AM labs  Tyler Deis, Shea Stakes Crowford 11/15/2014,5:35 AM

## 2014-11-15 NOTE — Care Management (Signed)
IM LETTER GIVEN TO PATIENT  

## 2014-11-15 NOTE — Progress Notes (Signed)
Patient to be discharged home today with Lovenox injections and Coumadin.  Spent >1h educating patient on Lovenox injections and Coumadin and when to take these medications.  Patient self administered her Lovenox injection prior to discharge.  All of medications reviewed with patient in depth as well as her follow up appointments.  Patient instructed to call the cancer center in the morning to make appointment with Dr. Burr Medico for Wednesday per [place] RN that I spoke with this afternoon.  After in depth discussion regarding meds and injections, patient felt comfortable going home with them and administering herself for the next couple of days until f/u on Wednesday for labs etc.  Patient caregiver at bedside and expressed understanding of medications and when to take as well.  Will continue to monitor until d/c

## 2014-11-15 NOTE — Progress Notes (Addendum)
[place] for Warfarin, heparin to enoxaparin Indication: DVT  Allergies  Allergen Reactions  . Naprosyn [Naproxen] Nausea And Vomiting    Very nauseated but no vomiting.  . Metformin And Related Diarrhea    Pt has IBS     Patient Measurements: Height: 5\' 4"  (162.6 cm) Weight: 217 lb 14.4 oz (98.839 kg) IBW/kg (Calculated) : 54.7 Heparin Dosing Weight: 77 kg  Vital Signs: Temp: 98.1 F (36.7 C) (06/27 0514) Temp Source: Oral (06/27 0514) BP: 127/70 mmHg (06/27 0514) Pulse Rate: 76 (06/27 0514)  Labs:  Recent Labs  11/13/14 0455 11/14/14 0435 11/14/14 1421 11/14/14 2215 11/15/14 0413  HGB 12.3 12.1  --   --  12.1  HCT 38.8 38.4  --   --  37.9  PLT 105* 123*  --   --  156  LABPROT 14.9 16.3*  --   --  17.8*  INR 1.15 1.30  --   --  1.46  HEPARINUNFRC 0.49 0.67 0.56 0.49 0.47    Estimated Creatinine Clearance: 80 mL/min (by C-G formula based on Cr of 0.59).  Medications:  Scheduled:  . amLODipine  5 mg Oral q morning - 10a  . calcium-vitamin D  1 tablet Oral BID  . feeding supplement (GLUCERNA SHAKE)  237 mL Oral BID BM  . FLUoxetine  20 mg Oral q morning - 10a  . insulin aspart  0-20 Units Subcutaneous TID WC  . insulin aspart  0-5 Units Subcutaneous QHS  . insulin aspart  11 Units Subcutaneous TID WC  . insulin glargine  80 Units Subcutaneous QHS  . irbesartan  300 mg Oral Daily  . loratadine  10 mg Oral Daily  . multivitamin with minerals  1 tablet Oral Daily  . omega-3 acid ethyl esters  1 g Oral Daily  . sodium chloride  3 mL Intravenous Q12H  . Warfarin - Pharmacist Dosing Inpatient   Does not apply q1800   Infusions:  . heparin 1,300 Units/hr (11/15/14 0347)    Assessment: 64 yr female presented to ED on 6/18 with RLE cellulitis and swelling.  PMH significant for metastatic pancreatic cancer undergoing chemotherapy.  Pt s/p fall 2 weeks ago.  Doppler showed + DVT in RLE, and superficial thrombus in LLE.     6/18: Pharmacy was initially consulted to dose Lovenox for treatment of DVT.  Platelet count noted to be 46.  RPh spoke with Dr Blaine Hamper and received orders to initiate anticoagulation with IV heparin (d/c lovenox order).  6/21: Discussed with Dr. Burr Medico (Oncology)-recommends bridging over to warfarin today since platelet count has not dropped further. She recommends lower INR goal of 2-2.5 and lower heparin level goal of 0.3-0.5 units/mL. Discussed with Dr. Algis Liming [facility] [place]) as well, who recommends starting warfarin per pharmacy consult as per Oncology guidance.   Today, 11/15/2014:  Heparin level = 0.47 (therapeutic)  INR 1.46, remains subtherapeutic, slowly trending upward  CBC:  Hgb stable, Pltc improved to WNL  No active bleeding issues documented  LFTs elevated, but improved (last on 6/23)  Drug Interactions: Fluoxetine, cholestyramine (daily PRN) - can bind medications given <6h after dose given  Diet: Carb-modified, eating 75-100% of meals  Goal of Therapy:  Heparin level 0.3-0.5 units/mL (can be more aggressive now platelet count WNL) INR 2-2.5 Monitor platelets by anticoagulation protocol: Yes   Plan:  Day #7 overlap therapy Warfarin 10 mg PO once today at 1800 (INR slow to trend up with 7.5mg  x 3) Continue heparin infusion  at 1300 units/hr Daily heparin level, INR, and CBC Continue to monitor H&H and platelets  Monitor closely for s/s of bleeding.  See addendum below  Doreene Eland, PharmD, BCPS.   Pager: [phone] 11/15/2014 7:58 AM    ADDENDUM:  Orders to change heparin gtt to enoxaparin (platelets improved to WNL)  One hour after heparin off, start enoxaparin 100mg  SQ q12h  Check CBC at least q72h while hospitalized  If discharged, suggest sending home with warfarin 5mg  tabs, taking 1.5 tabs daily until INR follow-up (suggest follow-up on Wed at latest)  Doreene Eland, PharmD, BCPS.   Pager: [phone] 11/15/2014 2:26 PM

## 2014-11-15 NOTE — Progress Notes (Signed)
Amber Bishop   DOB:04/12/1951   VZ#:[id number]   PIR#:[id number]  Subjective:  Amber Bishop is doing well, INR 1.46 this morning. Platelet count back to normal. No bleeding. She feels she is ready to go home.  Objective:  Filed Vitals:   11/15/14 1350  BP: 122/75  Pulse: 88  Temp: 97.9 F (36.6 C)  Resp: 17    Body mass index is 37.38 kg/(m^2).  Intake/Output Summary (Last 24 hours) at 11/15/14 1407 Last data filed at 11/15/14 1352  Gross per 24 hour  Intake   1255 ml  Output      0 ml  Net   1255 ml     Sclerae unicteric  Oropharynx clear  No peripheral adenopathy  Lungs clear -- no rales or rhonchi  Heart regular rate and rhythm  Abdomen benign  MSK no focal spinal tenderness, no peripheral edema  Neuro nonfocal  Skin: (+) Petechia on both lower extremities, more on the right. (+) Diffuse skin erythema on right lower extremity below knee, darker than before   CBG (last 3)   Recent Labs  11/14/14 2121 11/15/14 0708 11/15/14 1204  GLUCAP 199* 134* 131*     Labs:  Lab Results  Component Value Date   WBC 3.2* 11/15/2014   HGB 12.1 11/15/2014   HCT 37.9 11/15/2014   MCV 90.2 11/15/2014   PLT 156 11/15/2014   NEUTROABS 2.8 11/06/2014    @LASTCHEMISTRY @  Urine Studies No results for input(s): UHGB, CRYS in the last 72 hours.  Invalid input(s): UACOL, UAPR, USPG, UPH, UTP, UGL, UKET, UBIL, UNIT, UROB, ULEU, UEPI, UWBC, URBC, UBAC, CAST, UCOM, BILUA  Basic Metabolic Panel:  Recent Labs Lab 11/09/14 0414  NA 138  K 4.0  CL 102  CO2 28  GLUCOSE 152*  BUN 11  CREATININE 0.59  CALCIUM 8.5*   GFR Estimated Creatinine Clearance: 80 mL/min (by C-G formula based on Cr of 0.59). Liver Function Tests:  Recent Labs Lab 11/11/14 0500  AST 76*  ALT 121*  ALKPHOS 203*  BILITOT 0.5  PROT 5.8*  ALBUMIN 3.0*   No results for input(s): LIPASE, AMYLASE in the last 168 hours. No results for input(s): AMMONIA in the last 168 hours. Coagulation profile  Recent  Labs Lab 11/11/14 0500 11/12/14 0522 11/13/14 0455 11/14/14 0435 11/15/14 0413  INR 1.04 1.06 1.15 1.30 1.46    CBC:  Recent Labs Lab 11/11/14 0500 11/12/14 0522 11/13/14 0455 11/14/14 0435 11/15/14 0413  WBC 3.2* 2.8* 3.2* 3.0* 3.2*  HGB 12.4 11.8* 12.3 12.1 12.1  HCT 38.9 37.5 38.8 38.4 37.9  MCV 89.6 88.2 89.6 89.9 90.2  PLT 64* 70* 105* 123* 156   Cardiac Enzymes: No results for input(s): CKTOTAL, CKMB, CKMBINDEX, TROPONINI in the last 168 hours. BNP: Invalid input(s): POCBNP CBG:  Recent Labs Lab 11/14/14 1156 11/14/14 1648 11/14/14 2121 11/15/14 0708 11/15/14 1204  GLUCAP 122* 235* 199* 134* 131*   D-Dimer No results for input(s): DDIMER in the last 72 hours. Hgb A1c  Recent Labs  11/12/14 1600  HGBA1C 9.7*   Lipid Profile No results for input(s): CHOL, HDL, LDLCALC, TRIG, CHOLHDL, LDLDIRECT in the last 72 hours. Thyroid function studies No results for input(s): TSH, T4TOTAL, T3FREE, THYROIDAB in the last 72 hours.  Invalid input(s): FREET3 Anemia work up No results for input(s): VITAMINB12, FOLATE, FERRITIN, TIBC, IRON, RETICCTPCT in the last 72 hours. Microbiology Recent Results (from the past 240 hour(s))  Culture, blood (x 2)     Status:  None   Collection Time: 11/06/14  9:50 PM  Result Value Ref Range Status   Specimen Description BLOOD RIGHT ARM  Final   Special Requests BOTTLES DRAWN AEROBIC AND ANAEROBIC [place]  Final   Culture   Final    NO GROWTH 5 DAYS Performed at [facility]    Report Status 11/12/2014 FINAL  Final  Culture, blood (x 2)     Status: None   Collection Time: 11/06/14  9:54 PM  Result Value Ref Range Status   Specimen Description BLOOD LEFT HAND  Final   Special Requests BOTTLES DRAWN AEROBIC ONLY [place]  Final   Culture   Final    NO GROWTH 5 DAYS Performed at [facility]    Report Status 11/12/2014 FINAL  Final      Studies:  No results found.  Assessment: 64 y.o.  With stage IV diabetic  cancer, under second line chemotherapy currently.  1.  Extensive  No occlusive Right lower extremity DVT 2.  Right lower extremity cellulitis .  3.  Stage IV  pancreatic cancer with liver metastasis 4.   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 Pancytopenia from chemo  6. Abnormal liver function, likely secondary to liver mets     Plan:  -She is agreeable with discharge, Lovenox 100 mg twice a day bridging over until INR therapeutic. I discussed the pharmacy in our cancer center, and we will give her her 3 days supply of Lovenox for free. She will pick up later today after her hospital discharge. -She will return in 2 days to see me and Coumadin clinic   Truitt Merle, MD 11/15/2014  2:07 PM

## 2014-11-15 NOTE — Telephone Encounter (Signed)
Amber Bishop from 4th floor at [facility] unit called re patient being scheduled for chemo tomorrow and wanting to know what the plan is for her having the chemo. Spoke with desk nurse and asked that she call [place]. Patient in room 1430 per [facility].

## 2014-11-16 ENCOUNTER — Other Ambulatory Visit: Payer: PPO

## 2014-11-16 ENCOUNTER — Telehealth: Payer: Self-pay | Admitting: Hematology

## 2014-11-16 ENCOUNTER — Encounter: Payer: Self-pay | Admitting: Pharmacist

## 2014-11-16 ENCOUNTER — Encounter: Payer: PPO | Admitting: Nutrition

## 2014-11-16 ENCOUNTER — Ambulatory Visit: Payer: PPO

## 2014-11-16 NOTE — Progress Notes (Signed)
New coumadin clinic referral.  Pt with DVT diagnosed 11/06/14 and admitted for anticoagulation with metastatic pancreatic cancer and currently on chemo. Heparin started inpt and following coumadin doses given. 6/21-3mg  6/22-3mg  6/23-5mg  6/24-7.5mg  6/25-7.5mg   6/26-7.5mg   6/27-10mg   INR=1.46 Plan for discharge on coumadin 7.5mg  daily and Lovenox 100mg  BID.  To see in coumadin clinic on 11/17/14.

## 2014-11-16 NOTE — Telephone Encounter (Signed)
pt cld statign that she needed to see Dr Burr Medico -sch for 6/29-pt stated will not be here for 6/28 appts-awaiting a call back from desk nurses

## 2014-11-17 ENCOUNTER — Other Ambulatory Visit (HOSPITAL_BASED_OUTPATIENT_CLINIC_OR_DEPARTMENT_OTHER): Payer: PPO

## 2014-11-17 ENCOUNTER — Telehealth: Payer: Self-pay | Admitting: *Deleted

## 2014-11-17 ENCOUNTER — Ambulatory Visit (HOSPITAL_BASED_OUTPATIENT_CLINIC_OR_DEPARTMENT_OTHER): Payer: PPO | Admitting: Pharmacist

## 2014-11-17 ENCOUNTER — Ambulatory Visit (HOSPITAL_COMMUNITY)
Admission: RE | Admit: 2014-11-17 | Discharge: 2014-11-17 | Disposition: A | Discharge status: Home / Self Care | Payer: PPO | Source: Ambulatory Visit | Attending: Nurse Practitioner | Admitting: Nurse Practitioner

## 2014-11-17 ENCOUNTER — Telehealth: Payer: Self-pay | Admitting: Hematology

## 2014-11-17 ENCOUNTER — Ambulatory Visit (HOSPITAL_BASED_OUTPATIENT_CLINIC_OR_DEPARTMENT_OTHER): Payer: PPO | Admitting: Hematology

## 2014-11-17 ENCOUNTER — Other Ambulatory Visit: Payer: Self-pay | Admitting: Nurse Practitioner

## 2014-11-17 ENCOUNTER — Encounter: Payer: Self-pay | Admitting: Hematology

## 2014-11-17 ENCOUNTER — Ambulatory Visit (HOSPITAL_BASED_OUTPATIENT_CLINIC_OR_DEPARTMENT_OTHER): Payer: PPO

## 2014-11-17 VITALS — BP 120/71 | HR 94 | Temp 98.4°F | Resp 18 | Ht 64.0 in | Wt 216.3 lb

## 2014-11-17 DIAGNOSIS — C787 Secondary malignant neoplasm of liver and intrahepatic bile duct: Secondary | ICD-10-CM

## 2014-11-17 DIAGNOSIS — D696 Thrombocytopenia, unspecified: Secondary | ICD-10-CM

## 2014-11-17 DIAGNOSIS — C251 Malignant neoplasm of body of pancreas: Secondary | ICD-10-CM

## 2014-11-17 DIAGNOSIS — I82401 Acute embolism and thrombosis of unspecified deep veins of right lower extremity: Secondary | ICD-10-CM

## 2014-11-17 DIAGNOSIS — C259 Malignant neoplasm of pancreas, unspecified: Secondary | ICD-10-CM

## 2014-11-17 DIAGNOSIS — Z5111 Encounter for antineoplastic chemotherapy: Secondary | ICD-10-CM

## 2014-11-17 LAB — CBC WITH DIFFERENTIAL/PLATELET
BASO%: 2.3 % — AB (ref 0.0–2.0)
Basophils Absolute: 0.1 10*3/uL (ref 0.0–0.1)
EOS%: 6 % (ref 0.0–7.0)
Eosinophils Absolute: 0.2 10*3/uL (ref 0.0–0.5)
HEMATOCRIT: 37.9 % (ref 34.8–46.6)
HGB: 12.3 g/dL (ref 11.6–15.9)
LYMPH%: 28.2 % (ref 14.0–49.7)
MCH: 28.8 pg (ref 25.1–34.0)
MCHC: 32.5 g/dL (ref 31.5–36.0)
MCV: 88.7 fL (ref 79.5–101.0)
MONO#: 0.5 10*3/uL (ref 0.1–0.9)
MONO%: 20.5 % — AB (ref 0.0–14.0)
NEUT%: 43 % (ref 38.4–76.8)
NEUTROS ABS: 1.1 10*3/uL — AB (ref 1.5–6.5)
PLATELETS: 204 10*3/uL (ref 145–400)
RBC: 4.28 10*6/uL (ref 3.70–5.45)
RDW: 17.4 % — ABNORMAL HIGH (ref 11.2–14.5)
WBC: 2.7 10*3/uL — ABNORMAL LOW (ref 3.9–10.3)
lymph#: 0.7 10*3/uL — ABNORMAL LOW (ref 0.9–3.3)

## 2014-11-17 LAB — COMPREHENSIVE METABOLIC PANEL (CC13)
ALT: 103 U/L — AB (ref 0–55)
AST: 77 U/L — AB (ref 5–34)
Albumin: 3.2 g/dL — ABNORMAL LOW (ref 3.5–5.0)
Alkaline Phosphatase: 246 U/L — ABNORMAL HIGH (ref 40–150)
Anion Gap: 10 mEq/L (ref 3–11)
BILIRUBIN TOTAL: 0.56 mg/dL (ref 0.20–1.20)
BUN: 13.8 mg/dL (ref 7.0–26.0)
CO2: 26 mEq/L (ref 22–29)
Calcium: 9.4 mg/dL (ref 8.4–10.4)
Chloride: 106 mEq/L (ref 98–109)
Creatinine: 0.9 mg/dL (ref 0.6–1.1)
EGFR: 67 mL/min/{1.73_m2} — ABNORMAL LOW (ref >90)
Glucose: 106 mg/dl (ref 70–140)
Potassium: 4 mEq/L (ref 3.5–5.1)
SODIUM: 142 meq/L (ref 136–145)
Total Protein: 6 g/dL — ABNORMAL LOW (ref 6.4–8.3)

## 2014-11-17 LAB — POCT INR: INR: 2

## 2014-11-17 LAB — PROTIME-INR
INR: 2 (ref 2.00–3.50)
PROTIME: 24 s — AB (ref 10.6–13.4)

## 2014-11-17 MED ORDER — SODIUM CHLORIDE 0.9 % IV SOLN
Freq: Once | INTRAVENOUS | Status: AC
  Administered 2014-11-17: 11:00:00 via INTRAVENOUS

## 2014-11-17 MED ORDER — HEPARIN SOD (PORK) LOCK FLUSH 100 UNIT/ML IV SOLN
500.0000 [IU] | Freq: Once | INTRAVENOUS | Status: AC | PRN
  Administered 2014-11-17: 500 [IU]
  Filled 2014-11-17: qty 5

## 2014-11-17 MED ORDER — SODIUM CHLORIDE 0.9 % IJ SOLN
10.0000 mL | INTRAMUSCULAR | Status: DC | PRN
  Administered 2014-11-17: 10 mL
  Filled 2014-11-17: qty 10

## 2014-11-17 MED ORDER — SODIUM CHLORIDE 0.9 % IV SOLN
Freq: Once | INTRAVENOUS | Status: AC
  Administered 2014-11-17: 11:00:00 via INTRAVENOUS
  Filled 2014-11-17: qty 4

## 2014-11-17 MED ORDER — SODIUM CHLORIDE 0.9 % IV SOLN
800.0000 mg/m2 | Freq: Once | INTRAVENOUS | Status: AC
  Administered 2014-11-17: 1672 mg via INTRAVENOUS
  Filled 2014-11-17: qty 43.97

## 2014-11-17 NOTE — Progress Notes (Signed)
OK to treat with today's labs 

## 2014-11-17 NOTE — Progress Notes (Signed)
INR within goal today. Hg/Hct: 12.3/37.9, Pltc: 204 Pt took coumadin as instructed. No missed or extra coumadin doses. Usual hospital bruises. No bleeding. The swelling in her right leg is much better. Pt is sleeping and eating better now that she is at home. Pt does not drink alcohol or take herbal supplements. Continue coumadin 7.5mg  daily.  Stop Lovenox per MD instructions.  Recheck INR on 11/19/14; lab at 8:45am and coumadin clinic at [place]. No charge encounter. Pt to be treated with Gemzar today.

## 2014-11-17 NOTE — Telephone Encounter (Signed)
Pt confirmed labs/ov per 06/29 POF, gave pt AVS and Calendar... KJ, sent msg to add chemo °

## 2014-11-17 NOTE — Patient Instructions (Addendum)
Continue coumadin 7.5mg  daily.  Stop Lovenox per MD instructions.  Recheck INR on 11/19/14; lab at 8:45am and coumadin clinic at [place].

## 2014-11-17 NOTE — Progress Notes (Signed)
[place]  [phone] [phone] [place] Note   Patient Care Team: Harlan Stains, MD as PCP - General (Family Medicine) Truitt Merle, MD as Consulting Physician (Hematology and Oncology) Delrae Rend, MD as Consulting Physician (Endocrinology) Tania Ade, RN as Registered Nurse (Oncology) Gaynelle Adu, [facility] as [place] Management (Pharmacist) 11/17/2014  CHIEF COMPLAINTS:  Follow up thrombocytopenia and a newly diagnosed metastatic pancreatic cancer  Oncology History   Pancreatic cancer metastasized to liver   Staging form: Pancreas, AJCC 7th Edition     Clinical: Stage IV (T2, NX, M1) - Unsigned       Pancreatic cancer metastasized to liver   08/10/2014 Imaging CT abdomen showed 5.4 x 3.2 cm pancreatic body mass consistent with pancreatic adenocarcinoma. There is adjacent lymphadenopathy and diffuse  hepatic metastatic disease.   08/17/2014 Pathology Results Liver biopsy showed adenocarcinoma, consistent with pancreatic primary.   08/17/2014 Initial Diagnosis Pancreatic cancer metastasized to liver   08/24/2014 Tumor Marker CA19.9 >140000   08/31/2014 Imaging CT chest was negative for   09/02/2014 - 10/14/2014 Chemotherapy mFOLFOX, 4 cycles    10/26/2014 Progression CT chest, abdomen and pelvis showed probable disease progression in the liver and a stable primary pancreatic body mass.   11/02/2014 -  Chemotherapy Second line chemotherapy with gemcitabine and Abraxane   11/06/2014 - 11/15/2014 Hospital Admission She was admitted for right lower extremity DVT and cellulitis. She was discharged home with Coumadin and Lovenox bridging.    HISTORY OF PRESENTING ILLNESS:  Amber Bishop 64 y.o. female was referred by her primary care physician Dr. Dema Severin for thrombocytopenia. She also was recently diagnosed with metastatic pancreatic cancer.   She has had a chronic thrombocytopenia since 2013. Her plt count has been in the  range of 100-150, but dropped to the range of 80s lately. She denies any signs of bleeding. No history of liver disease.  She has been diabetic for 24 years, and her blood glucose has been out of control in the past one year. She also reports fatigue in the past one year. She denies any significant abdominal pain, jaundice, bloating, or change of her bowel habits. Her appetite has been normal. No nausea or vomiting. She is able to do all selfcare, but she sleeps more than usually during the day. No abdominal pain or bloating. She was found to have abnormal LFTs on routine lab work, and her PCP Dr. Dema Severin, who ordered and abdominal US which lead to a CT scan, which showed a 5.4 x 3.4 cm pancreatic body mass, adjacent lymphadenopathy, and diffuse hepatic metastasis. She underwent ultrasound-guided liver biopsy on 08/17/2014, and the biopsy showed adenocarcinoma consistent with pancreatic primary.   INTERIM HISTORY: Lizandra returns for follow-up. She was recently hospitalized for right DVT and cellulitis. She was treated with IV heparin and bridged over to Coumadin. She was also treated with antibiotics for cellulitis. She went home with Coumadin and Lovenox bridging 2 days ago. She is doing well overall. Denies any pain, nausea, bleeding, or other symptoms. She has good appetite, mild fatigue is stable.  MEDICAL HISTORY:  Past Medical History  Diagnosis Date  . Anxiety   . Hypertension   . IBS (irritable bowel syndrome)   . Hyperplastic colon polyp   . Allergy   . Anemia   . Arthritis   . Diverticulosis   . Dilated aortic root     seen on prior echo but echo 05/2013 showed normal dimensions  .  Bicuspid aortic valve   . LVE (left ventricular enlargement)     mild by echo 1/15 with EF 50-55%  . pancreatic ca w/ liver mets dx'd 07/2014  . Diabetes mellitus without complication     SURGICAL HISTORY: Past Surgical History  Procedure Laterality Date  . Tubal ligation      age 32  . Colonoscopy      . Polypectomy    . Tooth extraction      3 teeth  . Belpharoptosis repair      SOCIAL HISTORY: History   Social History  . Marital Status: Single    Spouse Name: N/A  . Number of Children: 1 son   . Years of Education: N/A   Occupational History  . 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 Smoking status: Never Smoker   . Smokeless tobacco: Never Used  . Alcohol Use: No  . Drug Use: No  . Sexual Activity: Not on file   Other Topics Concern  . Not on file   Social History Narrative    FAMILY HISTORY: Family History  Problem Relation Age of Onset  . Colon cancer Neg Hx   . Esophageal cancer Neg Hx   . Stomach cancer Neg Hx   . Asthma Mother     ALLERGIES:  is allergic to naprosyn and metformin and related.  MEDICATIONS:  Current Outpatient Prescriptions  Medication Sig Dispense Refill  . ALPRAZolam (XANAX) 0.5 MG tablet TAKE 1 TABLET THREE TIMES DAILY IF NEEDED FOR ANXIETY 90 tablet 3  . amLODipine (NORVASC) 5 MG tablet Take 5 mg by mouth every morning.     . blood glucose meter kit and supplies KIT Dispense based on patient and insurance preference. Use up to four times daily as directed. Dx code: E11.65 1 each 0  . Calcium Carbonate-Vitamin D (CALCIUM 600/VITAMIN D) 600-400 MG-UNIT per tablet Take 1 tablet by mouth 2 (two) times daily.     . cetirizine (ZYRTEC) 10 MG tablet Take 10 mg by mouth daily.    . cholestyramine (QUESTRAN) 4 G packet Take 1/2-1 packet dissolved in water/juice once daily....................Marland KitchenPRN (Patient taking differently: Take 2-4 g by mouth daily as needed (IBS). ) 90 each 3  . dextromethorphan (DELSYM) 30 MG/5ML liquid Take 15 mg by mouth 2 (two) times daily as needed for cough.    . diphenhydrAMINE-zinc acetate (BENADRYL) cream Apply topically 2 (two) times daily as needed for itching. 28.4 g 0  . enoxaparin (LOVENOX) 100 MG/ML injection Inject 1 mL (100 mg total) into the skin every 12 (twelve) hours. 3 Syringe 0  .  FLUoxetine (PROZAC) 20 MG capsule Take 20 mg by mouth every morning.     Marland Kitchen glucose blood (ACCU-CHEK AVIVA PLUS) test strip Test 4 times daily as instructed. 125 each 3  . insulin aspart (NOVOLOG FLEXPEN) 100 UNIT/ML FlexPen Inject 14-18 units into the skin 3 times daily as instructed. (Patient taking differently: Inject 20-28 Units into the skin 3 (three) times daily with meals. ) 15 mL 2  . insulin glargine (LANTUS) 100 UNIT/ML injection Inject 74 Units into the skin at bedtime.     . Insulin Pen Needle (VALUMARK PEN NEEDLES) 31G X 8 MM MISC Use to inject insulin 4 times daily as instructed. 150 each 3  . LANCETS ULTRA THIN 30G MISC Use to test blood sugar 4 times daily as instructed. Dx code: E11.65 200 each 2  . lidocaine-prilocaine (EMLA) cream Apply 1  application topically as needed. 30 g 3  . Multiple Vitamins-Minerals (CENTRUM SILVER PO) Take 1 capsule by mouth every morning.     . Omega-3 Fatty Acids (FISH OIL) 1000 MG CAPS Take 1 capsule by mouth every morning.     Marland Kitchen OVER THE COUNTER MEDICATION Apply 1 drop topically 3 (three) times daily. Essential Oils.    . valsartan-hydrochlorothiazide (DIOVAN-HCT) 320-12.5 MG per tablet Take 1 tablet by mouth every morning.     . warfarin (COUMADIN) 7.5 MG tablet Take 1 tablet (7.5 mg total) by mouth daily. 30 tablet 1   No current facility-administered medications for this visit.    REVIEW OF SYSTEMS:   Constitutional: Denies fevers, chills or abnormal night sweats Eyes: Denies blurriness of vision, double vision or watery eyes Ears, nose, mouth, throat, and face: Denies mucositis or sore throat Respiratory: Denies cough, dyspnea or wheezes Cardiovascular: Denies palpitation, chest discomfort or lower extremity swelling Gastrointestinal:  Denies nausea, heartburn or change in bowel habits Skin: Denies abnormal skin rashes Lymphatics: Denies new lymphadenopathy or easy bruising Neurological:Denies numbness, tingling or new  weaknesses Behavioral/Psych: Mood is stable, no new changes  All other systems were reviewed with the patient and are negative.   PHYSICAL EXAMINATION: ECOG PERFORMANCE STATUS: 1 - Symptomatic but completely ambulatory  Filed Vitals:   11/17/14 0936  BP: 120/71  Pulse: 94  Temp: 98.4 F (36.9 C)  Resp: 18   Filed Weights   11/17/14 0936  Weight: 216 lb 4.8 oz (98.113 kg)    GENERAL:alert, no distress and comfortable SKIN: skin color, texture, turgor are normal, no rashes or significant lesions EYES: normal, conjunctiva are pink and non-injected, sclera clear OROPHARYNX:no exudate, no erythema and lips, buccal mucosa, and tongue normal  NECK: supple, thyroid normal size, non-tender, without nodularity LYMPH:  no palpable lymphadenopathy in the cervical, axillary or inguinal LUNGS: clear to auscultation and percussion with normal breathing effort HEART: regular rate & rhythm and no murmurs and no lower extremity edema ABDOMEN:abdomen soft, non-tender and normal bowel sounds Musculoskeletal:no cyanosis of digits and no clubbing  PSYCH: alert & oriented x 3 with fluent speech NEURO: no focal motor/sensory deficits  LABORATORY DATA:  I have reviewed the data as listed CBC Latest Ref Rng 11/17/2014 11/15/2014 11/14/2014  WBC 3.9 - 10.3 10e3/uL 2.7(L) 3.2(L) 3.0(L)  Hemoglobin 11.6 - 15.9 g/dL 12.3 12.1 12.1  Hematocrit 34.8 - 46.6 % 37.9 37.9 38.4  Platelets 145 - 400 10e3/uL 204 156 123(L)    CMP Latest Ref Rng 11/17/2014 11/11/2014 11/09/2014  Glucose 70 - 140 mg/dl 106 - 152(H)  BUN 7.0 - 26.0 mg/dL 13.8 - 11  Creatinine 0.6 - 1.1 mg/dL 0.9 - 0.59  Sodium 136 - 145 mEq/L 142 - 138  Potassium 3.5 - 5.1 mEq/L 4.0 - 4.0  Chloride 101 - 111 mmol/L - - 102  CO2 22 - 29 mEq/L 26 - 28  Calcium 8.4 - 10.4 mg/dL 9.4 - 8.5(L)  Total Protein 6.4 - 8.3 g/dL 6.0(L) 5.8(L) -  Total Bilirubin 0.20 - 1.20 mg/dL 0.56 0.5 -  Alkaline Phos 40 - 150 U/L 246(H) 203(H) -  AST 5 - 34 U/L 77(H)  76(H) -  ALT 0 - 55 U/L 103(H) 121(H) -    PATHOLOGY REPORT: Diagnosis 08/17/2014 Liver, needle/core biopsy, left - ADENOCARCINOMA. Microscopic Comment The morphologic features are compatible with metastatic pancreatic adenocarcinoma.  RADIOGRAPHIC STUDIES: I have personally reviewed the radiological images as listed and agreed with the findings in the report.  Ct Abdomen Pelvis W Wo Contrast 08/10/2014    IMPRESSION: 1. 5.4 x 3.2 cm pancreatic body mass consistent with pancreatic adenocarcinoma. There is adjacent lymphadenopathy and diffuse hepatic metastatic disease. The portal vein is patent. 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 2. Very geographic fatty infiltration of the liver. 3. Left-sided biliary dilatation. There is likely meta tumor compressing the left hepatic duct. 4. Uterine fibroids.   Electronically Signed   By: Marijo Sanes M.D.   On: 08/10/2014 14:14      CT chet 08/31/2014 IMPRESSION: No findings specific for metastatic disease in the chest.  3 mm right lower lobe pulmonary nodule favors a benign subpleural lymph node. Attention on follow-up is suggested.  3.1 x 4.6 cm mass in the pancreatic body, corresponding to known pancreatic cancer.  Multifocal hepatic metastases, incompletely visualized.   ASSESSMENT & PLAN:  64 year old Caucasian female, with past medical history of diabetes, hypertension, IBS, arthritis, who was found to have worsening thrombocytopenia, and a newly diagnosed metastatic rectal cancer.  1. Pancreatic cancer with liver metastasis -I reviewed her CT chest, abdomen and pelvis scan findings in the liver mass biopsy results in great detail. The image was reviewed in person. -I discussed the incurable nature of metastatic pancreas cancer and overall poor prognosis. However I am little concerned about how much she really grasp about this. She does have very positive  Attitude  -I recommend systemic  chemotherapy. The goal of treatment is palliation and to prolong her life. -I discussed the option of single agent gemcitabine, gemcitabine with Abraxane, FOLFOX or FOLFIRINOX -Due to her significant thrombocytopenia, I think gemcitabine-based regimen would be difficult to tolerate. I recommend a FOLFOX as first line therapy. Side effects of chemotherapy therapy were discussed with patient and she agrees to proceed. -Giving her moderate thrombocytopenia, I'll dose reduce 15% and omit the 5 FU bolus. -She is currently on second line gemcitabine and Abraxane, due to disease progression -Giving the significant thrombocytopenia after cycle 1 day 1 gemcitabine and Abraxane, I'll hold on Abraxane for now, restart weekly gemcitabine today, if her counts are holding up, we'll add back Abraxane with lower dose. -Lab reviewed, and see 1.1, some cytopenia resolved, liver enzyme improved, adequate for treatment, we'll proceed with gemcitabine today.  2. Thrombocytopenia -This appears to be chronic, she has no history of liver disease, CT scan did not reveal liver cirrhosis or spleen medically. 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 If her platelet count drops significantly after chemotherapy, I'll try a course of prednisone.  -As a bone marrow disease is also possible. Giving the overall poor prognosis from a metastatic pancreas cancer, I did not recommend a bone marrow biopsy at this point. -Her thrombocytopenia come. Resolved after first dose of gemcitabine and Abraxane. -We'll continue monitoring  3. RLE DVT -on coumadin now  -follow up with our coumadin clinic   4. Diabetes, hypertension, IBS, anxiety -She will continue follow-up with her primary care physician -Close monitoring during chemotherapy   5. Social support -She lives alone, her ex-husband is supportive. She knows to call for help if needed  Plan -Restart chemotherapy today, gemcitabine alone -RTC in one week for next dose gemcitabine, then one week  off chemo -RTC in 2 weeks for follow up.  -follow up with coumadin clinic   All questions were answered. The patient knows to call the clinic with any problems, questions or concerns. I spent 20 minutes counseling the patient face to face. The total  time spent in the appointment was 30 minutes and more than 50% was on counseling.      Truitt Merle, MD 11/17/2014 10:07 AM

## 2014-11-17 NOTE — Progress Notes (Signed)
While in infusion chair, patient gave herself 14 units novolog insulin after home glucometer read 120. She states "this is what my doctor told me to do, take 14-18 units." she has no sliding scale instructions at this time. I told pt 120 is within normal limits, but she injects insulin at this time. Patient currently eating lunch of soup and chips with diet soda. Selena Lesser, NP aware. To monitor patient.

## 2014-11-17 NOTE — Patient Instructions (Signed)
[facility] Discharge Instructions for Patients Receiving Chemotherapy  Today you received the following chemotherapy agents Gemzar  To help prevent nausea and vomiting after your treatment, we encourage you to take your nausea medication as prescribed   If you develop nausea and vomiting that is not controlled by your nausea medication, call the clinic.   BELOW ARE SYMPTOMS THAT SHOULD BE REPORTED IMMEDIATELY:  *FEVER GREATER THAN 100.5 F  *CHILLS WITH OR WITHOUT FEVER  NAUSEA AND VOMITING THAT IS NOT CONTROLLED WITH YOUR NAUSEA MEDICATION  *UNUSUAL SHORTNESS OF BREATH  *UNUSUAL BRUISING OR BLEEDING  TENDERNESS IN MOUTH AND THROAT WITH OR WITHOUT PRESENCE OF ULCERS  *URINARY PROBLEMS  *BOWEL PROBLEMS  UNUSUAL RASH Items with * indicate a potential emergency and should be followed up as soon as possible.  Feel free to call the clinic you have any questions or concerns. The clinic phone number is [phone].  Please show the CHEMO ALERT CARD at check-in to the Emergency Department and triage nurse.   

## 2014-11-17 NOTE — Procedures (Signed)
Patient came in with complaint of "stitch" protruding from port incision area.  Rowe Robert, PA evaluated the site.  There was a small suture  protruding from the right corner of the incision site.  It was trimmed to skin level by Rowe Robert PA.   No other problems identified.  Patient was advised to call with any additional concerns.

## 2014-11-17 NOTE — Telephone Encounter (Signed)
Per staff message and POF I have scheduled appts. Advised scheduler of appts and to move labs. JMW  

## 2014-11-18 ENCOUNTER — Telehealth: Payer: Self-pay | Admitting: Hematology

## 2014-11-18 NOTE — Telephone Encounter (Signed)
per Brandy to sch CC-pt aware °

## 2014-11-19 ENCOUNTER — Other Ambulatory Visit (HOSPITAL_BASED_OUTPATIENT_CLINIC_OR_DEPARTMENT_OTHER): Payer: PPO

## 2014-11-19 ENCOUNTER — Ambulatory Visit (HOSPITAL_BASED_OUTPATIENT_CLINIC_OR_DEPARTMENT_OTHER): Payer: PPO

## 2014-11-19 ENCOUNTER — Telehealth: Payer: Self-pay | Admitting: *Deleted

## 2014-11-19 DIAGNOSIS — I82401 Acute embolism and thrombosis of unspecified deep veins of right lower extremity: Secondary | ICD-10-CM

## 2014-11-19 LAB — POCT INR: INR: 2.1

## 2014-11-19 LAB — PROTIME-INR
INR: 2.1 (ref 2.00–3.50)
Protime: 25.2 Seconds — ABNORMAL HIGH (ref 10.6–13.4)

## 2014-11-19 NOTE — Patient Instructions (Signed)
Continue taking Coumadin (warfarin) 7.5 mg every day. Return to Coumadin Clinic on 11/25/14: Lab at 12:15 pm Chemotherapy 1:15 pm Coumadin Clinic at 1:30 pm -- we will see you in infusion Nutrition at 2:30 pm -- to see you in infusion

## 2014-11-19 NOTE — Telephone Encounter (Signed)
Oncology Nurse Navigator Documentation  Oncology Nurse Navigator Flowsheets 11/19/2014  Navigator Encounter Type Telephone conversation with her cousin, Bethena Roys  Patient Visit Type -  Treatment Phase Treatment  Barriers/Navigation Needs Family concerns--clarify schedule. Asking for how to obtain a Life Alert device for her at home? Will have CSW call with this information.  Time Spent with Patient 15  She plans to attend her appointment with her on 11/25/14. Asking about why she developed DVT-explained this is common with Pancreatic cancer and her decrease in mobility.

## 2014-11-19 NOTE — Telephone Encounter (Signed)
PT.'S COUSIN, JUDY, LEFT A MESSAGE FOR SUSAN COWARD,RN (GI NAVIGATOR). SHE HAS QUESTIONS ABOUT PT.'S SCHEDULE AND WOULD LIKE INFORMATION TO OBTAIN A LIFE [place].

## 2014-11-19 NOTE — Progress Notes (Signed)
Ms. Crowl INR is 2.1 today which is within her goal range of 2-2.5. She has been taking 7.5 mg daily (which she has been taking since 6/24 in the hospital). No extra and/or missed doses reported. No medication and/or dietary changes. No bleeding and/or unusual bruising reported. Her leg swelling continues to improve, and she is starting to feel better now that she's out of the hospital.  We talked about physical activity as she said her doctor told her to walk to increase activity, and I suggested the same. We talked about how mobility is good to prevent clots, and to start slow and go easy.  She prefers if we can coordinate her Coumadin appointments with her other scheduled appointments, so we will see her next week when she has chemotherapy scheduled.   Plan: Continue taking Coumadin (warfarin) 7.5 mg every day. Return to Coumadin Clinic on 11/25/14: lab at 12:15 pm, chemotherapy 1:15 pm, Coumadin Clinic at 1:30 pm -- we will see her in infusion

## 2014-11-25 ENCOUNTER — Ambulatory Visit: Payer: PPO | Admitting: Nutrition

## 2014-11-25 ENCOUNTER — Telehealth: Payer: Self-pay | Admitting: Hematology

## 2014-11-25 ENCOUNTER — Other Ambulatory Visit (HOSPITAL_BASED_OUTPATIENT_CLINIC_OR_DEPARTMENT_OTHER): Payer: PPO

## 2014-11-25 ENCOUNTER — Ambulatory Visit (HOSPITAL_BASED_OUTPATIENT_CLINIC_OR_DEPARTMENT_OTHER): Payer: PPO

## 2014-11-25 ENCOUNTER — Encounter: Payer: Self-pay | Admitting: *Deleted

## 2014-11-25 VITALS — BP 134/77 | HR 99 | Temp 98.1°F | Resp 18

## 2014-11-25 DIAGNOSIS — Z5111 Encounter for antineoplastic chemotherapy: Secondary | ICD-10-CM

## 2014-11-25 DIAGNOSIS — C787 Secondary malignant neoplasm of liver and intrahepatic bile duct: Secondary | ICD-10-CM

## 2014-11-25 DIAGNOSIS — C259 Malignant neoplasm of pancreas, unspecified: Secondary | ICD-10-CM

## 2014-11-25 DIAGNOSIS — I82401 Acute embolism and thrombosis of unspecified deep veins of right lower extremity: Secondary | ICD-10-CM

## 2014-11-25 DIAGNOSIS — C251 Malignant neoplasm of body of pancreas: Secondary | ICD-10-CM

## 2014-11-25 LAB — POCT INR: INR: 2

## 2014-11-25 LAB — CBC WITH DIFFERENTIAL/PLATELET
BASO%: 0.8 % (ref 0.0–2.0)
BASOS ABS: 0 10*3/uL (ref 0.0–0.1)
EOS%: 3.2 % (ref 0.0–7.0)
Eosinophils Absolute: 0.2 10*3/uL (ref 0.0–0.5)
HEMATOCRIT: 37.1 % (ref 34.8–46.6)
HEMOGLOBIN: 12.1 g/dL (ref 11.6–15.9)
LYMPH%: 36.7 % (ref 14.0–49.7)
MCH: 28.9 pg (ref 25.1–34.0)
MCHC: 32.6 g/dL (ref 31.5–36.0)
MCV: 88.8 fL (ref 79.5–101.0)
MONO#: 0.9 10*3/uL (ref 0.1–0.9)
MONO%: 17.5 % — ABNORMAL HIGH (ref 0.0–14.0)
NEUT#: 2.1 10*3/uL (ref 1.5–6.5)
NEUT%: 41.8 % (ref 38.4–76.8)
Platelets: 128 10*3/uL — ABNORMAL LOW (ref 145–400)
RBC: 4.18 10*6/uL (ref 3.70–5.45)
RDW: 16.1 % — ABNORMAL HIGH (ref 11.2–14.5)
WBC: 5 10*3/uL (ref 3.9–10.3)
lymph#: 1.8 10*3/uL (ref 0.9–3.3)

## 2014-11-25 LAB — COMPREHENSIVE METABOLIC PANEL (CC13)
ALBUMIN: 3.2 g/dL — AB (ref 3.5–5.0)
ALT: 145 U/L — ABNORMAL HIGH (ref 0–55)
AST: 79 U/L — ABNORMAL HIGH (ref 5–34)
Alkaline Phosphatase: 327 U/L — ABNORMAL HIGH (ref 40–150)
Anion Gap: 9 mEq/L (ref 3–11)
BUN: 14.2 mg/dL (ref 7.0–26.0)
CO2: 25 meq/L (ref 22–29)
Calcium: 9.1 mg/dL (ref 8.4–10.4)
Chloride: 102 mEq/L (ref 98–109)
Creatinine: 0.8 mg/dL (ref 0.6–1.1)
EGFR: 76 mL/min/{1.73_m2} — AB (ref >90)
Glucose: 377 mg/dl — ABNORMAL HIGH (ref 70–140)
POTASSIUM: 4.2 meq/L (ref 3.5–5.1)
Sodium: 136 mEq/L (ref 136–145)
TOTAL PROTEIN: 6.2 g/dL — AB (ref 6.4–8.3)
Total Bilirubin: 0.52 mg/dL (ref 0.20–1.20)

## 2014-11-25 LAB — PROTIME-INR
INR: 2 (ref 2.00–3.50)
Protime: 24 Seconds — ABNORMAL HIGH (ref 10.6–13.4)

## 2014-11-25 MED ORDER — SODIUM CHLORIDE 0.9 % IV SOLN
Freq: Once | INTRAVENOUS | Status: AC
  Administered 2014-11-25: 14:00:00 via INTRAVENOUS
  Filled 2014-11-25: qty 4

## 2014-11-25 MED ORDER — SODIUM CHLORIDE 0.9 % IJ SOLN
10.0000 mL | INTRAMUSCULAR | Status: DC | PRN
  Administered 2014-11-25: 10 mL
  Filled 2014-11-25: qty 10

## 2014-11-25 MED ORDER — SODIUM CHLORIDE 0.9 % IV SOLN
Freq: Once | INTRAVENOUS | Status: AC
  Administered 2014-11-25: 14:00:00 via INTRAVENOUS

## 2014-11-25 MED ORDER — GEMCITABINE HCL CHEMO INJECTION 1 GM/26.3ML
800.0000 mg/m2 | Freq: Once | INTRAVENOUS | Status: AC
  Administered 2014-11-25: 1672 mg via INTRAVENOUS
  Filled 2014-11-25: qty 43.97

## 2014-11-25 MED ORDER — HEPARIN SOD (PORK) LOCK FLUSH 100 UNIT/ML IV SOLN
500.0000 [IU] | Freq: Once | INTRAVENOUS | Status: AC | PRN
  Administered 2014-11-25: 500 [IU]
  Filled 2014-11-25: qty 5

## 2014-11-25 NOTE — Discharge Summary (Signed)
Physician Discharge Summary  Amber Bishop UKG:[id number] DOB: October 30, 1950 DOA: 11/06/2014  PCP: [name], MD  Admit date: 11/06/2014 Discharge date: 11/15/2014  Time spent: 30 minutes  Recommendations for Outpatient Follow-up:  1. Follow up with oncology as recommended.   Discharge Diagnoses:  Principal Problem:   DVT (deep venous thrombosis) Active Problems:   Anxiety state   Essential hypertension   Irritable bowel syndrome   Diabetes mellitus type 2, uncontrolled, without complications   Dilated aortic root   Bicuspid aortic valve   LVE (left ventricular enlargement)   Pancreatic cancer metastasized to liver   Right leg DVT   DVT (deep vein thrombosis) in pregnancy   Cellulitis of right leg   Antineoplastic chemotherapy induced pancytopenia   Discharge Condition: improved.  Diet recommendation: low sodium diet.   Filed Weights   11/06/14 2138  Weight: 98.839 kg (217 lb 14.4 oz)    History of present illness:  64 y.o. female with PMH of hypertension, diabetes mellitus, depression, anxiety, IBS, arthritis, dilated aortic root, tricuspid aortic valve, pancreatic cancer metastasized to liver (on chemotherapy currently), who presents with RLE cellulitis. In ED, patient was found to have WBC 3.8, platelet 46, LE venous Doppler revealed right lower extremity DVT and left lower extremity SVT. Cellulitis improved. Currently on IV heparin bridge and Coumadin for DVT. transitioned to lovenox injections.   Hospital Course:   Acute right lower extremity DVT Possibly triggered by underlying malignancy. On empiric IV heparin drip due to thrombocytopenia and bridging with Coumadin. Oncology Dr. Annamaria Boots consulted and recommends Coumadin with target INR of 2-2.5. INR of 1. 3 Today. Warfarin dose increased to 7.5 mg today. She was transitioned to  lovenox injections on discharge. .   Right leg cellulitis Initially placed on empiric IV vancomycin and Zosyn. Cultures negative.  Cellulitis now resolved. Has purpuric rash and thrombocytopenia. Now patient oral doxycycline to complete a total 7 days of treatment. (complete antibiotics after 6/25)  Uncontrolled type 2 diabetes mellitus A1c is 9.7. resume home medications. . CBG (last 3)   Recent Labs (last 2 labs)      Recent Labs  11/14/14 0734 11/14/14 1156 11/14/14 1648  GLUCAP 116* 122* 235*        Anxiety and depression Stable. Continue home medications  Essential hypertension Well controlled.  Continue amlodipine and irbesartan.  Pancytopenia Like secondary to chemotherapy. Monitor closely. Improving   Bilateral ankle itching Continue when necessary IV Benadryl. Order for topical Benadryl cream . Ordered a dose of cholestyramine as needed.           Procedures:  none  Consultations:  oncology  Discharge Exam: Filed Vitals:   11/15/14 1350  BP: 122/75  Pulse: 88  Temp: 97.9 F (36.6 C)  Resp: 17    General: alert afebrile comfortable Cardiovascular: s1s2 Respiratory: ctab.  Discharge Instructions   Discharge Instructions    AMB Referral to [facility]    Complete by:  As directed   Request for PharmD assistance. Patient indicates her insulins are 208.00 and 170.00. States her insulin are very hard to afford. [facility] on [address] is her pharmacy. Has [employer]. Consents were signed. [employer] to follow for transition of care calls initially. Patient will need [facility] RNCM follow up for DM management and education after [employer] engagement. May need social work referral after [employer] engagement as well. Please call with questions. Marthenia Rolling, [place], [facility] [phone]  Reason for consult:  Please assign to [facility] PharmD for  possible medication assistance.  Diagnoses of:  Diabetes  Expected date of contact:  1-3 days (reserved for hospital discharges)     Diet - low sodium heart healthy    Complete by:  As  directed      Discharge instructions    Complete by:  As directed   Follow up with oncology Dr Burr Medico on Wednesday as recommended.  Check INR on Wednesday .  Please pick up the three lovenox syringes from the cancer center front desk.          Discharge Medication List as of 11/15/2014  3:46 PM    START taking these medications   Details  diphenhydrAMINE-zinc acetate (BENADRYL) cream Apply topically 2 (two) times daily as needed for itching., Starting 11/15/2014, Until Discontinued, Print    warfarin (COUMADIN) 7.5 MG tablet Take 1 tablet (7.5 mg total) by mouth daily., Starting 11/15/2014, Until Discontinued, Print    enoxaparin (LOVENOX) 100 MG/ML injection Inject 1 mL (100 mg total) into the skin every 12 (twelve) hours., Starting 11/15/2014, Until Discontinued, Normal      CONTINUE these medications which have NOT CHANGED   Details  ALPRAZolam (XANAX) 0.5 MG tablet TAKE 1 TABLET THREE TIMES DAILY IF NEEDED FOR ANXIETY, Print    amLODipine (NORVASC) 5 MG tablet Take 5 mg by mouth every morning. , Starting 03/16/2014, Until Discontinued, Historical Med    blood glucose meter kit and supplies KIT Dispense based on patient and insurance preference. Use up to four times daily as directed. Dx code: E11.65, Normal    Calcium Carbonate-Vitamin D (CALCIUM 600/VITAMIN D) 600-400 MG-UNIT per tablet Take 1 tablet by mouth 2 (two) times daily. , Until Discontinued, Historical Med    cetirizine (ZYRTEC) 10 MG tablet Take 10 mg by mouth daily., Until Discontinued, Historical Med    cholestyramine (QUESTRAN) 4 G packet Take 1/2-1 packet dissolved in water/juice once daily....................Marland KitchenPRN, Normal    dextromethorphan (DELSYM) 30 MG/5ML liquid Take 15 mg by mouth 2 (two) times daily as needed for cough., Until Discontinued, Historical Med    FLUoxetine (PROZAC) 20 MG capsule Take 20 mg by mouth every morning. , Until Discontinued, Historical Med    glucose blood (ACCU-CHEK AVIVA PLUS) test  strip Test 4 times daily as instructed., Normal    insulin aspart (NOVOLOG FLEXPEN) 100 UNIT/ML FlexPen Inject 14-18 units into the skin 3 times daily as instructed., Normal    insulin glargine (LANTUS) 100 UNIT/ML injection Inject 74 Units into the skin at bedtime. , Until Discontinued, Historical Med    Insulin Pen Needle (VALUMARK PEN NEEDLES) 31G X 8 MM MISC Use to inject insulin 4 times daily as instructed., Normal    LANCETS ULTRA THIN 30G MISC Use to test blood sugar 4 times daily as instructed. Dx code: E11.65, Normal    lidocaine-prilocaine (EMLA) cream Apply 1 application topically as needed., Starting 08/30/2014, Until Discontinued, Normal    Multiple Vitamins-Minerals (CENTRUM SILVER PO) Take 1 capsule by mouth every morning. , Until Discontinued, Historical Med    Omega-3 Fatty Acids (FISH OIL) 1000 MG CAPS Take 1 capsule by mouth every morning. , Until Discontinued, Historical Med    OVER THE COUNTER MEDICATION Apply 1 drop topically 3 (three) times daily. Essential Oils., Until Discontinued, Historical Med    valsartan-hydrochlorothiazide (DIOVAN-HCT) 320-12.5 MG per tablet Take 1 tablet by mouth every morning. , Starting 03/16/2014, Until Discontinued, Historical Med      STOP taking these medications     meloxicam (MOBIC) 15  MG tablet      ondansetron (ZOFRAN) 8 MG tablet      PREVNAR 13 SUSP injection      dexamethasone (DECADRON) 4 MG tablet      predniSONE (DELTASONE) 20 MG tablet      predniSONE (DELTASONE) 5 MG tablet      prochlorperazine (COMPAZINE) 10 MG tablet      prochlorperazine (COMPAZINE) 10 MG tablet      zolpidem (AMBIEN) 5 MG tablet        Allergies  Allergen Reactions  . Naprosyn [Naproxen] Nausea And Vomiting    Very nauseated but no vomiting.  . Metformin And Related Diarrhea    Pt has IBS    Follow-up Information    Follow up with Lynne Logan, MD. Go on 11/23/2014.   Specialty:  Family Medicine   Why:  at 12:15pm   For Post  Hospitalization Follow Up.  Dr Harlan Stains is not available   Contact information:   [address] [place] [address] [phone]        The results of significant diagnostics from this hospitalization (including imaging, microbiology, ancillary and laboratory) are listed below for reference.    Significant Diagnostic Studies: Ir Patient Eval Tech 0-60 Mins  11/17/2014   [facility]     11/17/2014  1:34 PM Patient came in with complaint of "stitch" protruding from port  incision area.  Rowe Robert, PA evaluated the site.  There was a  small suture  protruding from the right corner of the incision  site.  It was trimmed to skin level by Rowe Robert PA.   No  other problems identified.  Patient was advised to call with any  additional concerns.   Microbiology: No results found for this or any previous visit (from the past 240 hour(s)).   Labs: Basic Metabolic Panel:  Recent Labs Lab 11/25/14 1241  NA 136  K 4.2  CO2 25  GLUCOSE 377*  BUN 14.2  CREATININE 0.8  CALCIUM 9.1   Liver Function Tests:  Recent Labs Lab 11/25/14 1241  AST 79*  ALT 145*  ALKPHOS 327*  BILITOT 0.52  PROT 6.2*  ALBUMIN 3.2*   No results for input(s): LIPASE, AMYLASE in the last 168 hours. No results for input(s): AMMONIA in the last 168 hours. CBC:  Recent Labs Lab 11/25/14 1241  WBC 5.0  NEUTROABS 2.1  HGB 12.1  HCT 37.1  MCV 88.8  PLT 128*   Cardiac Enzymes: No results for input(s): CKTOTAL, CKMB, CKMBINDEX, TROPONINI in the last 168 hours. BNP: BNP (last 3 results) No results for input(s): BNP in the last 8760 hours.  ProBNP (last 3 results) No results for input(s): PROBNP in the last 8760 hours.  CBG: No results for input(s): GLUCAP in the last 168 hours.     SignedHosie Poisson  [facility] 11/25/2014, 9:41 PM

## 2014-11-25 NOTE — Telephone Encounter (Signed)
per katelyn to sch CC-pt aware °

## 2014-11-25 NOTE — Progress Notes (Signed)
Per Dr. Lindi Adie, ok to treat with ALT 145.  Pt to hold fish oil.

## 2014-11-25 NOTE — Progress Notes (Signed)
64 year-old female diagnosed with Pancreas cancer with liver mets.  She is a patient of Dr. Burr Medico.  PMH includes DM, anxiety, HTN, IBS, Anemia, Diverticulosis  Medications include Xanax, Calcium with Vitamin D, Questran, decadron, prozac, novolog, lantus, prednisone, compazine.  Labs were reviewed.  Height:  64 inches. Weight: 216.1 pounds. UBW: 230 pounds. BMI: 37.08.  Patient reports fatigue. She states she has constipation. She denies N, V and D. She eats 3 meals daily and checks blood sugars regularly.  Nutrition Diagnosis:  Unintended weight loss related to inadequate oral intake as evidenced by 14 pound weight loss from usual body weight.  Intervention:   Educated patient to consume 3 meals daily with snacks as needed. Reviewed CHO controlled diet. Educated patient on strategies to improve constipation. Provided fact sheet and contact information. Questions answered and teach back method used..  Monitoring, Evaluation, Goals:  Patient will tolerate adequate calories and protein to maintain lean body mass.  Next Visit:  Patient to contact me with questions.

## 2014-11-25 NOTE — Progress Notes (Signed)
*  Seen in infusion area * No charge for encounter*   [name]'s INR is 2.0 which is within her goal range of 2-2.5. She has been taking 7.5 mg daily. She reports no missed and/or extra doses. No changes in medications or diet reported. No bleeding and/or unusual bruising. She has been walking more lately which she thinks is helping the swelling in her legs. Her PCP has recommended she try compression stockings, which she says she will try this coming week. [name] reports no issues with her anticoagulation therapy.  Plan: Continue Coumadin 7.5 mg daily. Return for INR check on 7/20: lab at 10:15 am and infusion at 11:00 am - we will see her in infusion.

## 2014-11-25 NOTE — [facility] Oncology Navigator Note (Signed)
Oncology Nurse Navigator Documentation  Oncology Nurse Navigator Flowsheets 11/25/2014  Navigator Encounter Type Treatment Follow up  Patient Visit Type Medonc  Treatment Phase Treatment-Gemzar  Barriers/Navigation Needs Education-Blood sugar control; Life Alert systems  Education Reviewed close monitor of sugars and document. Call PCP if consistently high. Provided her several companies that provide home monitor for emergency medical issues. She will forward these to her cousin, Bethena Roys who requested the info.  Education Method Written  Time Spent with Patient 15  Feeling well and reporting significant hair loss-hair has thinned, but no need for wig at this time. Made her aware that [facility] and [facility] has free wigs if interested. Needs to make an appointment. She will consider this if more hair comes out.

## 2014-11-25 NOTE — Patient Instructions (Signed)
[facility] Discharge Instructions for Patients Receiving Chemotherapy  Today you received the following chemotherapy agents: gemzar  To help prevent nausea and vomiting after your treatment, we encourage you to take your nausea medication.  Take it as often as prescribed.     If you develop nausea and vomiting that is not controlled by your nausea medication, call the clinic. If it is after clinic hours your family physician or the after hours number for the clinic or go to the Emergency Department.   BELOW ARE SYMPTOMS THAT SHOULD BE REPORTED IMMEDIATELY:  *FEVER GREATER THAN 100.5 F  *CHILLS WITH OR WITHOUT FEVER  NAUSEA AND VOMITING THAT IS NOT CONTROLLED WITH YOUR NAUSEA MEDICATION  *UNUSUAL SHORTNESS OF BREATH  *UNUSUAL BRUISING OR BLEEDING  TENDERNESS IN MOUTH AND THROAT WITH OR WITHOUT PRESENCE OF ULCERS  *URINARY PROBLEMS  *BOWEL PROBLEMS  UNUSUAL RASH Items with * indicate a potential emergency and should be followed up as soon as possible.  Feel free to call the clinic you have any questions or concerns. The clinic phone number is [phone].   I have been informed and understand all the instructions given to me. I know to contact the clinic, my physician, or go to the Emergency Department if any problems should occur. I do not have any questions at this time, but understand that I may call the clinic during office hours   should I have any questions or need assistance in obtaining follow up care.    __________________________________________  _____________  __________ Signature of Patient or Authorized Representative            Date                   Time    __________________________________________ Nurse's Signature    

## 2014-11-30 ENCOUNTER — Telehealth: Payer: Self-pay | Admitting: *Deleted

## 2014-11-30 NOTE — Telephone Encounter (Signed)
Oncology Nurse Navigator Documentation  Oncology Nurse Navigator Flowsheets 11/30/2014  Navigator Encounter Type Telephone  Patient Visit Type -  Treatment Phase -  Barriers/Navigation Needs Education-Advice re: sexual intimacy  Education OK to have sex, but Amber Bishop needs to use condom for 72 hours after tx. Made her aware she may need to use feminine lubricant, since her tissues may be dry for her comfort. She appreciated the advice.  Education Method Verbal  Time Spent with Patient (Retired) -

## 2014-12-01 ENCOUNTER — Telehealth: Payer: Self-pay | Admitting: *Deleted

## 2014-12-01 ENCOUNTER — Other Ambulatory Visit (HOSPITAL_COMMUNITY)
Admission: AD | Admit: 2014-12-01 | Discharge: 2014-12-01 | Disposition: A | Discharge status: Home / Self Care | Payer: PPO | Source: Ambulatory Visit | Attending: Hematology | Admitting: Hematology

## 2014-12-01 ENCOUNTER — Other Ambulatory Visit (HOSPITAL_BASED_OUTPATIENT_CLINIC_OR_DEPARTMENT_OTHER): Payer: PPO

## 2014-12-01 ENCOUNTER — Encounter: Payer: Self-pay | Admitting: Hematology

## 2014-12-01 ENCOUNTER — Telehealth: Payer: Self-pay | Admitting: Hematology

## 2014-12-01 ENCOUNTER — Ambulatory Visit (HOSPITAL_BASED_OUTPATIENT_CLINIC_OR_DEPARTMENT_OTHER): Payer: PPO | Admitting: Hematology

## 2014-12-01 VITALS — BP 149/94 | HR 79 | Temp 98.4°F | Resp 18 | Ht 64.5 in | Wt 220.5 lb

## 2014-12-01 DIAGNOSIS — C259 Malignant neoplasm of pancreas, unspecified: Secondary | ICD-10-CM

## 2014-12-01 DIAGNOSIS — C801 Malignant (primary) neoplasm, unspecified: Secondary | ICD-10-CM | POA: Insufficient documentation

## 2014-12-01 DIAGNOSIS — C787 Secondary malignant neoplasm of liver and intrahepatic bile duct: Secondary | ICD-10-CM

## 2014-12-01 DIAGNOSIS — I82401 Acute embolism and thrombosis of unspecified deep veins of right lower extremity: Secondary | ICD-10-CM

## 2014-12-01 LAB — COMPREHENSIVE METABOLIC PANEL (CC13)
ALT: 156 U/L — AB (ref 0–55)
AST: 104 U/L — ABNORMAL HIGH (ref 5–34)
Albumin: 3.2 g/dL — ABNORMAL LOW (ref 3.5–5.0)
Alkaline Phosphatase: 260 U/L — ABNORMAL HIGH (ref 40–150)
Anion Gap: 8 mEq/L (ref 3–11)
BILIRUBIN TOTAL: 0.55 mg/dL (ref 0.20–1.20)
BUN: 11.3 mg/dL (ref 7.0–26.0)
CHLORIDE: 100 meq/L (ref 98–109)
CO2: 30 meq/L — AB (ref 22–29)
Calcium: 9.5 mg/dL (ref 8.4–10.4)
Creatinine: 0.8 mg/dL (ref 0.6–1.1)
EGFR: 79 mL/min/{1.73_m2} — ABNORMAL LOW (ref >90)
Glucose: 187 mg/dl — ABNORMAL HIGH (ref 70–140)
POTASSIUM: 3.9 meq/L (ref 3.5–5.1)
Sodium: 137 mEq/L (ref 136–145)
Total Protein: 6 g/dL — ABNORMAL LOW (ref 6.4–8.3)

## 2014-12-01 LAB — CBC WITH DIFFERENTIAL/PLATELET
BASO%: 1.1 % (ref 0.0–2.0)
BASOS ABS: 0.1 10*3/uL (ref 0.0–0.1)
EOS%: 2.2 % (ref 0.0–7.0)
Eosinophils Absolute: 0.1 10*3/uL (ref 0.0–0.5)
HEMATOCRIT: 37.4 % (ref 34.8–46.6)
HEMOGLOBIN: 12 g/dL (ref 11.6–15.9)
LYMPH%: 41.3 % (ref 14.0–49.7)
MCH: 28.5 pg (ref 25.1–34.0)
MCHC: 32.1 g/dL (ref 31.5–36.0)
MCV: 88.6 fL (ref 79.5–101.0)
MONO#: 0.3 10*3/uL (ref 0.1–0.9)
MONO%: 6.8 % (ref 0.0–14.0)
NEUT#: 2.2 10*3/uL (ref 1.5–6.5)
NEUT%: 48.6 % (ref 38.4–76.8)
Platelets: 99 10*3/uL — ABNORMAL LOW (ref 145–400)
RBC: 4.23 10*6/uL (ref 3.70–5.45)
RDW: 16.7 % — ABNORMAL HIGH (ref 11.2–14.5)
WBC: 4.5 10*3/uL (ref 3.9–10.3)
lymph#: 1.9 10*3/uL (ref 0.9–3.3)

## 2014-12-01 LAB — PROTIME-INR
INR: 1.56 — AB (ref 0.00–1.49)
Prothrombin Time: 18.7 seconds — ABNORMAL HIGH (ref 11.6–15.2)

## 2014-12-01 NOTE — Progress Notes (Signed)
Pt gave me an approval letter from the [employer] assistance foundation.  I called to get her grant amount and was told they closed her account because the pt never provided the enrollment form or her income information.  The rep said if the pt turned the needed info in they would reconsider opening up her account.  I informed the pt of this.

## 2014-12-01 NOTE — Telephone Encounter (Signed)
Pt confirmed labs/ov per 07/13 POF, gave pt AVS and Calendar.... KJ, took pt to Radiology for chest X-Ray

## 2014-12-01 NOTE — Telephone Encounter (Signed)
Per staff message and POF I have scheduled appts. Advised scheduler of appts. JMW  

## 2014-12-01 NOTE — Progress Notes (Signed)
[place]  [phone] Fax:([phone]) [place] Note   Patient Care Team: Harlan Stains, MD as PCP - General (Family Medicine) Truitt Merle, MD as Consulting Physician (Hematology and Oncology) Delrae Rend, MD as Consulting Physician (Endocrinology) Tania Ade, RN as Registered Nurse (Oncology) Gaynelle Adu, [facility] as [place] Management (Pharmacist) 12/01/2014  CHIEF COMPLAINTS:  Follow up thrombocytopenia and metastatic pancreatic cancer  Oncology History   Pancreatic cancer metastasized to liver   Staging form: Pancreas, AJCC 7th Edition     Clinical: Stage IV (T2, NX, M1) - Unsigned       Pancreatic cancer metastasized to liver   08/10/2014 Imaging CT abdomen showed 5.4 x 3.2 cm pancreatic body mass consistent with pancreatic adenocarcinoma. There is adjacent lymphadenopathy and diffuse  hepatic metastatic disease.   08/17/2014 Pathology Results Liver biopsy showed adenocarcinoma, consistent with pancreatic primary.   08/17/2014 Initial Diagnosis Pancreatic cancer metastasized to liver   08/24/2014 Tumor Marker CA19.9 >140000   08/31/2014 Imaging CT chest was negative for   09/02/2014 - 10/14/2014 Chemotherapy mFOLFOX, 4 cycles    10/26/2014 Progression CT chest, abdomen and pelvis showed probable disease progression in the liver and a stable primary pancreatic body mass.   11/02/2014 -  Chemotherapy Second line chemotherapy with gemcitabine and Abraxane   11/06/2014 - 11/15/2014 Hospital Admission She was admitted for right lower extremity DVT and cellulitis. She was discharged home with Coumadin and Lovenox bridging.    HISTORY OF PRESENTING ILLNESS:  Amber Bishop 64 y.o. female was referred by her primary care physician Dr. Dema Severin for thrombocytopenia. She also was recently diagnosed with metastatic pancreatic cancer.   She has had a chronic thrombocytopenia since 2013. Her plt count has been in the range of 100-150,  but dropped to the range of 80s lately. She denies any signs of bleeding. No history of liver disease.  She has been diabetic for 24 years, and her blood glucose has been out of control in the past one year. She also reports fatigue in the past one year. She denies any significant abdominal pain, jaundice, bloating, or change of her bowel habits. Her appetite has been normal. No nausea or vomiting. She is able to do all selfcare, but she sleeps more than usually during the day. No abdominal pain or bloating. She was found to have abnormal LFTs on routine lab work, and her PCP Dr. Dema Severin, who ordered and abdominal US which lead to a CT scan, which showed a 5.4 x 3.4 cm pancreatic body mass, adjacent lymphadenopathy, and diffuse hepatic metastasis. She underwent ultrasound-guided liver biopsy on 08/17/2014, and the biopsy showed adenocarcinoma consistent with pancreatic primary.   INTERIM HISTORY: Kendahl returns for follow-up. She tolerated single agent weekly Gemzar very well for the past 2 weeks. She denies any noticeable symptoms except hair loss. She denies any pain, nausea, abdominal discomfort or change of her bowel habits. No fever or bleeding. She has decent energy level and appetite, function well at home. Her right lower extremity skin erythema are much improved, still has mild edema. She has been follow-up with our Coumadin clinic.  MEDICAL HISTORY:  Past Medical History  Diagnosis Date  . Anxiety   . Hypertension   . IBS (irritable bowel syndrome)   . Hyperplastic colon polyp   . Allergy   . Anemia   . Arthritis   . Diverticulosis   . Dilated aortic root     seen on prior  echo but echo 05/2013 showed normal dimensions  . Bicuspid aortic valve   . LVE (left ventricular enlargement)     mild by echo 1/15 with EF 50-55%  . pancreatic ca w/ liver mets dx'd 07/2014  . Diabetes mellitus without complication     SURGICAL HISTORY: Past Surgical History  Procedure Laterality Date  . Tubal  ligation      age 41  . Colonoscopy    . Polypectomy    . Tooth extraction      3 teeth  . Belpharoptosis repair      SOCIAL HISTORY: History   Social History  . Marital Status: Single    Spouse Name: N/A  . Number of Children: 1 son   . Years of Education: N/A   Occupational History  . 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 Smoking status: Never Smoker   . Smokeless tobacco: Never Used  . Alcohol Use: No  . Drug Use: No  . Sexual Activity: Not on file   Other Topics Concern  . Not on file   Social History Narrative    FAMILY HISTORY: Family History  Problem Relation Age of Onset  . Colon cancer Neg Hx   . Esophageal cancer Neg Hx   . Stomach cancer Neg Hx   . Asthma Mother     ALLERGIES:  is allergic to naprosyn and metformin and related.  MEDICATIONS:  Current Outpatient Prescriptions  Medication Sig Dispense Refill  . ALPRAZolam (XANAX) 0.5 MG tablet TAKE 1 TABLET THREE TIMES DAILY IF NEEDED FOR ANXIETY 90 tablet 3  . amLODipine (NORVASC) 5 MG tablet Take 5 mg by mouth every morning.     . blood glucose meter kit and supplies KIT Dispense based on patient and insurance preference. Use up to four times daily as directed. Dx code: E11.65 1 each 0  . Calcium Carbonate-Vitamin D (CALCIUM 600/VITAMIN D) 600-400 MG-UNIT per tablet Take 1 tablet by mouth 2 (two) times daily.     . cetirizine (ZYRTEC) 10 MG tablet Take 10 mg by mouth daily.    . cholestyramine (QUESTRAN) 4 G packet Take 1/2-1 packet dissolved in water/juice once daily....................Marland KitchenPRN (Patient taking differently: Take 2-4 g by mouth daily as needed (IBS). ) 90 each 3  . dextromethorphan (DELSYM) 30 MG/5ML liquid Take 15 mg by mouth 2 (two) times daily as needed for cough.    . diphenhydrAMINE-zinc acetate (BENADRYL) cream Apply topically 2 (two) times daily as needed for itching. 28.4 g 0  . FLUoxetine (PROZAC) 20 MG capsule Take 20 mg by mouth every morning.     Marland Kitchen glucose  blood (ACCU-CHEK AVIVA PLUS) test strip Test 4 times daily as instructed. 125 each 3  . insulin aspart (NOVOLOG FLEXPEN) 100 UNIT/ML FlexPen Inject 14-18 units into the skin 3 times daily as instructed. (Patient taking differently: Inject 20-28 Units into the skin 3 (three) times daily with meals. ) 15 mL 2  . insulin glargine (LANTUS) 100 UNIT/ML injection Inject 74 Units into the skin at bedtime.     . Insulin Pen Needle (VALUMARK PEN NEEDLES) 31G X 8 MM MISC Use to inject insulin 4 times daily as instructed. 150 each 3  . LANCETS ULTRA THIN 30G MISC Use to test blood sugar 4 times daily as instructed. Dx code: E11.65 200 each 2  . lidocaine-prilocaine (EMLA) cream Apply 1 application topically as needed. 30 g 3  . Multiple Vitamins-Minerals (CENTRUM SILVER PO) Take  1 capsule by mouth every morning.     Marland Kitchen OVER THE COUNTER MEDICATION Apply 1 drop topically 3 (three) times daily. Essential Oils.    . valsartan-hydrochlorothiazide (DIOVAN-HCT) 320-12.5 MG per tablet Take 1 tablet by mouth every morning.     . warfarin (COUMADIN) 7.5 MG tablet Take 1 tablet (7.5 mg total) by mouth daily. 30 tablet 1  . Omega-3 Fatty Acids (FISH OIL) 1000 MG CAPS Take 1 capsule by mouth every morning.      No current facility-administered medications for this visit.    REVIEW OF SYSTEMS:   Constitutional: Denies fevers, chills or abnormal night sweats Eyes: Denies blurriness of vision, double vision or watery eyes Ears, nose, mouth, throat, and face: Denies mucositis or sore throat Respiratory: Denies cough, dyspnea or wheezes Cardiovascular: Denies palpitation, chest discomfort or lower extremity swelling Gastrointestinal:  Denies nausea, heartburn or change in bowel habits Skin: Denies abnormal skin rashes Lymphatics: Denies new lymphadenopathy or easy bruising Neurological:Denies numbness, tingling or new weaknesses Behavioral/Psych: Mood is stable, no new changes  All other systems were reviewed with the  patient and are negative.   PHYSICAL EXAMINATION: ECOG PERFORMANCE STATUS: 1 - Symptomatic but completely ambulatory  Filed Vitals:   12/01/14 1320  BP: 149/94  Pulse: 79  Temp: 98.4 F (36.9 C)  Resp: 18   Filed Weights   12/01/14 1320  Weight: 220 lb 8 oz (100.018 kg)    GENERAL:alert, no distress and comfortable SKIN: skin color, texture, turgor are normal, no rashes or significant lesions except (+) mild skin pigmentation in the right lower extremity EYES: normal, conjunctiva are pink and non-injected, sclera clear OROPHARYNX:no exudate, no erythema and lips, buccal mucosa, and tongue normal  NECK: supple, thyroid normal size, non-tender, without nodularity LYMPH:  no palpable lymphadenopathy in the cervical, axillary or inguinal LUNGS: clear to auscultation and percussion with normal breathing effort HEART: regular rate & rhythm and no murmurs and no lower extremity edema ABDOMEN:abdomen soft, non-tender and normal bowel sounds Musculoskeletal:no cyanosis of digits and no clubbing. (+) Mild pitting edema in the right lower extremity above the ankle. PSYCH: alert & oriented x 3 with fluent speech NEURO: no focal motor/sensory deficits  LABORATORY DATA:  I have reviewed the data as listed CBC Latest Ref Rng 12/01/2014 11/25/2014 11/17/2014  WBC 3.9 - 10.3 10e3/uL 4.5 5.0 2.7(L)  Hemoglobin 11.6 - 15.9 g/dL 12.0 12.1 12.3  Hematocrit 34.8 - 46.6 % 37.4 37.1 37.9  Platelets 145 - 400 10e3/uL 99(L) 128(L) 204    CMP Latest Ref Rng 12/01/2014 11/25/2014 11/17/2014  Glucose 70 - 140 mg/dl 187(H) 377(H) 106  BUN 7.0 - 26.0 mg/dL 11.3 14.2 13.8  Creatinine 0.6 - 1.1 mg/dL 0.8 0.8 0.9  Sodium 136 - 145 mEq/L 137 136 142  Potassium 3.5 - 5.1 mEq/L 3.9 4.2 4.0  Chloride 101 - 111 mmol/L - - -  CO2 22 - 29 mEq/L 30(H) 25 26  Calcium 8.4 - 10.4 mg/dL 9.5 9.1 9.4  Total Protein 6.4 - 8.3 g/dL 6.0(L) 6.2(L) 6.0(L)  Total Bilirubin 0.20 - 1.20 mg/dL 0.55 0.52 0.56  Alkaline Phos 40 -  150 U/L 260(H) 327(H) 246(H)  AST 5 - 34 U/L 104(H) 79(H) 77(H)  ALT 0 - 55 U/L 156(H) 145(H) 103(H)    PATHOLOGY REPORT: Diagnosis 08/17/2014 Liver, needle/core biopsy, left - ADENOCARCINOMA. Microscopic Comment The morphologic features are compatible with metastatic pancreatic adenocarcinoma.  RADIOGRAPHIC STUDIES: I have personally reviewed the radiological images as listed and agreed  with the findings in the report.  Ct Abdomen Pelvis W Wo Contrast 08/10/2014    IMPRESSION: 1. 5.4 x 3.2 cm pancreatic body mass consistent with pancreatic adenocarcinoma. There is adjacent lymphadenopathy and diffuse hepatic metastatic disease. The portal vein is patent. 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 2. Very geographic fatty infiltration of the liver. 3. Left-sided biliary dilatation. There is likely meta tumor compressing the left hepatic duct. 4. Uterine fibroids.   Electronically Signed   By: Marijo Sanes M.D.   On: 08/10/2014 14:14      CT chet 08/31/2014 IMPRESSION: No findings specific for metastatic disease in the chest.  3 mm right lower lobe pulmonary nodule favors a benign subpleural lymph node. Attention on follow-up is suggested.  3.1 x 4.6 cm mass in the pancreatic body, corresponding to known pancreatic cancer.  Multifocal hepatic metastases, incompletely visualized.   ASSESSMENT & PLAN:  64 year old Caucasian female, with past medical history of diabetes, hypertension, IBS, arthritis, who was found to have worsening thrombocytopenia, and a newly diagnosed metastatic rectal cancer.  1. Pancreatic cancer with liver metastasis -I reviewed her CT chest, abdomen and pelvis scan findings in the liver mass biopsy results in great detail. The image was reviewed in person. -I discussed the incurable nature of metastatic pancreas cancer and overall poor prognosis. However I am little concerned about how much she really grasp about this. She  does have very positive  Attitude  -I recommend systemic chemotherapy. The goal of treatment is palliation and to prolong her life. -I discussed the option of single agent gemcitabine, gemcitabine with Abraxane, FOLFOX or FOLFIRINOX -Due to her significant thrombocytopenia, I think gemcitabine-based regimen would be difficult to tolerate. I recommend a FOLFOX as first line therapy. Side effects of chemotherapy therapy were discussed with patient and she agrees to proceed. -She is currently on second line gemcitabine and Abraxane, due to disease progression -Giving the significant thrombocytopenia after cycle 1 day 1 gemcitabine and Abraxane, I restart chemo with weekly gemcitabine alone, she tolerated well, mild to moderate thrombocytopenia -If her platelet count recovers well, we'll start second cycle with dose reduced Abraxane and gemcitabine next week -she is off chemo this week   2. Thrombocytopenia -This appears to be chronic, she has no history of liver disease, CT scan did not reveal liver cirrhosis or spleenmagely. -This is possible ITP, but she did not respond well to a trial of prednisone -As a bone marrow disease is also possible. Giving the overall poor prognosis from a metastatic pancreas cancer, I did not recommend a bone marrow biopsy at this point. -Her platelet count recovered to normal after first dose of gemcitabine and Abraxane. -We'll continue monitoring  3. RLE DVT -on coumadin now  -follow up with our coumadin clinic   4. Diabetes, hypertension, IBS, anxiety -She will continue follow-up with her primary care physician -Close monitoring during chemotherapy  5. Social support -She lives alone, her ex-husband is supportive. She knows to call for help if needed  Plan -Second cycle Abraxane and gemcitabine next week with dose reduction -I'll see her back in 2 weeks  All questions were answered. The patient knows to call the clinic with any problems, questions or  concerns. I spent 20 minutes counseling the patient face to face. The total time spent in the appointment was 30 minutes and more than 50% was on counseling.      Truitt Merle, MD 12/01/2014 1:32 PM

## 2014-12-02 ENCOUNTER — Telehealth: Payer: Self-pay | Admitting: *Deleted

## 2014-12-02 NOTE — Telephone Encounter (Signed)
Patient left VM that she went to hairdresser today. Noted red bumps all over her scalp and down back of neck and they are itching. Forwarded message to collaborative nurse VM.

## 2014-12-02 NOTE — Telephone Encounter (Signed)
2nd voice mail from patient asking to have Loren Racer, [place] call her. She feels she needs an appointment. In basket message to CSW.

## 2014-12-02 NOTE — Telephone Encounter (Signed)
Received message from Manuela Schwartz, navigator re:  Pt called stating that her hair dresser found red bumps from scalp to back of neck area, and itching.  Pt wanted to know if Dr. Burr Medico would suggest anything to help with the itch.   Called pt back and left message on voice mail re:  Dr. Burr Medico suggested pt can take Benadryl po, and can also use Benadryl lotion on the bumps.  Informed pt to monitor for drowsiness when taking Benadryl.

## 2014-12-03 ENCOUNTER — Encounter: Payer: Self-pay | Admitting: *Deleted

## 2014-12-03 NOTE — Progress Notes (Signed)
[place] Work  Clinical Social Work was referred by patient navigator for assessment of psychosocial needs.  Clinical Social Worker attempted to contact patient at home and left vm to return CSW call.  Loren Racer, [place] Worker [place]  [place] Phone: [phone] Fax: [phone]

## 2014-12-07 ENCOUNTER — Other Ambulatory Visit: Payer: Self-pay

## 2014-12-07 NOTE — Patient Outreach (Signed)
I called Amber Bishop to follow up on her Extra Help application.  I had to leave a HIPPA compliant message for her to call me back.  If I do not hear from her by the end of the week, I will reach out to her again.   Deanne Coffer, PharmD, [place] [phone]

## 2014-12-08 ENCOUNTER — Ambulatory Visit (HOSPITAL_BASED_OUTPATIENT_CLINIC_OR_DEPARTMENT_OTHER): Payer: PPO | Admitting: Pharmacist

## 2014-12-08 ENCOUNTER — Ambulatory Visit (HOSPITAL_BASED_OUTPATIENT_CLINIC_OR_DEPARTMENT_OTHER): Payer: PPO

## 2014-12-08 ENCOUNTER — Other Ambulatory Visit (HOSPITAL_BASED_OUTPATIENT_CLINIC_OR_DEPARTMENT_OTHER): Payer: PPO

## 2014-12-08 VITALS — BP 149/91 | HR 81 | Temp 99.0°F | Resp 20

## 2014-12-08 DIAGNOSIS — Z86718 Personal history of other venous thrombosis and embolism: Secondary | ICD-10-CM

## 2014-12-08 DIAGNOSIS — C259 Malignant neoplasm of pancreas, unspecified: Secondary | ICD-10-CM

## 2014-12-08 DIAGNOSIS — Z5111 Encounter for antineoplastic chemotherapy: Secondary | ICD-10-CM | POA: Diagnosis not present

## 2014-12-08 DIAGNOSIS — I82401 Acute embolism and thrombosis of unspecified deep veins of right lower extremity: Secondary | ICD-10-CM

## 2014-12-08 DIAGNOSIS — C251 Malignant neoplasm of body of pancreas: Secondary | ICD-10-CM | POA: Diagnosis not present

## 2014-12-08 DIAGNOSIS — C787 Secondary malignant neoplasm of liver and intrahepatic bile duct: Secondary | ICD-10-CM

## 2014-12-08 LAB — CBC WITH DIFFERENTIAL/PLATELET
BASO%: 0.6 % (ref 0.0–2.0)
Basophils Absolute: 0 10*3/uL (ref 0.0–0.1)
EOS%: 2.5 % (ref 0.0–7.0)
Eosinophils Absolute: 0.2 10*3/uL (ref 0.0–0.5)
HCT: 38.8 % (ref 34.8–46.6)
HGB: 12.8 g/dL (ref 11.6–15.9)
LYMPH%: 20.2 % (ref 14.0–49.7)
MCH: 29.4 pg (ref 25.1–34.0)
MCHC: 33 g/dL (ref 31.5–36.0)
MCV: 89.2 fL (ref 79.5–101.0)
MONO#: 0.7 10*3/uL (ref 0.1–0.9)
MONO%: 11.2 % (ref 0.0–14.0)
NEUT#: 4.2 10*3/uL (ref 1.5–6.5)
NEUT%: 65.5 % (ref 38.4–76.8)
PLATELETS: 131 10*3/uL — AB (ref 145–400)
RBC: 4.35 10*6/uL (ref 3.70–5.45)
RDW: 16.4 % — ABNORMAL HIGH (ref 11.2–14.5)
WBC: 6.5 10*3/uL (ref 3.9–10.3)
lymph#: 1.3 10*3/uL (ref 0.9–3.3)

## 2014-12-08 LAB — PROTIME-INR
INR: 1.7 — AB (ref 2.00–3.50)
Protime: 20.4 Seconds — ABNORMAL HIGH (ref 10.6–13.4)

## 2014-12-08 LAB — COMPREHENSIVE METABOLIC PANEL (CC13)
ALT: 109 U/L — AB (ref 0–55)
AST: 78 U/L — ABNORMAL HIGH (ref 5–34)
Albumin: 3.4 g/dL — ABNORMAL LOW (ref 3.5–5.0)
Alkaline Phosphatase: 300 U/L — ABNORMAL HIGH (ref 40–150)
Anion Gap: 9 mEq/L (ref 3–11)
BUN: 14.2 mg/dL (ref 7.0–26.0)
CO2: 26 mEq/L (ref 22–29)
CREATININE: 0.9 mg/dL (ref 0.6–1.1)
Calcium: 9.7 mg/dL (ref 8.4–10.4)
Chloride: 102 mEq/L (ref 98–109)
EGFR: 66 mL/min/{1.73_m2} — ABNORMAL LOW (ref >90)
Glucose: 301 mg/dl — ABNORMAL HIGH (ref 70–140)
POTASSIUM: 3.7 meq/L (ref 3.5–5.1)
Sodium: 137 mEq/L (ref 136–145)
TOTAL PROTEIN: 6.4 g/dL (ref 6.4–8.3)
Total Bilirubin: 0.6 mg/dL (ref 0.20–1.20)

## 2014-12-08 LAB — POCT INR: INR: 1.7

## 2014-12-08 MED ORDER — PACLITAXEL PROTEIN-BOUND CHEMO INJECTION 100 MG
60.0000 mg/m2 | Freq: Once | INTRAVENOUS | Status: AC
  Administered 2014-12-08: 125 mg via INTRAVENOUS
  Filled 2014-12-08: qty 25

## 2014-12-08 MED ORDER — SODIUM CHLORIDE 0.9 % IV SOLN
Freq: Once | INTRAVENOUS | Status: AC
  Administered 2014-12-08: 12:00:00 via INTRAVENOUS
  Filled 2014-12-08: qty 4

## 2014-12-08 MED ORDER — SODIUM CHLORIDE 0.9 % IV SOLN
1254.0000 mg | Freq: Once | INTRAVENOUS | Status: AC
  Administered 2014-12-08: 1254 mg via INTRAVENOUS
  Filled 2014-12-08: qty 32.98

## 2014-12-08 MED ORDER — SODIUM CHLORIDE 0.9 % IJ SOLN
10.0000 mL | INTRAMUSCULAR | Status: DC | PRN
  Administered 2014-12-08: 10 mL
  Filled 2014-12-08: qty 10

## 2014-12-08 MED ORDER — SODIUM CHLORIDE 0.9 % IV SOLN
Freq: Once | INTRAVENOUS | Status: AC
  Administered 2014-12-08: 12:00:00 via INTRAVENOUS

## 2014-12-08 MED ORDER — HEPARIN SOD (PORK) LOCK FLUSH 100 UNIT/ML IV SOLN
500.0000 [IU] | Freq: Once | INTRAVENOUS | Status: AC | PRN
  Administered 2014-12-08: 500 [IU]
  Filled 2014-12-08: qty 5

## 2014-12-08 NOTE — Progress Notes (Signed)
INR below goal today at 1.7 (goal 2-2.5) Pt seen in infusion area Pt is doing well with no complaint No missed or extra doses reported No change in diet or medications No unusual bleeding or bruising Pt states she was instructed to get compression stockings and she states she will do this Discussed plan with Dr. Burr Medico. No Lovenox needed at this time. Will give a booster dose of coumadin tonight  Plan: Take 11.25 mg tonight only (1.5 tabs) then Continue taking Coumadin (warfarin) 7.5 mg every day. Return to Coumadin Clinic on 12/15/14: Lab at 12:15 pm, MD at 1pm and chemotherapy 2pm, Coumadin Clinic at 2:15 mm-- we will see you in infusion

## 2014-12-08 NOTE — Patient Instructions (Signed)
INR below goal Plan: Take 11.25 mg tonight only (1.5 tabs) then Continue taking Coumadin (warfarin) 7.5 mg every day. Return to Coumadin Clinic on 12/15/14: Lab at 12:15 pm, MD at 1pm and chemotherapy 2pm, Coumadin Clinic at 2:15 mm-- we will see you in infusion

## 2014-12-08 NOTE — Patient Instructions (Signed)
[facility] Discharge Instructions for Patients Receiving Chemotherapy  Today you received the following chemotherapy agents:  Abraxane and Gemzar  To help prevent nausea and vomiting after your treatment, we encourage you to take your nausea medication as ordered per MD.   If you develop nausea and vomiting that is not controlled by your nausea medication, call the clinic.   BELOW ARE SYMPTOMS THAT SHOULD BE REPORTED IMMEDIATELY:  *FEVER GREATER THAN 100.5 F  *CHILLS WITH OR WITHOUT FEVER  NAUSEA AND VOMITING THAT IS NOT CONTROLLED WITH YOUR NAUSEA MEDICATION  *UNUSUAL SHORTNESS OF BREATH  *UNUSUAL BRUISING OR BLEEDING  TENDERNESS IN MOUTH AND THROAT WITH OR WITHOUT PRESENCE OF ULCERS  *URINARY PROBLEMS  *BOWEL PROBLEMS  UNUSUAL RASH Items with * indicate a potential emergency and should be followed up as soon as possible.  Feel free to call the clinic you have any questions or concerns. The clinic phone number is [phone].  Please show the [place] at check-in to the Emergency Department and triage nurse.

## 2014-12-10 ENCOUNTER — Telehealth: Payer: Self-pay | Admitting: Hematology

## 2014-12-10 NOTE — Telephone Encounter (Signed)
per Kolleen to sch CC-pt aware °

## 2014-12-15 ENCOUNTER — Ambulatory Visit (HOSPITAL_BASED_OUTPATIENT_CLINIC_OR_DEPARTMENT_OTHER): Payer: PPO

## 2014-12-15 ENCOUNTER — Encounter: Payer: Self-pay | Admitting: Hematology

## 2014-12-15 ENCOUNTER — Telehealth: Payer: Self-pay | Admitting: Hematology

## 2014-12-15 ENCOUNTER — Ambulatory Visit (HOSPITAL_BASED_OUTPATIENT_CLINIC_OR_DEPARTMENT_OTHER): Payer: PPO | Admitting: Pharmacist

## 2014-12-15 ENCOUNTER — Encounter: Payer: Self-pay | Admitting: *Deleted

## 2014-12-15 ENCOUNTER — Ambulatory Visit (HOSPITAL_BASED_OUTPATIENT_CLINIC_OR_DEPARTMENT_OTHER): Payer: PPO | Admitting: Hematology

## 2014-12-15 ENCOUNTER — Telehealth: Payer: Self-pay | Admitting: *Deleted

## 2014-12-15 ENCOUNTER — Other Ambulatory Visit (HOSPITAL_BASED_OUTPATIENT_CLINIC_OR_DEPARTMENT_OTHER): Payer: PPO

## 2014-12-15 VITALS — BP 153/89 | HR 89 | Temp 98.8°F | Resp 18 | Ht 64.5 in | Wt 222.2 lb

## 2014-12-15 DIAGNOSIS — C787 Secondary malignant neoplasm of liver and intrahepatic bile duct: Secondary | ICD-10-CM

## 2014-12-15 DIAGNOSIS — C251 Malignant neoplasm of body of pancreas: Secondary | ICD-10-CM

## 2014-12-15 DIAGNOSIS — Z5111 Encounter for antineoplastic chemotherapy: Secondary | ICD-10-CM

## 2014-12-15 DIAGNOSIS — Z86718 Personal history of other venous thrombosis and embolism: Secondary | ICD-10-CM

## 2014-12-15 DIAGNOSIS — C259 Malignant neoplasm of pancreas, unspecified: Secondary | ICD-10-CM

## 2014-12-15 DIAGNOSIS — I82401 Acute embolism and thrombosis of unspecified deep veins of right lower extremity: Secondary | ICD-10-CM

## 2014-12-15 LAB — COMPREHENSIVE METABOLIC PANEL (CC13)
ALT: 119 U/L — AB (ref 0–55)
AST: 90 U/L — ABNORMAL HIGH (ref 5–34)
Albumin: 3.2 g/dL — ABNORMAL LOW (ref 3.5–5.0)
Alkaline Phosphatase: 235 U/L — ABNORMAL HIGH (ref 40–150)
Anion Gap: 9 mEq/L (ref 3–11)
BUN: 12.4 mg/dL (ref 7.0–26.0)
CO2: 27 meq/L (ref 22–29)
Calcium: 9.1 mg/dL (ref 8.4–10.4)
Chloride: 103 mEq/L (ref 98–109)
Creatinine: 0.8 mg/dL (ref 0.6–1.1)
EGFR: 81 mL/min/{1.73_m2} — ABNORMAL LOW (ref >90)
Glucose: 248 mg/dl — ABNORMAL HIGH (ref 70–140)
Potassium: 3.9 mEq/L (ref 3.5–5.1)
Sodium: 138 mEq/L (ref 136–145)
TOTAL PROTEIN: 5.9 g/dL — AB (ref 6.4–8.3)
Total Bilirubin: 0.52 mg/dL (ref 0.20–1.20)

## 2014-12-15 LAB — CBC WITH DIFFERENTIAL/PLATELET
BASO%: 1 % (ref 0.0–2.0)
BASOS ABS: 0 10*3/uL (ref 0.0–0.1)
EOS%: 2.7 % (ref 0.0–7.0)
Eosinophils Absolute: 0.1 10*3/uL (ref 0.0–0.5)
HEMATOCRIT: 36.3 % (ref 34.8–46.6)
HEMOGLOBIN: 11.8 g/dL (ref 11.6–15.9)
LYMPH#: 1.3 10*3/uL (ref 0.9–3.3)
LYMPH%: 30.6 % (ref 14.0–49.7)
MCH: 28.8 pg (ref 25.1–34.0)
MCHC: 32.4 g/dL (ref 31.5–36.0)
MCV: 88.8 fL (ref 79.5–101.0)
MONO#: 0.6 10*3/uL (ref 0.1–0.9)
MONO%: 13 % (ref 0.0–14.0)
NEUT#: 2.2 10*3/uL (ref 1.5–6.5)
NEUT%: 52.7 % (ref 38.4–76.8)
Platelets: 159 10*3/uL (ref 145–400)
RBC: 4.09 10*6/uL (ref 3.70–5.45)
RDW: 16.8 % — AB (ref 11.2–14.5)
WBC: 4.2 10*3/uL (ref 3.9–10.3)

## 2014-12-15 LAB — PROTIME-INR
INR: 1.8 — AB (ref 2.00–3.50)
PROTIME: 21.6 s — AB (ref 10.6–13.4)

## 2014-12-15 LAB — POCT INR: INR: 1.8

## 2014-12-15 MED ORDER — PACLITAXEL PROTEIN-BOUND CHEMO INJECTION 100 MG
40.0000 mg/m2 | Freq: Once | INTRAVENOUS | Status: AC
  Administered 2014-12-15: 75 mg via INTRAVENOUS
  Filled 2014-12-15: qty 15

## 2014-12-15 MED ORDER — SODIUM CHLORIDE 0.9 % IV SOLN
600.0000 mg/m2 | Freq: Once | INTRAVENOUS | Status: AC
  Administered 2014-12-15: 1254 mg via INTRAVENOUS
  Filled 2014-12-15: qty 32.98

## 2014-12-15 MED ORDER — SODIUM CHLORIDE 0.9 % IJ SOLN
10.0000 mL | INTRAMUSCULAR | Status: DC | PRN
Start: 2014-12-15 — End: 2015-03-08
  Administered 2014-12-15: 10 mL
  Filled 2014-12-15: qty 10

## 2014-12-15 MED ORDER — SODIUM CHLORIDE 0.9 % IV SOLN
Freq: Once | INTRAVENOUS | Status: AC
  Administered 2014-12-15: 15:00:00 via INTRAVENOUS
  Filled 2014-12-15: qty 4

## 2014-12-15 MED ORDER — SODIUM CHLORIDE 0.9 % IV SOLN
Freq: Once | INTRAVENOUS | Status: AC
  Administered 2014-12-15: 15:00:00 via INTRAVENOUS

## 2014-12-15 MED ORDER — HEPARIN SOD (PORK) LOCK FLUSH 100 UNIT/ML IV SOLN
500.0000 [IU] | Freq: Once | INTRAVENOUS | Status: AC | PRN
  Administered 2014-12-15: 500 [IU]
  Filled 2014-12-15: qty 5

## 2014-12-15 MED ORDER — LIDOCAINE-PRILOCAINE 2.5-2.5 % EX CREA: 1.0000 "application " | TOPICAL_CREAM | CUTANEOUS | Status: AC | PRN

## 2014-12-15 NOTE — Patient Instructions (Signed)
[facility] Discharge Instructions for Patients Receiving Chemotherapy  Today you received the following chemotherapy agents:  Abraxane and Gemzar  To help prevent nausea and vomiting after your treatment, we encourage you to take your nausea medication as ordered per MD.   If you develop nausea and vomiting that is not controlled by your nausea medication, call the clinic.   BELOW ARE SYMPTOMS THAT SHOULD BE REPORTED IMMEDIATELY:  *FEVER GREATER THAN 100.5 F  *CHILLS WITH OR WITHOUT FEVER  NAUSEA AND VOMITING THAT IS NOT CONTROLLED WITH YOUR NAUSEA MEDICATION  *UNUSUAL SHORTNESS OF BREATH  *UNUSUAL BRUISING OR BLEEDING  TENDERNESS IN MOUTH AND THROAT WITH OR WITHOUT PRESENCE OF ULCERS  *URINARY PROBLEMS  *BOWEL PROBLEMS  UNUSUAL RASH Items with * indicate a potential emergency and should be followed up as soon as possible.  Feel free to call the clinic you have any questions or concerns. The clinic phone number is [phone].  Please show the [place] at check-in to the Emergency Department and triage nurse.

## 2014-12-15 NOTE — Patient Instructions (Signed)
Take 11.25 mg (1.5 tabs) on Wed and Sat and take 7.5mg  (1 tab) the other days.  Return to Coumadin Clinic on 12/22/14: Lab at 8:45 am, MD at 9:15 and Coumadin Clinic at 9:45 am.

## 2014-12-15 NOTE — Progress Notes (Signed)
Pt saw Dr. Burr Medico today prior to chemo.  OK to treat as per md.

## 2014-12-15 NOTE — Telephone Encounter (Signed)
Per staff message and POF I have scheduled appts. Advised scheduler of appts. JMW  

## 2014-12-15 NOTE — Addendum Note (Signed)
Addended by: Adalberto Cole on: 12/15/2014 03:04 PM   Modules accepted: Orders

## 2014-12-15 NOTE — Progress Notes (Signed)
OK to treat with elevated AST/ALT per Dr. Burr Medico

## 2014-12-15 NOTE — Telephone Encounter (Signed)
per pof to sch pt appt-gave pt copy of avs-gave pt contrast for scans

## 2014-12-15 NOTE — Telephone Encounter (Signed)
Spoke with case manager, Jettie Booze at [phone]. She has no known resources to help with her insulin co pay. Suggests referral to Diabetes Nurse, who may be aware of resources. I will inquire with CSW if she would be an appropriate referral for [facility] w/diabetes and be able to use their pharmacy. Will contact her endocrinologist to F/U on their thoughts. Called office of Dr. Layla Maw and they have not had contact with [place] since March 2016. Will call patient tomorrow to determine if she has changed physicians.

## 2014-12-15 NOTE — Progress Notes (Signed)
Pt seen during infusion INR=1.8 on 7.5 mg daily with 11.25 mg on Wed Pt states she has no changes in meds or diets Her last chemo "wiped her out on day 3" Pt is confused when she is due back.  INR is slow to rise per patient Plan to increase slightly to 11.25 two days a week  She will RTC in one week for a f-u visit with MD at 9:45 (lab) and 10:00 Coumadin Clinic.

## 2014-12-15 NOTE — Progress Notes (Signed)
[place]  [phone] Fax:([phone]) [place] Note   Patient Care Team: Harlan Stains, MD as PCP - General (Family Medicine) Truitt Merle, MD as Consulting Physician (Hematology and Oncology) Delrae Rend, MD as Consulting Physician (Endocrinology) Tania Ade, RN as Registered Nurse (Oncology) Gaynelle Adu, [facility] as [facility] (Pharmacist) 12/15/2014  CHIEF COMPLAINTS:  Follow up thrombocytopenia and metastatic pancreatic cancer  Oncology History   Pancreatic cancer metastasized to liver   Staging form: Pancreas, AJCC 7th Edition     Clinical: Stage IV (T2, NX, M1) - Unsigned       Pancreatic cancer metastasized to liver   08/10/2014 Imaging CT abdomen showed 5.4 x 3.2 cm pancreatic body mass consistent with pancreatic adenocarcinoma. There is adjacent lymphadenopathy and diffuse  hepatic metastatic disease.   08/17/2014 Pathology Results Liver biopsy showed adenocarcinoma, consistent with pancreatic primary.   08/17/2014 Initial Diagnosis Pancreatic cancer metastasized to liver   08/24/2014 Tumor Marker CA19.9 >140000   08/31/2014 Imaging CT chest was negative for   09/02/2014 - 10/14/2014 Chemotherapy mFOLFOX, 4 cycles    10/26/2014 Progression CT chest, abdomen and pelvis showed probable disease progression in the liver and a stable primary pancreatic body mass.   11/02/2014 -  Chemotherapy Second line chemotherapy with gemcitabine and Abraxane   11/06/2014 - 11/15/2014 Hospital Admission She was admitted for right lower extremity DVT and cellulitis. She was discharged home with Coumadin and Lovenox bridging.    HISTORY OF PRESENTING ILLNESS:  Amber Bishop 64 y.o. female was referred by her primary care physician Dr. Dema Severin for thrombocytopenia. She also was recently diagnosed with metastatic pancreatic cancer.   She has had a chronic thrombocytopenia since 2013. Her plt count has been in the range of 100-150,  but dropped to the range of 80s lately. She denies any signs of bleeding. No history of liver disease.  She has been diabetic for 24 years, and her blood glucose has been out of control in the past one year. She also reports fatigue in the past one year. She denies any significant abdominal pain, jaundice, bloating, or change of her bowel habits. Her appetite has been normal. No nausea or vomiting. She is able to do all selfcare, but she sleeps more than usually during the day. No abdominal pain or bloating. She was found to have abnormal LFTs on routine lab work, and her PCP Dr. Dema Severin, who ordered and abdominal US which lead to a CT scan, which showed a 5.4 x 3.4 cm pancreatic body mass, adjacent lymphadenopathy, and diffuse hepatic metastasis. She underwent ultrasound-guided liver biopsy on 08/17/2014, and the biopsy showed adenocarcinoma consistent with pancreatic primary.   INTERIM HISTORY: Amber Bishop returns for follow-up. She restarts Abraxane, in addition to gemcitabine last week. She had profound weakness on day 3 after chemotherapy with low appetite, she stayed in the bed for half-day and she recovered quickly within a day, and she is back to her normal good appetite and energy level. She denies any fever or chills, no bleeding, no nausea, neuropathy or other new complaints.   MEDICAL HISTORY:  Past Medical History  Diagnosis Date  . Anxiety   . Hypertension   . IBS (irritable bowel syndrome)   . Hyperplastic colon polyp   . Allergy   . Anemia   . Arthritis   . Diverticulosis   . Dilated aortic root     seen on prior echo but echo 05/2013 showed normal dimensions  .  Bicuspid aortic valve   . LVE (left ventricular enlargement)     mild by echo 1/15 with EF 50-55%  . pancreatic ca w/ liver mets dx'd 07/2014  . Diabetes mellitus without complication     SURGICAL HISTORY: Past Surgical History  Procedure Laterality Date  . Tubal ligation      age 55  . Colonoscopy    . Polypectomy      . Tooth extraction      3 teeth  . Belpharoptosis repair      SOCIAL HISTORY: History   Social History  . Marital Status: Single    Spouse Name: N/A  . Number of Children: 1 son   . Years of Education: N/A   Occupational History  . Retired home care [employer]  . Smoking status: Never Smoker   . Smokeless tobacco: Never Used  . Alcohol Use: No  . Drug Use: No  . Sexual Activity: Not on file   Other Topics Concern  . Not on file   Social History Narrative    FAMILY HISTORY: Family History  Problem Relation Age of Onset  . Colon cancer Neg Hx   . Esophageal cancer Neg Hx   . Stomach cancer Neg Hx   . Asthma Mother     ALLERGIES:  is allergic to naprosyn and metformin and related.  MEDICATIONS:  Current Outpatient Prescriptions  Medication Sig Dispense Refill  . ALPRAZolam (XANAX) 0.5 MG tablet TAKE 1 TABLET THREE TIMES DAILY IF NEEDED FOR ANXIETY 90 tablet 3  . amLODipine (NORVASC) 5 MG tablet Take 5 mg by mouth every morning.     . blood glucose meter kit and supplies KIT Dispense based on patient and insurance preference. Use up to four times daily as directed. Dx code: E11.65 1 each 0  . Calcium Carbonate-Vitamin D (CALCIUM 600/VITAMIN D) 600-400 MG-UNIT per tablet Take 1 tablet by mouth 2 (two) times daily.     . cetirizine (ZYRTEC) 10 MG tablet Take 10 mg by mouth daily.    . cholestyramine (QUESTRAN) 4 G packet Take 1/2-1 packet dissolved in water/juice once daily....................Marland KitchenPRN (Patient taking differently: Take 2-4 g by mouth daily as needed (IBS). ) 90 each 3  . dextromethorphan (DELSYM) 30 MG/5ML liquid Take 15 mg by mouth 2 (two) times daily as needed for cough.    . diphenhydrAMINE-zinc acetate (BENADRYL) cream Apply topically 2 (two) times daily as needed for itching. 28.4 g 0  . FLUoxetine (PROZAC) 20 MG capsule Take 20 mg by mouth every morning.     Marland Kitchen glucose blood (ACCU-CHEK AVIVA PLUS) test strip Test 4 times daily  as instructed. 125 each 3  . insulin aspart (NOVOLOG FLEXPEN) 100 UNIT/ML FlexPen Inject 14-18 units into the skin 3 times daily as instructed. (Patient taking differently: Inject 20-28 Units into the skin 3 (three) times daily with meals. ) 15 mL 2  . insulin glargine (LANTUS) 100 UNIT/ML injection Inject 74 Units into the skin at bedtime.     . Insulin Pen Needle (VALUMARK PEN NEEDLES) 31G X 8 MM MISC Use to inject insulin 4 times daily as instructed. 150 each 3  . LANCETS ULTRA THIN 30G MISC Use to test blood sugar 4 times daily as instructed. Dx code: E11.65 200 each 2  . lidocaine-prilocaine (EMLA) cream Apply 1 application topically as needed. 30 g 3  . Multiple Vitamins-Minerals (CENTRUM SILVER PO) Take 1 capsule by mouth every morning.     Marland Kitchen  Omega-3 Fatty Acids (FISH OIL) 1000 MG CAPS Take 1 capsule by mouth every morning.     Marland Kitchen OVER THE COUNTER MEDICATION Apply 1 drop topically 3 (three) times daily. Essential Oils.    . valsartan-hydrochlorothiazide (DIOVAN-HCT) 320-12.5 MG per tablet Take 1 tablet by mouth every morning.     . warfarin (COUMADIN) 7.5 MG tablet Take 1 tablet (7.5 mg total) by mouth daily. 30 tablet 1   No current facility-administered medications for this visit.    REVIEW OF SYSTEMS:   Constitutional: Denies fevers, chills or abnormal night sweats Eyes: Denies blurriness of vision, double vision or watery eyes Ears, nose, mouth, throat, and face: Denies mucositis or sore throat Respiratory: Denies cough, dyspnea or wheezes Cardiovascular: Denies palpitation, chest discomfort or lower extremity swelling Gastrointestinal:  Denies nausea, heartburn or change in bowel habits Skin: Denies abnormal skin rashes Lymphatics: Denies new lymphadenopathy or easy bruising Neurological:Denies numbness, tingling or new weaknesses Behavioral/Psych: Mood is stable, no new changes  All other systems were reviewed with the patient and are negative.   PHYSICAL EXAMINATION: ECOG  PERFORMANCE STATUS: 1 - Symptomatic but completely ambulatory  Filed Vitals:   12/15/14 1315  BP: 153/89  Pulse: 89  Temp: 98.8 F (37.1 C)  Resp: 18   Filed Weights   12/15/14 1315  Weight: 222 lb 3.2 oz (100.789 kg)    GENERAL:alert, no distress and comfortable SKIN: skin color, texture, turgor are normal, no rashes or significant lesions except (+) mild skin pigmentation in the right lower extremity EYES: normal, conjunctiva are pink and non-injected, sclera clear OROPHARYNX:no exudate, no erythema and lips, buccal mucosa, and tongue normal  NECK: supple, thyroid normal size, non-tender, without nodularity LYMPH:  no palpable lymphadenopathy in the cervical, axillary or inguinal LUNGS: clear to auscultation and percussion with normal breathing effort HEART: regular rate & rhythm and no murmurs and no lower extremity edema ABDOMEN:abdomen soft, non-tender and normal bowel sounds Musculoskeletal:no cyanosis of digits and no clubbing. (+) Mild pitting edema in the right lower extremity above the ankle. PSYCH: alert & oriented x 3 with fluent speech NEURO: no focal motor/sensory deficits  LABORATORY DATA:  I have reviewed the data as listed CBC Latest Ref Rng 12/15/2014 12/08/2014 12/01/2014  WBC 3.9 - 10.3 10e3/uL 4.2 6.5 4.5  Hemoglobin 11.6 - 15.9 g/dL 11.8 12.8 12.0  Hematocrit 34.8 - 46.6 % 36.3 38.8 37.4  Platelets 145 - 400 10e3/uL 159 131(L) 99(L)    CMP Latest Ref Rng 12/15/2014 12/08/2014 12/01/2014  Glucose 70 - 140 mg/dl 248(H) 301(H) 187(H)  BUN 7.0 - 26.0 mg/dL 12.4 14.2 11.3  Creatinine 0.6 - 1.1 mg/dL 0.8 0.9 0.8  Sodium 136 - 145 mEq/L 138 137 137  Potassium 3.5 - 5.1 mEq/L 3.9 3.7 3.9  Chloride 101 - 111 mmol/L - - -  CO2 22 - 29 mEq/L 27 26 30(H)  Calcium 8.4 - 10.4 mg/dL 9.1 9.7 9.5  Total Protein 6.4 - 8.3 g/dL 5.9(L) 6.4 6.0(L)  Total Bilirubin 0.20 - 1.20 mg/dL 0.52 0.60 0.55  Alkaline Phos 40 - 150 U/L 235(H) 300(H) 260(H)  AST 5 - 34 U/L 90(H) 78(H)  104(H)  ALT 0 - 55 U/L 119(H) 109(H) 156(H)    PATHOLOGY REPORT: Diagnosis 08/17/2014 Liver, needle/core biopsy, left - ADENOCARCINOMA. Microscopic Comment The morphologic features are compatible with metastatic pancreatic adenocarcinoma.  RADIOGRAPHIC STUDIES: I have personally reviewed the radiological images as listed and agreed with the findings in the report.  Ct Abdomen Pelvis W  Wo Contrast 08/10/2014    IMPRESSION: 1. 5.4 x 3.2 cm pancreatic body mass consistent with pancreatic adenocarcinoma. There is adjacent lymphadenopathy and diffuse hepatic metastatic disease. The portal vein is patent. 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 2. Very geographic fatty infiltration of the liver. 3. Left-sided biliary dilatation. There is likely meta tumor compressing the left hepatic duct. 4. Uterine fibroids.   Electronically Signed   By: Marijo Sanes M.D.   On: 08/10/2014 14:14      CT chet 08/31/2014 IMPRESSION: No findings specific for metastatic disease in the chest.  3 mm right lower lobe pulmonary nodule favors a benign subpleural lymph node. Attention on follow-up is suggested.  3.1 x 4.6 cm mass in the pancreatic body, corresponding to known pancreatic cancer.  Multifocal hepatic metastases, incompletely visualized.   ASSESSMENT & PLAN:  64 year old Caucasian female, with past medical history of diabetes, hypertension, IBS, arthritis, who was found to have worsening thrombocytopenia, and a newly diagnosed metastatic rectal cancer.  1. Pancreatic cancer with liver metastasis -I reviewed her initial CT chest, abdomen and pelvis scan findings in the liver mass biopsy results in great detail. The image was reviewed in person. -I discussed the incurable nature of metastatic pancreas cancer and overall poor prognosis. However I am little concerned about how much she really grasp about this. She does have very positive  Attitude  -I recommend  systemic chemotherapy. The goal of treatment is palliation and to prolong her life. -I discussed the option of single agent gemcitabine, gemcitabine with Abraxane, FOLFOX or FOLFIRINOX -Due to her significant thrombocytopenia, I think gemcitabine-based regimen would be difficult to tolerate. I recommend a FOLFOX as first line therapy. Side effects of chemotherapy therapy were discussed with patient and she agrees to proceed. -She is currently on second line gemcitabine and Abraxane, due to disease progression -She had a profound fatigue after we resumed Abraxane last week, we'll decrease her Abraxane dose to 40 mg/m, and continue gemcitabine dose. Reviewed, her liver enzymes high mild elevated, stable, adequate for treatment.   2. Thrombocytopenia -This appears to be chronic, she has no history of liver disease, CT scan did not reveal liver cirrhosis or spleenmagely. -This is possible ITP, but she did not respond well to a trial of prednisone -As a bone marrow disease is also possible. Giving the overall poor prognosis from a metastatic pancreas cancer, I did not recommend a bone marrow biopsy at this point. -Her platelet count recovered to normal after first dose of gemcitabine and Abraxane. -We'll continue monitoring  3. RLE DVT -on coumadin now  -follow up with our coumadin clinic   4. Elevated liver enzyme - this is likely secondary to her diffuse liver metastasis  - we'll be cautious when she is on Abraxane, I'll further reduce her dose of Abraxane due to her tolerance issue  - close monitoring   5. Diabetes, hypertension, IBS, anxiety -She will continue follow-up with her primary care physician -Close monitoring during chemotherapy  6. Social support -She lives alone, her ex-husband is supportive. She knows to call for help if needed  Plan -C2D8  Abraxane and gemcitabine today with further Abraxane dose reduction to 88m/m2  -I'll see her back next week -Restaging scan in 2  weeks before cycle 3  All questions were answered. The patient knows to call the clinic with any problems, questions or concerns. I spent 20 minutes counseling the patient face to face. The total time spent  in the appointment was 30 minutes and more than 50% was on counseling.      Truitt Merle, MD 12/15/2014 2:00 PM

## 2014-12-15 NOTE — Progress Notes (Signed)
Oncology Nurse Navigator Documentation  Oncology Nurse Navigator Flowsheets 12/15/2014  Navigator Encounter Type Treatment  Patient Visit Type Medonc  Treatment Phase Treatment  Barriers/Navigation Needs Financial-co pay for insulin and scans  Education Concerns with Finances/ Eligibility  Interventions Other;Medication assitance--[employer] discount cards printed for her insulins  Time Spent with Patient 56  Expresses that she is in donut hole with medications now and her co pay for Lantus Insulin pen (#10 pens=3000 units) is $300+ now and her Novolog pen (#5 pens=1500 units) is over $225. She is required to pay $200 up front for all her CT scans, which she is not able to do. Spoke with her case manager today from [facility], but does not recall her name or contact information, who was going to try to assist her. Was able to confirm with [facility] she is in donut hole and co pay is significant. Printed discount cards for both insulins from [facility] website and gave to patient to use at next fill. Left VM for [employer] to call me to discuss her needs. Will inquire with financial counselor if she can pay the up front co pays for scans on payment plan and to contact patient with information to arrange this.

## 2014-12-15 NOTE — Telephone Encounter (Signed)
per revised pof to sch pt appt-sent MW email to sch pt trmt-adv pt will call with appts once reply

## 2014-12-15 NOTE — Progress Notes (Signed)
OK to hang dose of Abraxane per [facility].

## 2014-12-17 ENCOUNTER — Other Ambulatory Visit: Payer: Self-pay

## 2014-12-17 DIAGNOSIS — Z1231 Encounter for screening mammogram for malignant neoplasm of breast: Secondary | ICD-10-CM

## 2014-12-21 ENCOUNTER — Ambulatory Visit: Payer: PPO | Admitting: Hematology

## 2014-12-21 ENCOUNTER — Other Ambulatory Visit: Payer: PPO

## 2014-12-22 ENCOUNTER — Ambulatory Visit (HOSPITAL_BASED_OUTPATIENT_CLINIC_OR_DEPARTMENT_OTHER): Payer: PPO | Admitting: Pharmacist

## 2014-12-22 ENCOUNTER — Other Ambulatory Visit (HOSPITAL_BASED_OUTPATIENT_CLINIC_OR_DEPARTMENT_OTHER): Payer: PPO

## 2014-12-22 ENCOUNTER — Ambulatory Visit (HOSPITAL_BASED_OUTPATIENT_CLINIC_OR_DEPARTMENT_OTHER): Payer: PPO | Admitting: Hematology

## 2014-12-22 ENCOUNTER — Encounter: Payer: Self-pay | Admitting: Hematology

## 2014-12-22 ENCOUNTER — Ambulatory Visit (HOSPITAL_BASED_OUTPATIENT_CLINIC_OR_DEPARTMENT_OTHER): Payer: PPO

## 2014-12-22 ENCOUNTER — Telehealth: Payer: Self-pay | Admitting: Hematology

## 2014-12-22 ENCOUNTER — Other Ambulatory Visit: Payer: PPO

## 2014-12-22 ENCOUNTER — Ambulatory Visit: Payer: PPO | Admitting: Hematology

## 2014-12-22 VITALS — BP 147/81 | HR 82 | Temp 97.7°F | Resp 18 | Ht 64.5 in | Wt 223.7 lb

## 2014-12-22 DIAGNOSIS — D696 Thrombocytopenia, unspecified: Secondary | ICD-10-CM | POA: Diagnosis not present

## 2014-12-22 DIAGNOSIS — Z5111 Encounter for antineoplastic chemotherapy: Secondary | ICD-10-CM

## 2014-12-22 DIAGNOSIS — Z86718 Personal history of other venous thrombosis and embolism: Secondary | ICD-10-CM

## 2014-12-22 DIAGNOSIS — C251 Malignant neoplasm of body of pancreas: Secondary | ICD-10-CM

## 2014-12-22 DIAGNOSIS — C787 Secondary malignant neoplasm of liver and intrahepatic bile duct: Secondary | ICD-10-CM

## 2014-12-22 DIAGNOSIS — I82401 Acute embolism and thrombosis of unspecified deep veins of right lower extremity: Secondary | ICD-10-CM

## 2014-12-22 DIAGNOSIS — Z8679 Personal history of other diseases of the circulatory system: Secondary | ICD-10-CM

## 2014-12-22 DIAGNOSIS — C259 Malignant neoplasm of pancreas, unspecified: Secondary | ICD-10-CM

## 2014-12-22 LAB — COMPREHENSIVE METABOLIC PANEL (CC13)
ALBUMIN: 3.2 g/dL — AB (ref 3.5–5.0)
ALK PHOS: 231 U/L — AB (ref 40–150)
ALT: 105 U/L — ABNORMAL HIGH (ref 0–55)
ANION GAP: 6 meq/L (ref 3–11)
AST: 73 U/L — AB (ref 5–34)
BUN: 13.3 mg/dL (ref 7.0–26.0)
CO2: 29 meq/L (ref 22–29)
Calcium: 8.7 mg/dL (ref 8.4–10.4)
Chloride: 102 mEq/L (ref 98–109)
Creatinine: 0.9 mg/dL (ref 0.6–1.1)
EGFR: 70 mL/min/{1.73_m2} — ABNORMAL LOW (ref >90)
Glucose: 401 mg/dl — ABNORMAL HIGH (ref 70–140)
POTASSIUM: 3.8 meq/L (ref 3.5–5.1)
Sodium: 137 mEq/L (ref 136–145)
Total Bilirubin: 0.44 mg/dL (ref 0.20–1.20)
Total Protein: 6 g/dL — ABNORMAL LOW (ref 6.4–8.3)

## 2014-12-22 LAB — CBC WITH DIFFERENTIAL/PLATELET
BASO%: 1.2 % (ref 0.0–2.0)
Basophils Absolute: 0 10*3/uL (ref 0.0–0.1)
EOS%: 2.4 % (ref 0.0–7.0)
Eosinophils Absolute: 0.1 10*3/uL (ref 0.0–0.5)
HEMATOCRIT: 37.4 % (ref 34.8–46.6)
HGB: 11.9 g/dL (ref 11.6–15.9)
LYMPH%: 32.6 % (ref 14.0–49.7)
MCH: 28.4 pg (ref 25.1–34.0)
MCHC: 31.9 g/dL (ref 31.5–36.0)
MCV: 89.1 fL (ref 79.5–101.0)
MONO#: 0.5 10*3/uL (ref 0.1–0.9)
MONO%: 11.8 % (ref 0.0–14.0)
NEUT#: 2 10*3/uL (ref 1.5–6.5)
NEUT%: 52 % (ref 38.4–76.8)
PLATELETS: 100 10*3/uL — AB (ref 145–400)
RBC: 4.19 10*6/uL (ref 3.70–5.45)
RDW: 16.7 % — ABNORMAL HIGH (ref 11.2–14.5)
WBC: 3.9 10*3/uL (ref 3.9–10.3)
lymph#: 1.3 10*3/uL (ref 0.9–3.3)

## 2014-12-22 LAB — PROTIME-INR
INR: 2 (ref 2.00–3.50)
Protime: 24 Seconds — ABNORMAL HIGH (ref 10.6–13.4)

## 2014-12-22 LAB — POCT INR: INR: 2

## 2014-12-22 MED ORDER — PACLITAXEL PROTEIN-BOUND CHEMO INJECTION 100 MG
40.0000 mg/m2 | Freq: Once | INTRAVENOUS | Status: AC
  Administered 2014-12-22: 75 mg via INTRAVENOUS
  Filled 2014-12-22: qty 15

## 2014-12-22 MED ORDER — SODIUM CHLORIDE 0.9 % IJ SOLN
10.0000 mL | INTRAMUSCULAR | Status: DC | PRN
  Administered 2014-12-22: 10 mL
  Filled 2014-12-22: qty 10

## 2014-12-22 MED ORDER — HEPARIN SOD (PORK) LOCK FLUSH 100 UNIT/ML IV SOLN
500.0000 [IU] | Freq: Once | INTRAVENOUS | Status: AC | PRN
  Administered 2014-12-22: 500 [IU]
  Filled 2014-12-22: qty 5

## 2014-12-22 MED ORDER — SODIUM CHLORIDE 0.9 % IV SOLN
Freq: Once | INTRAVENOUS | Status: AC
  Administered 2014-12-22: 16:00:00 via INTRAVENOUS
  Filled 2014-12-22: qty 4

## 2014-12-22 MED ORDER — SODIUM CHLORIDE 0.9 % IV SOLN
Freq: Once | INTRAVENOUS | Status: AC
  Administered 2014-12-22: 16:00:00 via INTRAVENOUS

## 2014-12-22 MED ORDER — SODIUM CHLORIDE 0.9 % IV SOLN
600.0000 mg/m2 | Freq: Once | INTRAVENOUS | Status: AC
  Administered 2014-12-22: 1254 mg via INTRAVENOUS
  Filled 2014-12-22: qty 32.98

## 2014-12-22 NOTE — Progress Notes (Signed)
INR=2.  No bleeding or bruising.  No medication changes.  Amber Bishop is receiving chemo today.  Will continue current coumadin dose of 11.25mg  on Wed and Sat and 7.5mg  other days.  Will check PT/INR in 2 weeks with next MD/chemo appt.

## 2014-12-22 NOTE — Telephone Encounter (Signed)
per Melissa to sch CC-pt aware °

## 2014-12-22 NOTE — Progress Notes (Signed)
[place]  [phone] Fax:([phone] [place] Note   Patient Care Team: Harlan Stains, MD as PCP - General (Family Medicine) Truitt Merle, MD as Consulting Physician (Hematology and Oncology) Delrae Rend, MD as Consulting Physician (Endocrinology) Tania Ade, RN as Registered Nurse (Oncology) Gaynelle Adu, [facility] as [place] [facility] (Pharmacist) 12/22/2014  CHIEF COMPLAINTS:  Follow up thrombocytopenia and metastatic pancreatic cancer  Oncology History   Pancreatic cancer metastasized to liver   Staging form: Pancreas, AJCC 7th Edition     Clinical: Stage IV (T2, NX, M1) - Unsigned       Pancreatic cancer metastasized to liver   08/10/2014 Imaging CT abdomen showed 5.4 x 3.2 cm pancreatic body mass consistent with pancreatic adenocarcinoma. There is adjacent lymphadenopathy and diffuse  hepatic metastatic disease.   08/17/2014 Pathology Results Liver biopsy showed adenocarcinoma, consistent with pancreatic primary.   08/17/2014 Initial Diagnosis Pancreatic cancer metastasized to liver   08/24/2014 Tumor Marker CA19.9 >140000   08/31/2014 Imaging CT chest was negative for   09/02/2014 - 10/14/2014 Chemotherapy mFOLFOX, 4 cycles    10/26/2014 Progression CT chest, abdomen and pelvis showed probable disease progression in the liver and a stable primary pancreatic body mass.   11/02/2014 -  Chemotherapy Second line chemotherapy with gemcitabine and Abraxane   11/06/2014 - 11/15/2014 Hospital Admission She was admitted for right lower extremity DVT and cellulitis. She was discharged home with Coumadin and Lovenox bridging.    HISTORY OF PRESENTING ILLNESS:  Amber Bishop 64 y.o. female was referred by her primary care physician Dr. Dema Severin for thrombocytopenia. She also was recently diagnosed with metastatic pancreatic cancer.   She has had a chronic thrombocytopenia since 2013. Her plt count has been in the range of 100-150,  but dropped to the range of 80s lately. She denies any signs of bleeding. No history of liver disease.  She has been diabetic for 24 years, and her blood glucose has been out of control in the past one year. She also reports fatigue in the past one year. She denies any significant abdominal pain, jaundice, bloating, or change of her bowel habits. Her appetite has been normal. No nausea or vomiting. She is able to do all selfcare, but she sleeps more than usually during the day. No abdominal pain or bloating. She was found to have abnormal LFTs on routine lab work, and her PCP Dr. Dema Severin, who ordered and abdominal US which lead to a CT scan, which showed a 5.4 x 3.4 cm pancreatic body mass, adjacent lymphadenopathy, and diffuse hepatic metastasis. She underwent ultrasound-guided liver biopsy on 08/17/2014, and the biopsy showed adenocarcinoma consistent with pancreatic primary.   INTERIM HISTORY: Amber Bishop returns for follow-up. She tolerated dosed reduced Abraxane and gemcitabine much better last week, mild fatigue for  2 days after chemotherapy, recovered quickly,  Able to function well at home, no other noticeable side effects. She denies any pain, nausea, abdominal bloating, change of her bowel habits.  No fever , chill or bleeding.   MEDICAL HISTORY:  Past Medical History  Diagnosis Date  . Anxiety   . Hypertension   . IBS (irritable bowel syndrome)   . Hyperplastic colon polyp   . Allergy   . Anemia   . Arthritis   . Diverticulosis   . Dilated aortic root     seen on prior echo but echo 05/2013 showed normal dimensions  . Bicuspid aortic valve   . LVE (left  ventricular enlargement)     mild by echo 1/15 with EF 50-55%  . pancreatic ca w/ liver mets dx'd 07/2014  . Diabetes mellitus without complication     SURGICAL HISTORY: Past Surgical History  Procedure Laterality Date  . Tubal ligation      age 58  . Colonoscopy    . Polypectomy    . Tooth extraction      3 teeth  .  Belpharoptosis repair      SOCIAL HISTORY: History   Social History  . Marital Status: Single    Spouse Name: N/A  . Number of Children: 1 son   . Years of Education: N/A   Occupational History  . 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 Smoking status: Never Smoker   . Smokeless tobacco: Never Used  . Alcohol Use: No  . Drug Use: No  . Sexual Activity: Not on file   Other Topics Concern  . Not on file   Social History Narrative    FAMILY HISTORY: Family History  Problem Relation Age of Onset  . Colon cancer Neg Hx   . Esophageal cancer Neg Hx   . Stomach cancer Neg Hx   . Asthma Mother     ALLERGIES:  is allergic to naprosyn and metformin and related.  MEDICATIONS:  Current Outpatient Prescriptions  Medication Sig Dispense Refill  . ALPRAZolam (XANAX) 0.5 MG tablet TAKE 1 TABLET THREE TIMES DAILY IF NEEDED FOR ANXIETY 90 tablet 3  . amLODipine (NORVASC) 5 MG tablet Take 5 mg by mouth every morning.     . blood glucose meter kit and supplies KIT Dispense based on patient and insurance preference. Use up to four times daily as directed. Dx code: E11.65 1 each 0  . Calcium Carbonate-Vitamin D (CALCIUM 600/VITAMIN D) 600-400 MG-UNIT per tablet Take 1 tablet by mouth 2 (two) times daily.     . cetirizine (ZYRTEC) 10 MG tablet Take 10 mg by mouth daily.    . cholestyramine (QUESTRAN) 4 G packet Take 1/2-1 packet dissolved in water/juice once daily....................Marland KitchenPRN (Patient taking differently: Take 2-4 g by mouth daily as needed (IBS). ) 90 each 3  . dextromethorphan (DELSYM) 30 MG/5ML liquid Take 15 mg by mouth 2 (two) times daily as needed for cough.    . diphenhydrAMINE-zinc acetate (BENADRYL) cream Apply topically 2 (two) times daily as needed for itching. 28.4 g 0  . FLUoxetine (PROZAC) 20 MG capsule Take 20 mg by mouth every morning.     Marland Kitchen glucose blood (ACCU-CHEK AVIVA PLUS) test strip Test 4 times daily as instructed. 125 each 3  . insulin  aspart (NOVOLOG FLEXPEN) 100 UNIT/ML FlexPen Inject 14-18 units into the skin 3 times daily as instructed. (Patient taking differently: Inject 20-28 Units into the skin 3 (three) times daily with meals. ) 15 mL 2  . insulin glargine (LANTUS) 100 UNIT/ML injection Inject 74 Units into the skin at bedtime.     . Insulin Pen Needle (VALUMARK PEN NEEDLES) 31G X 8 MM MISC Use to inject insulin 4 times daily as instructed. 150 each 3  . LANCETS ULTRA THIN 30G MISC Use to test blood sugar 4 times daily as instructed. Dx code: E11.65 200 each 2  . lidocaine-prilocaine (EMLA) cream Apply 1 application topically as needed. 30 g 3  . Multiple Vitamins-Minerals (CENTRUM SILVER PO) Take 1 capsule by mouth every morning.     . Omega-3 Fatty Acids (FISH OIL) 1000  MG CAPS Take 1 capsule by mouth every morning.     Marland Kitchen OVER THE COUNTER MEDICATION Apply 1 drop topically 3 (three) times daily. Essential Oils.    . valsartan-hydrochlorothiazide (DIOVAN-HCT) 320-12.5 MG per tablet Take 1 tablet by mouth every morning.     . warfarin (COUMADIN) 7.5 MG tablet Take 1 tablet (7.5 mg total) by mouth daily. 30 tablet 1   No current facility-administered medications for this visit.   Facility-Administered Medications Ordered in Other Visits  Medication Dose Route Frequency Provider Last Rate Last Dose  . sodium chloride 0.9 % injection 10 mL  10 mL Intracatheter PRN Truitt Merle, MD   10 mL at 12/15/14 1701    REVIEW OF SYSTEMS:   Constitutional: Denies fevers, chills or abnormal night sweats Eyes: Denies blurriness of vision, double vision or watery eyes Ears, nose, mouth, throat, and face: Denies mucositis or sore throat Respiratory: Denies cough, dyspnea or wheezes Cardiovascular: Denies palpitation, chest discomfort or lower extremity swelling Gastrointestinal:  Denies nausea, heartburn or change in bowel habits Skin: Denies abnormal skin rashes Lymphatics: Denies new lymphadenopathy or easy  bruising Neurological:Denies numbness, tingling or new weaknesses Behavioral/Psych: Mood is stable, no new changes  All other systems were reviewed with the patient and are negative.   PHYSICAL EXAMINATION: ECOG PERFORMANCE STATUS: 1 - Symptomatic but completely ambulatory  Filed Vitals:   12/22/14 1339  BP: 147/81  Pulse: 82  Temp: 97.7 F (36.5 C)  Resp: 18   Filed Weights   12/22/14 1339  Weight: 223 lb 11.2 oz (101.47 kg)    GENERAL:alert, no distress and comfortable SKIN: skin color, texture, turgor are normal, no rashes or significant lesions except (+) mild skin pigmentation in the right lower extremity EYES: normal, conjunctiva are pink and non-injected, sclera clear OROPHARYNX:no exudate, no erythema and lips, buccal mucosa, and tongue normal  NECK: supple, thyroid normal size, non-tender, without nodularity LYMPH:  no palpable lymphadenopathy in the cervical, axillary or inguinal LUNGS: clear to auscultation and percussion with normal breathing effort HEART: regular rate & rhythm and no murmurs and no lower extremity edema ABDOMEN:abdomen soft, non-tender and normal bowel sounds Musculoskeletal:no cyanosis of digits and no clubbing. (+) Mild pitting edema in the right lower extremity above the ankle. PSYCH: alert & oriented x 3 with fluent speech NEURO: no focal motor/sensory deficits  LABORATORY DATA:  I have reviewed the data as listed CBC Latest Ref Rng 12/22/2014 12/15/2014 12/08/2014  WBC 3.9 - 10.3 10e3/uL 3.9 4.2 6.5  Hemoglobin 11.6 - 15.9 g/dL 11.9 11.8 12.8  Hematocrit 34.8 - 46.6 % 37.4 36.3 38.8  Platelets 145 - 400 10e3/uL 100(L) 159 131(L)    CMP Latest Ref Rng 12/22/2014 12/15/2014 12/08/2014  Glucose 70 - 140 mg/dl 401(H) 248(H) 301(H)  BUN 7.0 - 26.0 mg/dL 13.3 12.4 14.2  Creatinine 0.6 - 1.1 mg/dL 0.9 0.8 0.9  Sodium 136 - 145 mEq/L 137 138 137  Potassium 3.5 - 5.1 mEq/L 3.8 3.9 3.7  Chloride 101 - 111 mmol/L - - -  CO2 22 - 29 mEq/L 29 27 26    Calcium 8.4 - 10.4 mg/dL 8.7 9.1 9.7  Total Protein 6.4 - 8.3 g/dL 6.0(L) 5.9(L) 6.4  Total Bilirubin 0.20 - 1.20 mg/dL 0.44 0.52 0.60  Alkaline Phos 40 - 150 U/L 231(H) 235(H) 300(H)  AST 5 - 34 U/L 73(H) 90(H) 78(H)  ALT 0 - 55 U/L 105(H) 119(H) 109(H)    PATHOLOGY REPORT: Diagnosis 08/17/2014 Liver, needle/core biopsy, left - ADENOCARCINOMA.  Microscopic Comment The morphologic features are compatible with metastatic pancreatic adenocarcinoma.  RADIOGRAPHIC STUDIES: I have personally reviewed the radiological images as listed and agreed with the findings in the report.  Ct Abdomen Pelvis W Wo Contrast 08/10/2014    IMPRESSION: 1. 5.4 x 3.2 cm pancreatic body mass consistent with pancreatic adenocarcinoma. There is adjacent lymphadenopathy and diffuse hepatic metastatic disease. The portal vein is patent. 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 2. Very geographic fatty infiltration of the liver. 3. Left-sided biliary dilatation. There is likely meta tumor compressing the left hepatic duct. 4. Uterine fibroids.   Electronically Signed   By: Marijo Sanes M.D.   On: 08/10/2014 14:14      CT chet 08/31/2014 IMPRESSION: No findings specific for metastatic disease in the chest.  3 mm right lower lobe pulmonary nodule favors a benign subpleural lymph node. Attention on follow-up is suggested.  3.1 x 4.6 cm mass in the pancreatic body, corresponding to known pancreatic cancer.  Multifocal hepatic metastases, incompletely visualized.   ASSESSMENT & PLAN:  64 year old Caucasian female, with past medical history of diabetes, hypertension, IBS, arthritis, who was found to have worsening thrombocytopenia, and a newly diagnosed metastatic rectal cancer.  1. Pancreatic cancer with liver metastasis -I reviewed her initial CT chest, abdomen and pelvis scan findings in the liver mass biopsy results in great detail. The image was reviewed in person. -I  discussed the incurable nature of metastatic pancreas cancer and overall poor prognosis. However I am little concerned about how much she really grasp about this. She does have very positive  Attitude  -I recommend systemic chemotherapy. The goal of treatment is palliation and to prolong her life. -I discussed the option of single agent gemcitabine, gemcitabine with Abraxane, FOLFOX or FOLFIRINOX -Due to her significant thrombocytopenia, I think gemcitabine-based regimen would be difficult to tolerate. I recommend a FOLFOX as first line therapy, unfortunately she did not response well  -She is currently on second line gemcitabine and Abraxane, due to disease progression -lab reviewed, mild  Thrombocytopenia, adequate for treatment,  We'll proceed with cycle 2 date 15 Abraxane and gemcitabine.  She is off chemotherapy next week - she is scheduled for restaging CT scan next week  2. Thrombocytopenia -This appears to be chronic, she has no history of liver disease, CT scan did not reveal liver cirrhosis or spleenmagely. -This is possible ITP, but she did not respond well to a trial of prednisone -As a bone marrow disease is also possible. Giving the overall poor prognosis from a metastatic pancreas cancer, I did not recommend a bone marrow biopsy at this point. -Her platelet count recovered to normal after first dose of gemcitabine and Abraxane. -We'll continue monitoring  3. RLE DVT -on coumadin now  -follow up with our coumadin clinic   4. Elevated liver enzyme - this is likely secondary to her diffuse liver metastasis  - we'll be cautious when she is on Abraxane, I'll further reduce her dose of Abraxane due to her tolerance issue  - close monitoring   5. Diabetes, hypertension, IBS, anxiety -She will continue follow-up with her primary care physician -Close monitoring during chemotherapy  6. Social support -She lives alone, her ex-husband is supportive. She knows to call for help if  needed  Plan -C2D15  Abraxane and gemcitabine today with reduced Abraxane 80m/m2 and gem 6027mm2   - CT scan next week - I'll see her back in 2 weeks before cycle  3 - she wants to be off chemotherapy for the week of August 22 due to her vacation plan   All questions were answered. The patient knows to call the clinic with any problems, questions or concerns. I spent 15 minutes counseling the patient face to face. The total time spent in the appointment was 20 minutes and more than 50% was on counseling.      Truitt Merle, MD 12/22/2014 2:13 PM

## 2014-12-22 NOTE — Patient Instructions (Signed)
[facility] Discharge Instructions for Patients Receiving Chemotherapy  Today you received the following chemotherapy agents Abraxane/Gemzar  To help prevent nausea and vomiting after your treatment, we encourage you to take your nausea medication    If you develop nausea and vomiting that is not controlled by your nausea medication, call the clinic.   BELOW ARE SYMPTOMS THAT SHOULD BE REPORTED IMMEDIATELY:  *FEVER GREATER THAN 100.5 F  *CHILLS WITH OR WITHOUT FEVER  NAUSEA AND VOMITING THAT IS NOT CONTROLLED WITH YOUR NAUSEA MEDICATION  *UNUSUAL SHORTNESS OF BREATH  *UNUSUAL BRUISING OR BLEEDING  TENDERNESS IN MOUTH AND THROAT WITH OR WITHOUT PRESENCE OF ULCERS  *URINARY PROBLEMS  *BOWEL PROBLEMS  UNUSUAL RASH Items with * indicate a potential emergency and should be followed up as soon as possible.  Feel free to call the clinic you have any questions or concerns. The clinic phone number is [phone].  Please show the CHEMO ALERT CARD at check-in to the Emergency Department and triage nurse.   

## 2014-12-24 ENCOUNTER — Encounter: Payer: Self-pay | Admitting: *Deleted

## 2014-12-24 NOTE — Progress Notes (Signed)
[place] Work  Clinical Social Work was referred by [employer], Therapist, sports.  RN left voicemail for CSW stating patient has requested to add to healthcare living will that she would not want to live in a facility and also had questions about receiving a gas card.Clinical Social Worker left voicemail requesting patient return call to offer support and assess for needs.     Polo Riley, MSW, LCSW, OSW-C Clinical Social Worker [facility] [phone]

## 2014-12-27 ENCOUNTER — Ambulatory Visit (HOSPITAL_COMMUNITY)
Admission: RE | Admit: 2014-12-27 | Discharge: 2014-12-27 | Disposition: A | Discharge status: Home / Self Care | Payer: PPO | Source: Ambulatory Visit | Attending: Hematology | Admitting: Hematology

## 2014-12-27 DIAGNOSIS — C259 Malignant neoplasm of pancreas, unspecified: Secondary | ICD-10-CM | POA: Insufficient documentation

## 2014-12-27 DIAGNOSIS — C787 Secondary malignant neoplasm of liver and intrahepatic bile duct: Secondary | ICD-10-CM | POA: Diagnosis not present

## 2014-12-27 DIAGNOSIS — M898X8 Other specified disorders of bone, other site: Secondary | ICD-10-CM | POA: Insufficient documentation

## 2014-12-27 MED ORDER — IOHEXOL 300 MG/ML  SOLN
100.0000 mL | Freq: Once | INTRAMUSCULAR | Status: AC | PRN
  Administered 2014-12-27: 100 mL via INTRAVENOUS

## 2014-12-28 ENCOUNTER — Encounter: Payer: Self-pay | Admitting: Hematology

## 2014-12-28 NOTE — Progress Notes (Signed)
Spoke w/ pt regarding her $200 copay for scans.  I advised that she call hospital billing to discuss any payment arrangements.  She informed me that she wasn't asked to pay anything yesterday when she arrived for her visit.

## 2014-12-29 ENCOUNTER — Telehealth: Payer: Self-pay | Admitting: *Deleted

## 2014-12-29 NOTE — Telephone Encounter (Signed)
Per [facility] to resume fish oil 1000 mg/day. INR has been borderline therapeutic. May increase bruising. [place] notified.

## 2014-12-29 NOTE — Telephone Encounter (Signed)
Reports that a physician at [facility] told her not to take Fish Oil. Was previously on 1000 mg /day. Asking if she can resume this, and if not, why? Being followed by coumadin clinic for bilateral DVT. Forwarded message to coumadin clinic.

## 2014-12-31 ENCOUNTER — Telehealth: Payer: Self-pay | Admitting: Hematology

## 2014-12-31 NOTE — Telephone Encounter (Signed)
Due to [facility] schedule moved 8/17 lab/YF to 8/16 - tx will remain 8/17. Spoke with patient she is aware and has confirmed both appointments for 8/16 @ 9:30 am and 8/17 @ 10:45 am.

## 2015-01-04 ENCOUNTER — Other Ambulatory Visit (HOSPITAL_BASED_OUTPATIENT_CLINIC_OR_DEPARTMENT_OTHER): Payer: PPO

## 2015-01-04 ENCOUNTER — Telehealth: Payer: Self-pay | Admitting: Hematology

## 2015-01-04 ENCOUNTER — Encounter: Payer: Self-pay | Admitting: *Deleted

## 2015-01-04 ENCOUNTER — Ambulatory Visit (HOSPITAL_BASED_OUTPATIENT_CLINIC_OR_DEPARTMENT_OTHER): Payer: PPO | Admitting: Hematology

## 2015-01-04 ENCOUNTER — Telehealth: Payer: Self-pay | Admitting: *Deleted

## 2015-01-04 VITALS — BP 168/93 | HR 84 | Temp 97.0°F | Resp 20 | Ht 64.5 in | Wt 224.9 lb

## 2015-01-04 DIAGNOSIS — R748 Abnormal levels of other serum enzymes: Secondary | ICD-10-CM | POA: Diagnosis not present

## 2015-01-04 DIAGNOSIS — Z86718 Personal history of other venous thrombosis and embolism: Secondary | ICD-10-CM

## 2015-01-04 DIAGNOSIS — C251 Malignant neoplasm of body of pancreas: Secondary | ICD-10-CM

## 2015-01-04 DIAGNOSIS — C259 Malignant neoplasm of pancreas, unspecified: Secondary | ICD-10-CM

## 2015-01-04 DIAGNOSIS — I82401 Acute embolism and thrombosis of unspecified deep veins of right lower extremity: Secondary | ICD-10-CM

## 2015-01-04 DIAGNOSIS — D696 Thrombocytopenia, unspecified: Secondary | ICD-10-CM | POA: Diagnosis not present

## 2015-01-04 DIAGNOSIS — C787 Secondary malignant neoplasm of liver and intrahepatic bile duct: Secondary | ICD-10-CM | POA: Diagnosis not present

## 2015-01-04 LAB — CBC WITH DIFFERENTIAL/PLATELET
BASO%: 1.2 % (ref 0.0–2.0)
Basophils Absolute: 0.1 10*3/uL (ref 0.0–0.1)
EOS%: 3.4 % (ref 0.0–7.0)
Eosinophils Absolute: 0.2 10*3/uL (ref 0.0–0.5)
HEMATOCRIT: 38.5 % (ref 34.8–46.6)
HGB: 12.4 g/dL (ref 11.6–15.9)
LYMPH#: 1.1 10*3/uL (ref 0.9–3.3)
LYMPH%: 22.3 % (ref 14.0–49.7)
MCH: 28.5 pg (ref 25.1–34.0)
MCHC: 32.3 g/dL (ref 31.5–36.0)
MCV: 88.3 fL (ref 79.5–101.0)
MONO#: 0.6 10*3/uL (ref 0.1–0.9)
MONO%: 12.5 % (ref 0.0–14.0)
NEUT%: 60.6 % (ref 38.4–76.8)
NEUTROS ABS: 3.1 10*3/uL (ref 1.5–6.5)
PLATELETS: 115 10*3/uL — AB (ref 145–400)
RBC: 4.36 10*6/uL (ref 3.70–5.45)
RDW: 16.6 % — ABNORMAL HIGH (ref 11.2–14.5)
WBC: 5.1 10*3/uL (ref 3.9–10.3)

## 2015-01-04 LAB — COMPREHENSIVE METABOLIC PANEL (CC13)
ALBUMIN: 3.3 g/dL — AB (ref 3.5–5.0)
ALK PHOS: 220 U/L — AB (ref 40–150)
ALT: 72 U/L — ABNORMAL HIGH (ref 0–55)
AST: 56 U/L — ABNORMAL HIGH (ref 5–34)
Anion Gap: 7 mEq/L (ref 3–11)
BUN: 11.4 mg/dL (ref 7.0–26.0)
CALCIUM: 8.8 mg/dL (ref 8.4–10.4)
CO2: 26 mEq/L (ref 22–29)
Chloride: 105 mEq/L (ref 98–109)
Creatinine: 0.9 mg/dL (ref 0.6–1.1)
EGFR: 71 mL/min/{1.73_m2} — ABNORMAL LOW (ref >90)
Glucose: 301 mg/dl — ABNORMAL HIGH (ref 70–140)
POTASSIUM: 3.7 meq/L (ref 3.5–5.1)
SODIUM: 138 meq/L (ref 136–145)
Total Bilirubin: 0.55 mg/dL (ref 0.20–1.20)
Total Protein: 5.9 g/dL — ABNORMAL LOW (ref 6.4–8.3)

## 2015-01-04 LAB — PROTIME-INR
INR: 2.5 (ref 2.00–3.50)
Protime: 30 Seconds — ABNORMAL HIGH (ref 10.6–13.4)

## 2015-01-04 MED ORDER — WARFARIN SODIUM 7.5 MG PO TABS: ORAL_TABLET | ORAL | Status: DC

## 2015-01-04 NOTE — Progress Notes (Signed)
[place] Work  Holiday representative met with patient in office at [facility] to offer support and assess for needs.  Patient expresses increased stress and anxiety due to medical bills and prescription drug cost.  CSW and patient reviewed her bills and medicaid application.  CSw also contacted patients DSS caseworker to determine information needed.  CSW and patient organized medicaid packet and prepared for her to mail to DSS.  CSW also assisted patient in collecting information to send to her son.  Patient also requested to change her address and HIPPA release of information.  CSW walked patient to registration and changes were mad.  Patient stated she felt much better and would call CSW with any additional questions or concerns.   Johnnye Lana, MSW, LCSW, OSW-C Clinical Social Worker [facility] [phone]

## 2015-01-04 NOTE — Telephone Encounter (Signed)
Patient called from [place] on [facility] Dr. Kinnie Feil refill for warfarin.  This nurse called refill order from today which printed to pharmacist in [place].

## 2015-01-04 NOTE — Telephone Encounter (Signed)
Gave avs & calendar for August °

## 2015-01-04 NOTE — Progress Notes (Signed)
Amber Bishop  Telephone:([phone]) [phone] Fax:([phone]) [place] Note   Patient Care Team: Amber Stains, MD as PCP - General (Family Medicine) Amber Merle, MD as Consulting Physician (Hematology and Oncology) Amber Rend, MD as Consulting Physician (Endocrinology) Amber Ade, RN as Registered Nurse (Oncology) Amber Bishop, [facility] as [facility] (Pharmacist) 01/04/2015  CHIEF COMPLAINTS:  Follow up thrombocytopenia and metastatic pancreatic cancer  Oncology History   Pancreatic cancer metastasized to liver   Staging form: Pancreas, AJCC 7th Edition     Clinical: Stage IV (T2, NX, M1) - Unsigned       Pancreatic cancer metastasized to liver   08/10/2014 Imaging CT abdomen showed 5.4 x 3.2 cm pancreatic body mass consistent with pancreatic adenocarcinoma. There is adjacent lymphadenopathy and diffuse  hepatic metastatic disease.   08/17/2014 Pathology Results Liver biopsy showed adenocarcinoma, consistent with pancreatic primary.   08/17/2014 Initial Diagnosis Pancreatic cancer metastasized to liver   08/24/2014 Tumor Marker CA19.9 >140000   08/31/2014 Imaging CT chest was negative for   09/02/2014 - 10/14/2014 Chemotherapy mFOLFOX, 4 cycles    10/26/2014 Progression CT chest, abdomen and pelvis showed probable disease progression in the liver and a stable primary pancreatic body mass.   11/02/2014 -  Chemotherapy Second line chemotherapy with gemcitabine and Abraxane   11/06/2014 - 11/15/2014 Hospital Admission She was admitted for right lower extremity DVT and cellulitis. She was discharged home with Coumadin and Lovenox bridging.    HISTORY OF PRESENTING ILLNESS:  Amber Bishop 64 y.o. female was referred by her primary care physician Dr. Dema Bishop for thrombocytopenia. She also was recently diagnosed with metastatic pancreatic cancer.   She has had a chronic thrombocytopenia since 2013. Her plt count has been in the range of 100-150,  but dropped to the range of 80s lately. She denies any signs of bleeding. No history of liver disease.  She has been diabetic for 24 years, and her blood glucose has been out of control in the past one year. She also reports fatigue in the past one year. She denies any significant abdominal pain, jaundice, bloating, or change of her bowel habits. Her appetite has been normal. No nausea or vomiting. She is able to do all selfcare, but she sleeps more than usually during the day. No abdominal pain or bloating. She was found to have abnormal LFTs on routine lab work, and her PCP Dr. Dema Bishop, who ordered and abdominal US which lead to a CT scan, which showed a 5.4 x 3.4 cm pancreatic body mass, adjacent lymphadenopathy, and diffuse hepatic metastasis. She underwent ultrasound-guided liver biopsy on 08/17/2014, and the biopsy showed adenocarcinoma consistent with pancreatic primary.   INTERIM HISTORY: Amber Bishop returns for follow-up and discuss restaging CT scan. She is doing very well, denies any significant pain, abdominal bloating, or other symptoms. She has good appetite and eating well. No fever, chill, bleeding. She is planned to go to the beach for vacation next week.  MEDICAL HISTORY:  Past Medical History  Diagnosis Date  . Anxiety   . Hypertension   . IBS (irritable bowel syndrome)   . Hyperplastic colon polyp   . Allergy   . Anemia   . Arthritis   . Diverticulosis   . Dilated aortic root     seen on prior echo but echo 05/2013 showed normal dimensions  . Bicuspid aortic valve   . LVE (left ventricular enlargement)     mild by echo 1/15 with  EF 50-55%  . pancreatic ca w/ liver mets dx'd 07/2014  . Diabetes mellitus without complication     SURGICAL HISTORY: Past Surgical History  Procedure Laterality Date  . Tubal ligation      age 49  . Colonoscopy    . Polypectomy    . Tooth extraction      3 teeth  . Belpharoptosis repair      SOCIAL HISTORY: History   Social History  .  Marital Status: Single    Spouse Name: N/A  . Number of Children: 1 son   . Years of Education: N/A   Occupational History  . 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 Smoking status: Never Smoker   . Smokeless tobacco: Never Used  . Alcohol Use: No  . Drug Use: No  . Sexual Activity: Not on file   Other Topics Concern  . Not on file   Social History Narrative    FAMILY HISTORY: Family History  Problem Relation Age of Onset  . Colon cancer Neg Hx   . Esophageal cancer Neg Hx   . Stomach cancer Neg Hx   . Asthma Mother     ALLERGIES:  is allergic to naprosyn and metformin and related.  MEDICATIONS:  Current Outpatient Prescriptions  Medication Sig Dispense Refill  . ALPRAZolam (XANAX) 0.5 MG tablet TAKE 1 TABLET THREE TIMES DAILY IF NEEDED FOR ANXIETY 90 tablet 3  . amLODipine (NORVASC) 5 MG tablet Take 5 mg by mouth every morning.     . blood glucose meter kit and supplies KIT Dispense based on patient and insurance preference. Use up to four times daily as directed. Dx code: E11.65 1 each 0  . Calcium Carbonate-Vitamin D (CALCIUM 600/VITAMIN D) 600-400 MG-UNIT per tablet Take 1 tablet by mouth 2 (two) times daily.     . cetirizine (ZYRTEC) 10 MG tablet Take 10 mg by mouth daily.    . cholestyramine (QUESTRAN) 4 G packet Take 1/2-1 packet dissolved in water/juice once daily....................Marland KitchenPRN (Patient taking differently: Take 2-4 g by mouth daily as needed (IBS). ) 90 each 3  . dextromethorphan (DELSYM) 30 MG/5ML liquid Take 15 mg by mouth 2 (two) times daily as needed for cough.    . diphenhydrAMINE-zinc acetate (BENADRYL) cream Apply topically 2 (two) times daily as needed for itching. 28.4 g 0  . FLUoxetine (PROZAC) 20 MG capsule Take 20 mg by mouth every morning.     Marland Kitchen glucose blood (ACCU-CHEK AVIVA PLUS) test strip Test 4 times daily as instructed. 125 each 3  . insulin aspart (NOVOLOG FLEXPEN) 100 UNIT/ML FlexPen Inject 14-18 units into the skin 3  times daily as instructed. (Patient taking differently: Inject 20-28 Units into the skin 3 (three) times daily with meals. ) 15 mL 2  . insulin glargine (LANTUS) 100 UNIT/ML injection Inject 74 Units into the skin at bedtime.     . Insulin Pen Needle (VALUMARK PEN NEEDLES) 31G X 8 MM MISC Use to inject insulin 4 times daily as instructed. 150 each 3  . LANCETS ULTRA THIN 30G MISC Use to test blood sugar 4 times daily as instructed. Dx code: E11.65 200 each 2  . lidocaine-prilocaine (EMLA) cream Apply 1 application topically as needed. 30 g 3  . Multiple Vitamins-Minerals (CENTRUM SILVER PO) Take 1 capsule by mouth every morning.     . Omega-3 Fatty Acids (FISH OIL) 1000 MG CAPS Take 1 capsule by mouth every morning.     Marland Kitchen  OVER THE COUNTER MEDICATION Apply 1 drop topically 3 (three) times daily. Essential Oils.    . valsartan-hydrochlorothiazide (DIOVAN-HCT) 320-12.5 MG per tablet Take 1 tablet by mouth every morning.     . warfarin (COUMADIN) 7.5 MG tablet Take 1 tablet (7.5 mg total) by mouth daily. 30 tablet 1   No current facility-administered medications for this visit.   Facility-Administered Medications Ordered in Other Visits  Medication Dose Route Frequency Provider Last Rate Last Dose  . sodium chloride 0.9 % injection 10 mL  10 mL Intracatheter PRN Amber Merle, MD   10 mL at 12/15/14 1701    REVIEW OF SYSTEMS:   Constitutional: Denies fevers, chills or abnormal night sweats Eyes: Denies blurriness of vision, double vision or watery eyes Ears, nose, mouth, throat, and face: Denies mucositis or sore throat Respiratory: Denies cough, dyspnea or wheezes Cardiovascular: Denies palpitation, chest discomfort or lower extremity swelling Gastrointestinal:  Denies nausea, heartburn or change in bowel habits Skin: Denies abnormal skin rashes Lymphatics: Denies new lymphadenopathy or easy bruising Neurological:Denies numbness, tingling or new weaknesses Behavioral/Psych: Mood is stable, no new  changes  All other systems were reviewed with the patient and are negative.   PHYSICAL EXAMINATION: ECOG PERFORMANCE STATUS: 1 - Symptomatic but completely ambulatory  Filed Vitals:   01/04/15 1052  BP: 168/93  Pulse: 84  Temp: 97 F (36.1 C)  Resp: 20   Filed Weights   01/04/15 1052  Weight: 224 lb 14.4 oz (102.014 kg)    GENERAL:alert, no distress and comfortable SKIN: skin color, texture, turgor are normal, no rashes or significant lesions except (+) mild skin pigmentation in the right lower extremity EYES: normal, conjunctiva are pink and non-injected, sclera clear OROPHARYNX:no exudate, no erythema and lips, buccal mucosa, and tongue normal  NECK: supple, thyroid normal size, non-tender, without nodularity LYMPH:  no palpable lymphadenopathy in the cervical, axillary or inguinal LUNGS: clear to auscultation and percussion with normal breathing effort HEART: regular rate & rhythm and no murmurs and no lower extremity edema ABDOMEN:abdomen soft, non-tender and normal bowel sounds Musculoskeletal:no cyanosis of digits and no clubbing. (+) Mild pitting edema in the right lower extremity above the ankle. PSYCH: alert & oriented x 3 with fluent speech NEURO: no focal motor/sensory deficits  LABORATORY DATA:  I have reviewed the data as listed CBC Latest Ref Rng 01/04/2015 12/22/2014 12/15/2014  WBC 3.9 - 10.3 10e3/uL 5.1 3.9 4.2  Hemoglobin 11.6 - 15.9 g/dL 12.4 11.9 11.8  Hematocrit 34.8 - 46.6 % 38.5 37.4 36.3  Platelets 145 - 400 10e3/uL 115(L) 100(L) 159    CMP Latest Ref Rng 01/04/2015 12/22/2014 12/15/2014  Glucose 70 - 140 mg/dl 301(H) 401(H) 248(H)  BUN 7.0 - 26.0 mg/dL 11.4 13.3 12.4  Creatinine 0.6 - 1.1 mg/dL 0.9 0.9 0.8  Sodium 136 - 145 mEq/L 138 137 138  Potassium 3.5 - 5.1 mEq/L 3.7 3.8 3.9  Chloride 101 - 111 mmol/L - - -  CO2 22 - 29 mEq/L _0 Calcium 8.4 - 10.4 mg/dL 8.8 8.7 9.1  Total Protein 6.4 - 8.3 g/dL 5.9(L) 6.0(L) 5.9(L)  Total Bilirubin 0.20  - 1.20 mg/dL 0.55 0.44 0.52  Alkaline Phos 40 - 150 U/L 220(H) 231(H) 235(H)  AST 5 - 34 U/L 56(H) 73(H) 90(H)  ALT 0 - 55 U/L 72(H) 105(H) 119(H)    PATHOLOGY REPORT: Diagnosis 08/17/2014 Liver, needle/core biopsy, left - ADENOCARCINOMA. Microscopic Comment The morphologic features are compatible with metastatic pancreatic adenocarcinoma.  RADIOGRAPHIC STUDIES:  I have personally reviewed the radiological images as listed and agreed with the findings in the report.  CT chest, abdomen and pelvis with contrast 12/27/2014 IMPRESSION: 1. No evidence for metastatic disease in the thorax. 2. Mild circumferential wall thickening in the distal esophagus, stable. This may be related to esophagitis. 3. No substantial change in hypo enhancing lesion in the body the pancreas. 4. Multiple hepatic metastases show no substantial interval change. Metastatic involvement of the liver is seen on a background of geographic steatosis and left intrahepatic biliary duct dilatation is stable. 5. 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 6. There is a new small area of sclerosis in the left aspect of the L1 vertebral body. Attention to this region on followup is recommended as bony metastatic disease is a concern.   ASSESSMENT & PLAN:  64 year old Caucasian female, with past medical history of diabetes, hypertension, IBS, arthritis, who was found to have worsening thrombocytopenia, and a newly diagnosed metastatic rectal cancer.  1. Pancreatic cancer with liver metastasis -I reviewed her initial CT chest, abdomen and pelvis scan findings in the liver mass biopsy results in great detail. The image was reviewed in person. -I discussed the incurable nature of metastatic pancreas cancer and overall poor prognosis. However I am little concerned about how much she really grasp about this. She does have very positive  Attitude  -I recommend systemic  chemotherapy. The goal of treatment is palliation and to prolong her life. -I discussed the option of single agent gemcitabine, gemcitabine with Abraxane, FOLFOX or FOLFIRINOX -Due to her significant thrombocytopenia, I think gemcitabine-based regimen would be difficult to tolerate. I recommend a FOLFOX as first line therapy, unfortunately she did not response well  -She is currently on second line gemcitabine and Abraxane, due to disease progression -I reviewed her restaging CT scan, which showed stable disease overall. No new lesions. -She is tolerating the report does reduced gemcitabine and Abraxane well, we'll proceed cycle 3 today. Due to her medication plan, we will postpone her day 8 treatment today 15.  2. Thrombocytopenia -This appears to be chronic, she has no history of liver disease, CT scan did not reveal liver cirrhosis or spleenmagely. -This is possible ITP, but she did not respond well to a trial of prednisone -As a bone marrow disease is also possible. Giving the overall poor prognosis from a metastatic pancreas cancer, I did not recommend a bone marrow biopsy at this point. -Her platelet count recovered to normal after first dose of gemcitabine and Abraxane. -We'll continue monitoring  3. RLE DVT -on coumadin now  -follow up with our coumadin clinic   4. Elevated liver enzyme - this is likely secondary to her diffuse liver metastasis  - we'll be cautious when she is on Abraxane, I'll further reduce her dose of Abraxane due to her tolerance issue  - close monitoring   5. Diabetes, hypertension, IBS, anxiety -She will continue follow-up with her primary care physician -Close monitoring during chemotherapy  6. Social support -She lives alone, her ex-husband is supportive. She knows to call for help if needed  Plan -C3D1  Abraxane and gemcitabine today with reduced Abraxane 8m/m2 and gem 6074mm2   -Postpone her day 8 treatment today 15 -I'll see her back in 2  weeks  All questions were answered. The patient knows to call the clinic with any problems, questions or concerns. I spent 25 minutes counseling the patient face to face. The total time spent  in the appointment was 30 minutes and more than 50% was on counseling.      Amber Merle, MD 01/04/2015 8:23 AM

## 2015-01-05 ENCOUNTER — Ambulatory Visit (HOSPITAL_BASED_OUTPATIENT_CLINIC_OR_DEPARTMENT_OTHER): Payer: PPO | Admitting: Pharmacist

## 2015-01-05 ENCOUNTER — Ambulatory Visit (HOSPITAL_BASED_OUTPATIENT_CLINIC_OR_DEPARTMENT_OTHER): Payer: PPO

## 2015-01-05 ENCOUNTER — Other Ambulatory Visit: Payer: PPO

## 2015-01-05 ENCOUNTER — Ambulatory Visit: Payer: PPO | Admitting: Hematology

## 2015-01-05 VITALS — BP 139/80 | HR 79 | Temp 98.7°F | Resp 20

## 2015-01-05 DIAGNOSIS — Z86718 Personal history of other venous thrombosis and embolism: Secondary | ICD-10-CM

## 2015-01-05 DIAGNOSIS — C251 Malignant neoplasm of body of pancreas: Secondary | ICD-10-CM

## 2015-01-05 DIAGNOSIS — C259 Malignant neoplasm of pancreas, unspecified: Secondary | ICD-10-CM

## 2015-01-05 DIAGNOSIS — C787 Secondary malignant neoplasm of liver and intrahepatic bile duct: Secondary | ICD-10-CM

## 2015-01-05 DIAGNOSIS — Z5111 Encounter for antineoplastic chemotherapy: Secondary | ICD-10-CM | POA: Diagnosis not present

## 2015-01-05 LAB — POCT INR: INR: 2.5

## 2015-01-05 MED ORDER — SODIUM CHLORIDE 0.9 % IJ SOLN
10.0000 mL | INTRAMUSCULAR | Status: DC | PRN
  Administered 2015-01-05: 10 mL
  Filled 2015-01-05: qty 10

## 2015-01-05 MED ORDER — SODIUM CHLORIDE 0.9 % IV SOLN
600.0000 mg/m2 | Freq: Once | INTRAVENOUS | Status: AC
  Administered 2015-01-05: 1254 mg via INTRAVENOUS
  Filled 2015-01-05: qty 33

## 2015-01-05 MED ORDER — HEPARIN SOD (PORK) LOCK FLUSH 100 UNIT/ML IV SOLN
500.0000 [IU] | Freq: Once | INTRAVENOUS | Status: AC | PRN
  Administered 2015-01-05: 500 [IU]
  Filled 2015-01-05: qty 5

## 2015-01-05 MED ORDER — PACLITAXEL PROTEIN-BOUND CHEMO INJECTION 100 MG
40.0000 mg/m2 | Freq: Once | INTRAVENOUS | Status: AC
  Administered 2015-01-05: 75 mg via INTRAVENOUS
  Filled 2015-01-05: qty 15

## 2015-01-05 MED ORDER — SODIUM CHLORIDE 0.9 % IV SOLN
Freq: Once | INTRAVENOUS | Status: AC
  Administered 2015-01-05: 13:00:00 via INTRAVENOUS
  Filled 2015-01-05: qty 4

## 2015-01-05 MED ORDER — SODIUM CHLORIDE 0.9 % IV SOLN
Freq: Once | INTRAVENOUS | Status: AC
  Administered 2015-01-05: 13:00:00 via INTRAVENOUS

## 2015-01-05 NOTE — Progress Notes (Signed)
INR at goal today at 2.5 Pt is doing well with no complaints No missed doses or extra doses No diet or medication changes Pt has bruise on left side of stomach. This is healing Pt states she is feeling great and feels that the chemo is working Plan: No changes Continue couamdin 11.25 mg (1.5 tabs) on Wed and Sat and take 7.5mg  (1 tab) the other days.  Return to Coumadin Clinic on 01/19/15: Lab at 12:30 pm, MD at 1pmpm and chemotherapy 1:45pm-we will see you in infusion

## 2015-01-05 NOTE — Patient Instructions (Signed)
[place] Discharge Instructions for Patients Receiving Chemotherapy  Today you received the following chemotherapy agents abraxane/gemzar  To help prevent nausea and vomiting after your treatment, we encourage you to take your nausea medication.   If you develop nausea and vomiting that is not controlled by your nausea medication, call the clinic.   BELOW ARE SYMPTOMS THAT SHOULD BE REPORTED IMMEDIATELY:  *FEVER GREATER THAN 100.5 F  *CHILLS WITH OR WITHOUT FEVER  NAUSEA AND VOMITING THAT IS NOT CONTROLLED WITH YOUR NAUSEA MEDICATION  *UNUSUAL SHORTNESS OF BREATH  *UNUSUAL BRUISING OR BLEEDING  TENDERNESS IN MOUTH AND THROAT WITH OR WITHOUT PRESENCE OF ULCERS  *URINARY PROBLEMS  *BOWEL PROBLEMS  UNUSUAL RASH Items with * indicate a potential emergency and should be followed up as soon as possible.  Feel free to call the clinic you have any questions or concerns. The clinic phone number is [phone].  Please show the [place] at check-in to the Emergency Department and triage nurse.

## 2015-01-05 NOTE — Patient Instructions (Signed)
INR at goal No changes Continue couamdin 11.25 mg (1.5 tabs) on Wed and Sat and take 7.5mg  (1 tab) the other days.  Return to Coumadin Clinic on 01/19/15: Lab at 12:30 pm, MD at 1pmpm and chemotherapy 1:45pm-we will see you in infusion

## 2015-01-06 ENCOUNTER — Ambulatory Visit
Admission: RE | Admit: 2015-01-06 | Discharge: 2015-01-06 | Disposition: A | Discharge status: Home / Self Care | Payer: PPO | Source: Ambulatory Visit

## 2015-01-06 ENCOUNTER — Encounter: Payer: Self-pay | Admitting: Hematology

## 2015-01-06 DIAGNOSIS — Z1231 Encounter for screening mammogram for malignant neoplasm of breast: Secondary | ICD-10-CM

## 2015-01-11 ENCOUNTER — Telehealth: Payer: Self-pay | Admitting: *Deleted

## 2015-01-11 NOTE — Telephone Encounter (Signed)
Ray called again to follow up on the PA. Said need to transfer the script to [facility] if PA does not go through today. Explained to him that managed care will process the request as soon as possible. Unfortunate they waited till day prior to vacation to fill this. Was told by pharmacy that if she pays for it with cash it would run about $23.00. With this late notice, that may be a logical choice.

## 2015-01-11 NOTE — Telephone Encounter (Addendum)
Called pharmacy to follow up on her call. Was told she needs a prior authorization on the script Dr. Burr Medico wrote on 6/916-just presented it today to be filled. Forwarded information to managed care instead of nurse. Phone # for PA: [phone]

## 2015-01-11 NOTE — Telephone Encounter (Signed)
VM saying Amber Bishop's phone is broken. She needs authorization for her Ambien. She is going on a trip tomorrow and needs it. Was last filled by Dr. Burr Medico in June. Forwarded message to collaborative nurse.

## 2015-01-12 ENCOUNTER — Other Ambulatory Visit: Payer: PPO

## 2015-01-12 ENCOUNTER — Ambulatory Visit: Payer: PPO

## 2015-01-12 ENCOUNTER — Encounter: Payer: Self-pay | Admitting: Hematology

## 2015-01-12 NOTE — Progress Notes (Signed)
I faxed req for ambien to [facility]

## 2015-01-13 ENCOUNTER — Encounter: Payer: Self-pay | Admitting: Hematology

## 2015-01-13 NOTE — Progress Notes (Signed)
Per [facility]  Zolpidem tartrate approved 01/12/15-05/21/15. I will send to medical records

## 2015-01-19 ENCOUNTER — Encounter: Payer: Self-pay | Admitting: Hematology

## 2015-01-19 ENCOUNTER — Telehealth: Payer: Self-pay | Admitting: Hematology

## 2015-01-19 ENCOUNTER — Ambulatory Visit (HOSPITAL_BASED_OUTPATIENT_CLINIC_OR_DEPARTMENT_OTHER): Payer: PPO

## 2015-01-19 ENCOUNTER — Other Ambulatory Visit: Payer: PPO

## 2015-01-19 ENCOUNTER — Ambulatory Visit (HOSPITAL_BASED_OUTPATIENT_CLINIC_OR_DEPARTMENT_OTHER): Payer: PPO | Admitting: Pharmacist

## 2015-01-19 ENCOUNTER — Other Ambulatory Visit (HOSPITAL_BASED_OUTPATIENT_CLINIC_OR_DEPARTMENT_OTHER): Payer: PPO

## 2015-01-19 ENCOUNTER — Ambulatory Visit (HOSPITAL_BASED_OUTPATIENT_CLINIC_OR_DEPARTMENT_OTHER): Payer: PPO | Admitting: Hematology

## 2015-01-19 VITALS — BP 143/83 | HR 87 | Temp 99.6°F | Resp 19 | Ht 64.5 in | Wt 226.6 lb

## 2015-01-19 DIAGNOSIS — Z5111 Encounter for antineoplastic chemotherapy: Secondary | ICD-10-CM | POA: Diagnosis not present

## 2015-01-19 DIAGNOSIS — C787 Secondary malignant neoplasm of liver and intrahepatic bile duct: Secondary | ICD-10-CM | POA: Diagnosis not present

## 2015-01-19 DIAGNOSIS — Z86718 Personal history of other venous thrombosis and embolism: Secondary | ICD-10-CM

## 2015-01-19 DIAGNOSIS — C259 Malignant neoplasm of pancreas, unspecified: Secondary | ICD-10-CM

## 2015-01-19 DIAGNOSIS — D696 Thrombocytopenia, unspecified: Secondary | ICD-10-CM

## 2015-01-19 DIAGNOSIS — R748 Abnormal levels of other serum enzymes: Secondary | ICD-10-CM | POA: Diagnosis not present

## 2015-01-19 DIAGNOSIS — C251 Malignant neoplasm of body of pancreas: Secondary | ICD-10-CM

## 2015-01-19 DIAGNOSIS — I82401 Acute embolism and thrombosis of unspecified deep veins of right lower extremity: Secondary | ICD-10-CM

## 2015-01-19 LAB — CBC WITH DIFFERENTIAL/PLATELET
BASO%: 0.7 % (ref 0.0–2.0)
Basophils Absolute: 0 10*3/uL (ref 0.0–0.1)
EOS%: 3.6 % (ref 0.0–7.0)
Eosinophils Absolute: 0.2 10*3/uL (ref 0.0–0.5)
HEMATOCRIT: 38.6 % (ref 34.8–46.6)
HGB: 12.6 g/dL (ref 11.6–15.9)
LYMPH#: 1.2 10*3/uL (ref 0.9–3.3)
LYMPH%: 18.9 % (ref 14.0–49.7)
MCH: 28.9 pg (ref 25.1–34.0)
MCHC: 32.6 g/dL (ref 31.5–36.0)
MCV: 88.5 fL (ref 79.5–101.0)
MONO#: 0.6 10*3/uL (ref 0.1–0.9)
MONO%: 10 % (ref 0.0–14.0)
NEUT%: 66.8 % (ref 38.4–76.8)
NEUTROS ABS: 4.2 10*3/uL (ref 1.5–6.5)
Platelets: 92 10*3/uL — ABNORMAL LOW (ref 145–400)
RBC: 4.36 10*6/uL (ref 3.70–5.45)
RDW: 16.2 % — ABNORMAL HIGH (ref 11.2–14.5)
WBC: 6.3 10*3/uL (ref 3.9–10.3)

## 2015-01-19 LAB — COMPREHENSIVE METABOLIC PANEL (CC13)
ALBUMIN: 3.2 g/dL — AB (ref 3.5–5.0)
ALK PHOS: 256 U/L — AB (ref 40–150)
ALT: 103 U/L — ABNORMAL HIGH (ref 0–55)
ANION GAP: 8 meq/L (ref 3–11)
AST: 89 U/L — ABNORMAL HIGH (ref 5–34)
BILIRUBIN TOTAL: 0.67 mg/dL (ref 0.20–1.20)
BUN: 13 mg/dL (ref 7.0–26.0)
CALCIUM: 9.3 mg/dL (ref 8.4–10.4)
CO2: 30 mEq/L — ABNORMAL HIGH (ref 22–29)
Chloride: 103 mEq/L (ref 98–109)
Creatinine: 1 mg/dL (ref 0.6–1.1)
EGFR: 59 mL/min/{1.73_m2} — AB (ref >90)
GLUCOSE: 302 mg/dL — AB (ref 70–140)
Potassium: 4.1 mEq/L (ref 3.5–5.1)
SODIUM: 141 meq/L (ref 136–145)
TOTAL PROTEIN: 6.1 g/dL — AB (ref 6.4–8.3)

## 2015-01-19 LAB — PROTIME-INR
INR: 2.7 (ref 2.00–3.50)
Protime: 32.4 Seconds — ABNORMAL HIGH (ref 10.6–13.4)

## 2015-01-19 LAB — POCT INR: INR: 2.7

## 2015-01-19 MED ORDER — PACLITAXEL PROTEIN-BOUND CHEMO INJECTION 100 MG
35.0000 mg/m2 | Freq: Once | INTRAVENOUS | Status: AC
  Administered 2015-01-19: 75 mg via INTRAVENOUS
  Filled 2015-01-19: qty 15

## 2015-01-19 MED ORDER — HEPARIN SOD (PORK) LOCK FLUSH 100 UNIT/ML IV SOLN
500.0000 [IU] | Freq: Once | INTRAVENOUS | Status: AC | PRN
  Administered 2015-01-19: 500 [IU]
  Filled 2015-01-19: qty 5

## 2015-01-19 MED ORDER — SODIUM CHLORIDE 0.9 % IV SOLN
Freq: Once | INTRAVENOUS | Status: AC
  Administered 2015-01-19: 14:00:00 via INTRAVENOUS

## 2015-01-19 MED ORDER — SODIUM CHLORIDE 0.9 % IJ SOLN
10.0000 mL | INTRAMUSCULAR | Status: DC | PRN
  Administered 2015-01-19: 10 mL
  Filled 2015-01-19: qty 10

## 2015-01-19 MED ORDER — SODIUM CHLORIDE 0.9 % IV SOLN
600.0000 mg/m2 | Freq: Once | INTRAVENOUS | Status: AC
  Administered 2015-01-19: 1254 mg via INTRAVENOUS
  Filled 2015-01-19: qty 32.98

## 2015-01-19 MED ORDER — SODIUM CHLORIDE 0.9 % IV SOLN
Freq: Once | INTRAVENOUS | Status: AC
  Administered 2015-01-19: 15:00:00 via INTRAVENOUS
  Filled 2015-01-19: qty 4

## 2015-01-19 NOTE — Progress Notes (Signed)
OK to treat despite plt ct & liver enzymes per Dr Burr Medico.

## 2015-01-19 NOTE — Progress Notes (Signed)
INR = 2.7 on Coumadin 7.5 mg daily except 11.25 mg on Wed/Fri. No bleeding. Pt states she has a bruise on L hip area but it is healing. INR slightly out of range.  Asymptomatic.  Likely due to low albumin.  However, she has been therapeutic last 2 INR checks.  I will have her stay on current dose w/o change. Repeat INR on 9/22 when she is back for infusion. Kennith Center, Pharm.D., CPP 01/19/2015@2 :36 PM

## 2015-01-19 NOTE — Telephone Encounter (Signed)
Gave and printed appt sched and avs for pt for Sept °

## 2015-01-19 NOTE — Patient Instructions (Signed)
[facility] Discharge Instructions for Patients Receiving Chemotherapy  Today you received the following chemotherapy agents Abraxane/Gemzar.  To help prevent nausea and vomiting after your treatment, we encourage you to take your nausea medication as directed.   If you develop nausea and vomiting that is not controlled by your nausea medication, call the clinic.   BELOW ARE SYMPTOMS THAT SHOULD BE REPORTED IMMEDIATELY:  *FEVER GREATER THAN 100.5 F  *CHILLS WITH OR WITHOUT FEVER  NAUSEA AND VOMITING THAT IS NOT CONTROLLED WITH YOUR NAUSEA MEDICATION  *UNUSUAL SHORTNESS OF BREATH  *UNUSUAL BRUISING OR BLEEDING  TENDERNESS IN MOUTH AND THROAT WITH OR WITHOUT PRESENCE OF ULCERS  *URINARY PROBLEMS  *BOWEL PROBLEMS  UNUSUAL RASH Items with * indicate a potential emergency and should be followed up as soon as possible.  Feel free to call the clinic you have any questions or concerns. The clinic phone number is [phone].  Please show the CHEMO ALERT CARD at check-in to the Emergency Department and triage nurse.    

## 2015-01-19 NOTE — Progress Notes (Signed)
Amber Bishop  Telephone:([phone]) [phone] Fax:([phone]) [place] Note   Patient Care Team: Harlan Stains, MD as PCP - General (Family Medicine) Truitt Merle, MD as Consulting Physician (Hematology and Oncology) Delrae Rend, MD as Consulting Physician (Endocrinology) Tania Ade, RN as Registered Nurse (Oncology) Gaynelle Adu, [facility] as [place] Management (Pharmacist) 01/19/2015  CHIEF COMPLAINTS:  Follow up thrombocytopenia and metastatic pancreatic cancer  Oncology History   Pancreatic cancer metastasized to liver   Staging form: Pancreas, AJCC 7th Edition     Clinical: Stage IV (T2, NX, M1) - Unsigned       Pancreatic cancer metastasized to liver   08/10/2014 Imaging CT abdomen showed 5.4 x 3.2 cm pancreatic body mass consistent with pancreatic adenocarcinoma. There is adjacent lymphadenopathy and diffuse  hepatic metastatic disease.   08/17/2014 Pathology Results Liver biopsy showed adenocarcinoma, consistent with pancreatic primary.   08/17/2014 Initial Diagnosis Pancreatic cancer metastasized to liver   08/24/2014 Tumor Marker CA19.9 >140000   08/31/2014 Imaging CT chest was negative for   09/02/2014 - 10/14/2014 Chemotherapy mFOLFOX, 4 cycles    10/26/2014 Progression CT chest, abdomen and pelvis showed probable disease progression in the liver and a stable primary pancreatic body mass.   11/02/2014 -  Chemotherapy Second line chemotherapy with gemcitabine and Abraxane   11/06/2014 - 11/15/2014 Hospital Admission She was admitted for right lower extremity DVT and cellulitis. She was discharged home with Coumadin and Lovenox bridging.   12/27/2014 Imaging CT scan showed stable pancreatic mass and hepatic metastasis, no other new lesions.    HISTORY OF PRESENTING ILLNESS:  Amber Bishop 64 y.o. female was referred by her primary care physician Dr. Dema Bishop for thrombocytopenia. She also was recently diagnosed with metastatic pancreatic cancer.    She has had a chronic thrombocytopenia since 2013. Her plt count has been in the range of 100-150, but dropped to the range of 80s lately. She denies any signs of bleeding. No history of liver disease.  She has been diabetic for 24 years, and her blood glucose has been out of control in the past one year. She also reports fatigue in the past one year. She denies any significant abdominal pain, jaundice, bloating, or change of her bowel habits. Her appetite has been normal. No nausea or vomiting. She is able to do all selfcare, but she sleeps more than usually during the day. No abdominal pain or bloating. She was found to have abnormal LFTs on routine lab work, and her PCP Dr. Dema Bishop, who ordered and abdominal US which lead to a CT scan, which showed a 5.4 x 3.4 cm pancreatic body mass, adjacent lymphadenopathy, and diffuse hepatic metastasis. She underwent ultrasound-guided liver biopsy on 08/17/2014, and the biopsy showed adenocarcinoma consistent with pancreatic primary.   INTERIM HISTORY: Amber Bishop returns for follow-up. She and her ex-husband went to [facility] last week, and she really enjoyed it. She feels well overall, denies any pain, nausea, or other new symptoms. She has moderate fatigue, but able to function well at home. She denies any fever, chills, bleeding, or other new symptoms.  MEDICAL HISTORY:  Past Medical History  Diagnosis Date  . Anxiety   . Hypertension   . IBS (irritable bowel syndrome)   . Hyperplastic colon polyp   . Allergy   . Anemia   . Arthritis   . Diverticulosis   . Dilated aortic root     seen on prior echo but echo 05/2013 showed  normal dimensions  . Bicuspid aortic valve   . LVE (left ventricular enlargement)     mild by echo 1/15 with EF 50-55%  . pancreatic ca w/ liver mets dx'd 07/2014  . Diabetes mellitus without complication     SURGICAL HISTORY: Past Surgical History  Procedure Laterality Date  . Tubal ligation      age 42  . Colonoscopy    .  Polypectomy    . Tooth extraction      3 teeth  . Belpharoptosis repair      SOCIAL HISTORY: History   Social History  . Marital Status: Single    Spouse Name: N/A  . Number of Children: 1 son   . Years of Education: N/A   Occupational History  . 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 Smoking status: Never Smoker   . Smokeless tobacco: Never Used  . Alcohol Use: No  . Drug Use: No  . Sexual Activity: Not on file   Other Topics Concern  . Not on file   Social History Narrative    FAMILY HISTORY: Family History  Problem Relation Age of Onset  . Colon cancer Neg Hx   . Esophageal cancer Neg Hx   . Stomach cancer Neg Hx   . Asthma Mother     ALLERGIES:  is allergic to naprosyn and metformin and related.  MEDICATIONS:  Current Outpatient Prescriptions  Medication Sig Dispense Refill  . ALPRAZolam (XANAX) 0.5 MG tablet TAKE 1 TABLET THREE TIMES DAILY IF NEEDED FOR ANXIETY 90 tablet 3  . amLODipine (NORVASC) 5 MG tablet Take 5 mg by mouth every morning.     . blood glucose meter kit and supplies KIT Dispense based on patient and insurance preference. Use up to four times daily as directed. Dx code: E11.65 1 each 0  . Calcium Carbonate-Vitamin D (CALCIUM 600/VITAMIN D) 600-400 MG-UNIT per tablet Take 1 tablet by mouth 2 (two) times daily.     . cetirizine (ZYRTEC) 10 MG tablet Take 10 mg by mouth daily.    . cholestyramine (QUESTRAN) 4 G packet Take 1/2-1 packet dissolved in water/juice once daily....................Marland KitchenPRN (Patient taking differently: Take 2-4 g by mouth daily as needed (IBS). ) 90 each 3  . dextromethorphan (DELSYM) 30 MG/5ML liquid Take 15 mg by mouth 2 (two) times daily as needed for cough.    . diphenhydrAMINE-zinc acetate (BENADRYL) cream Apply topically 2 (two) times daily as needed for itching. 28.4 g 0  . FLUoxetine (PROZAC) 20 MG capsule Take 20 mg by mouth every morning.     Marland Kitchen glucose blood (ACCU-CHEK AVIVA PLUS) test strip Test  4 times daily as instructed. 125 each 3  . insulin aspart (NOVOLOG FLEXPEN) 100 UNIT/ML FlexPen Inject 14-18 units into the skin 3 times daily as instructed. (Patient taking differently: Inject 24-28 Units into the skin 3 (three) times daily with meals. ) 15 mL 2  . insulin glargine (LANTUS) 100 UNIT/ML injection Inject 74 Units into the skin at bedtime.     . Insulin Pen Needle (VALUMARK PEN NEEDLES) 31G X 8 MM MISC Use to inject insulin 4 times daily as instructed. 150 each 3  . LANCETS ULTRA THIN 30G MISC Use to test blood sugar 4 times daily as instructed. Dx code: E11.65 200 each 2  . lidocaine-prilocaine (EMLA) cream Apply 1 application topically as needed. 30 g 3  . Multiple Vitamins-Minerals (CENTRUM SILVER PO) Take 1 capsule by mouth every  morning.     . Omega-3 Fatty Acids (FISH OIL) 1000 MG CAPS Take 1 capsule by mouth every morning.     Marland Kitchen OVER THE COUNTER MEDICATION Apply 1 drop topically 3 (three) times daily. Essential Oils.    . valsartan-hydrochlorothiazide (DIOVAN-HCT) 320-12.5 MG per tablet Take 1 tablet by mouth every morning.     . warfarin (COUMADIN) 7.5 MG tablet One tab daily except 1.5 tab on Wednesday and Friday 40 tablet 1   No current facility-administered medications for this visit.   Facility-Administered Medications Ordered in Other Visits  Medication Dose Route Frequency Provider Last Rate Last Dose  . sodium chloride 0.9 % injection 10 mL  10 mL Intracatheter PRN Truitt Merle, MD   10 mL at 12/15/14 1701    REVIEW OF SYSTEMS:   Constitutional: Denies fevers, chills or abnormal night sweats Eyes: Denies blurriness of vision, double vision or watery eyes Ears, nose, mouth, throat, and face: Denies mucositis or sore throat Respiratory: Denies cough, dyspnea or wheezes Cardiovascular: Denies palpitation, chest discomfort or lower extremity swelling Gastrointestinal:  Denies nausea, heartburn or change in bowel habits Skin: Denies abnormal skin rashes Lymphatics:  Denies new lymphadenopathy or easy bruising Neurological:Denies numbness, tingling or new weaknesses Behavioral/Psych: Mood is stable, no new changes  All other systems were reviewed with the patient and are negative.   PHYSICAL EXAMINATION: ECOG PERFORMANCE STATUS: 1 - Symptomatic but completely ambulatory  Filed Vitals:   01/19/15 1317  BP: 143/83  Pulse: 87  Temp: 99.6 F (37.6 C)  Resp: 19   Filed Weights   01/19/15 1317  Weight: 226 lb 9.6 oz (102.785 kg)    GENERAL:alert, no distress and comfortable SKIN: skin color, texture, turgor are normal, no rashes or significant lesions except (+) mild skin pigmentation in the right lower extremity EYES: normal, conjunctiva are pink and non-injected, sclera clear OROPHARYNX:no exudate, no erythema and lips, buccal mucosa, and tongue normal  NECK: supple, thyroid normal size, non-tender, without nodularity LYMPH:  no palpable lymphadenopathy in the cervical, axillary or inguinal LUNGS: clear to auscultation and percussion with normal breathing effort HEART: regular rate & rhythm and no murmurs and no lower extremity edema ABDOMEN:abdomen soft, non-tender and normal bowel sounds Musculoskeletal:no cyanosis of digits and no clubbing. (+) Mild pitting edema in the right lower extremity above the ankle. PSYCH: alert & oriented x 3 with fluent speech NEURO: no focal motor/sensory deficits  LABORATORY DATA:  I have reviewed the data as listed CBC Latest Ref Rng 01/19/2015 01/04/2015 12/22/2014  WBC 3.9 - 10.3 10e3/uL 6.3 5.1 3.9  Hemoglobin 11.6 - 15.9 g/dL 12.6 12.4 11.9  Hematocrit 34.8 - 46.6 % 38.6 38.5 37.4  Platelets 145 - 400 10e3/uL 92(L) 115(L) 100(L)    CMP Latest Ref Rng 01/19/2015 01/04/2015 12/22/2014  Glucose 70 - 140 mg/dl 302(H) 301(H) 401(H)  BUN 7.0 - 26.0 mg/dL 13.0 11.4 13.3  Creatinine 0.6 - 1.1 mg/dL 1.0 0.9 0.9  Sodium 136 - 145 mEq/L 141 138 137  Potassium 3.5 - 5.1 mEq/L 4.1 3.7 3.8  Chloride 101 - 111 mmol/L -  - -  CO2 22 - 29 mEq/L 30(H) 26 29  Calcium 8.4 - 10.4 mg/dL 9.3 8.8 8.7  Total Protein 6.4 - 8.3 g/dL 6.1(L) 5.9(L) 6.0(L)  Total Bilirubin 0.20 - 1.20 mg/dL 0.67 0.55 0.44  Alkaline Phos 40 - 150 U/L 256(H) 220(H) 231(H)  AST 5 - 34 U/L 89(H) 56(H) 73(H)  ALT 0 - 55 U/L 103(H) 72(H) 105(H)  PATHOLOGY REPORT: Diagnosis 08/17/2014 Liver, needle/core biopsy, left - ADENOCARCINOMA. Microscopic Comment The morphologic features are compatible with metastatic pancreatic adenocarcinoma.  RADIOGRAPHIC STUDIES: I have personally reviewed the radiological images as listed and agreed with the findings in the report.  CT chest, abdomen and pelvis with contrast 12/27/2014 IMPRESSION: 1. No evidence for metastatic disease in the thorax. 2. Mild circumferential wall thickening in the distal esophagus, stable. This may be related to esophagitis. 3. No substantial change in hypo enhancing lesion in the body the pancreas. 4. Multiple hepatic metastases show no substantial interval change. Metastatic involvement of the liver is seen on a background of geographic steatosis and left intrahepatic biliary duct dilatation is stable. 5. 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 6. There is a new small area of sclerosis in the left aspect of the L1 vertebral body. Attention to this region on followup is recommended as bony metastatic disease is a concern.   ASSESSMENT & PLAN:  64 year old Caucasian female, with past medical history of diabetes, hypertension, IBS, arthritis, who was found to have worsening thrombocytopenia, and a newly diagnosed metastatic rectal cancer.  1. Pancreatic cancer with liver metastasis -I reviewed her initial CT chest, abdomen and pelvis scan findings in the liver mass biopsy results in great detail. The image was reviewed in person. -I discussed the incurable nature of metastatic pancreas cancer and overall poor  prognosis. However I am little concerned about how much she really grasp about this. She does have very positive  Attitude  -I recommend systemic chemotherapy. The goal of treatment is palliation and to prolong her life. -I discussed the option of single agent gemcitabine, gemcitabine with Abraxane, FOLFOX or FOLFIRINOX -Due to her significant thrombocytopenia, I think gemcitabine-based regimen would be difficult to tolerate. I recommend a FOLFOX as first line therapy, unfortunately she did not response well  -She is currently on second line gemcitabine and Abraxane, due to disease progression -I reviewed her restaging CT scan, which showed stable disease overall. No new lesions. -She is tolerating does reduced gemcitabine and Abraxane well, we'll proceed cycle 3 day 8 (was postponed for one week) today.   2. Thrombocytopenia -This appears to be chronic, she has no history of liver disease, CT scan did not reveal liver cirrhosis or spleenmagely. -This is possible ITP, but she did not respond well to a trial of prednisone -As a bone marrow disease is also possible. Giving the overall poor prognosis from a metastatic pancreas cancer, I did not recommend a bone marrow biopsy at this point. -Her platelet count recovered to normal after first dose of gemcitabine and Abraxane. -We'll continue monitoring, plt 92 today   3. RLE DVT -on coumadin now  -follow up with our coumadin clinic, INR 2.7 today  -We discussed the high risk of bleeding from comadin when her platelet count is low from chemotherapy, cautious for fall and injury was advised.  4. Elevated liver enzyme - this is likely secondary to her diffuse liver metastasis  - we'll be cautious when she is on Abraxane, I'll further reduce her dose of Abraxane due to her tolerance issue  - close monitoring   5. Diabetes, hypertension, IBS, anxiety -She will continue follow-up with her primary care physician -Close monitoring during  chemotherapy  6. Social support -She lives alone, her ex-husband is supportive. She knows to call for help if needed  Plan -C3 second dose Abraxane and gemcitabine today with reduced Abraxane 37m/m2 and gem 6078mm2   -  I'll see her back in 2 weeks before cycle 4   All questions were answered. The patient knows to call the clinic with any problems, questions or concerns. I spent 25 minutes counseling the patient face to face. The total time spent in the appointment was 30 minutes and more than 50% was on counseling.      Truitt Merle, MD 01/19/2015 1:34 PM

## 2015-01-20 ENCOUNTER — Other Ambulatory Visit: Payer: Self-pay

## 2015-01-20 NOTE — Patient Outreach (Signed)
I have not heard back from Amber Bishop regarding assistance with her medication costs.  I am closing out her case.  She has pharmacy involved at the cancer clinic.  I am happy to assist in the future if needed.   Deanne Coffer, PharmD, [place] [phone]

## 2015-01-26 NOTE — Patient Outreach (Signed)
[facility]) Care Management  01/26/2015  Amber Bishop 01/14/1951 [id number]   Notification from Deanne Coffer, PharmD to close case due to unable to contact patient for [place] Management services.  Thanks, Ronnell Freshwater. [place], [place] Assistant Phone: [phone] Fax: [phone]

## 2015-01-27 ENCOUNTER — Telehealth: Payer: Self-pay | Admitting: *Deleted

## 2015-01-27 NOTE — Telephone Encounter (Signed)
Voice mail from [place] that her PCP, Dr. Dema Severin does not want her taking Mobic or Tramadol while she is on Coumadin. She is suggesting Tylenol for her arthritis pain and she wants to be sure Dr. Burr Medico is OK with her taking Tylenol for her arthritis? Forwarded message to MD.

## 2015-01-28 ENCOUNTER — Telehealth: Payer: Self-pay | Admitting: *Deleted

## 2015-01-28 NOTE — Telephone Encounter (Signed)
Left VM for Amber Bishop that OK to take Tylenol for her arthritis pain.

## 2015-01-28 NOTE — Telephone Encounter (Signed)
Yes, it's fine with me.  Amber Bishop, could you give her a call back? Thanks much.  Truitt Merle

## 2015-01-31 ENCOUNTER — Telehealth: Payer: Self-pay | Admitting: *Deleted

## 2015-01-31 NOTE — Telephone Encounter (Signed)
Amber Bishop left VM asking nurse again about the Tylenol/Tramadol. Was listening to nurse's voice mail and she pushed wrong button and cut off message. Called [name] back and made her aware that Tylenol is OK and if this does not help may use the Tramadol. Will not interfere with her Coumadin. Just watch for bruising.

## 2015-02-02 ENCOUNTER — Telehealth: Payer: Self-pay

## 2015-02-02 NOTE — Telephone Encounter (Signed)
Left message for patient to call back.  Received fax request from [place] for patients xanax.  She is a [facility] patient so we need for her to choose another Dr.

## 2015-02-03 ENCOUNTER — Telehealth: Payer: Self-pay | Admitting: Hematology

## 2015-02-03 ENCOUNTER — Ambulatory Visit (HOSPITAL_BASED_OUTPATIENT_CLINIC_OR_DEPARTMENT_OTHER): Payer: PPO | Admitting: Hematology

## 2015-02-03 ENCOUNTER — Ambulatory Visit (HOSPITAL_BASED_OUTPATIENT_CLINIC_OR_DEPARTMENT_OTHER): Payer: PPO

## 2015-02-03 ENCOUNTER — Other Ambulatory Visit (HOSPITAL_BASED_OUTPATIENT_CLINIC_OR_DEPARTMENT_OTHER): Payer: PPO

## 2015-02-03 ENCOUNTER — Encounter: Payer: Self-pay | Admitting: Hematology

## 2015-02-03 VITALS — BP 130/80 | HR 91 | Temp 98.2°F | Resp 18 | Ht 64.5 in | Wt 219.7 lb

## 2015-02-03 DIAGNOSIS — C259 Malignant neoplasm of pancreas, unspecified: Secondary | ICD-10-CM | POA: Diagnosis not present

## 2015-02-03 DIAGNOSIS — C251 Malignant neoplasm of body of pancreas: Secondary | ICD-10-CM | POA: Diagnosis not present

## 2015-02-03 DIAGNOSIS — C787 Secondary malignant neoplasm of liver and intrahepatic bile duct: Secondary | ICD-10-CM | POA: Diagnosis not present

## 2015-02-03 DIAGNOSIS — I82401 Acute embolism and thrombosis of unspecified deep veins of right lower extremity: Secondary | ICD-10-CM | POA: Diagnosis not present

## 2015-02-03 DIAGNOSIS — Z5111 Encounter for antineoplastic chemotherapy: Secondary | ICD-10-CM | POA: Diagnosis not present

## 2015-02-03 LAB — CBC WITH DIFFERENTIAL/PLATELET
BASO%: 0.5 % (ref 0.0–2.0)
BASOS ABS: 0 10*3/uL (ref 0.0–0.1)
EOS ABS: 0.2 10*3/uL (ref 0.0–0.5)
EOS%: 3 % (ref 0.0–7.0)
HEMATOCRIT: 39.5 % (ref 34.8–46.6)
HEMOGLOBIN: 13.1 g/dL (ref 11.6–15.9)
LYMPH#: 1.2 10*3/uL (ref 0.9–3.3)
LYMPH%: 14.5 % (ref 14.0–49.7)
MCH: 29 pg (ref 25.1–34.0)
MCHC: 33.2 g/dL (ref 31.5–36.0)
MCV: 87.6 fL (ref 79.5–101.0)
MONO#: 0.9 10*3/uL (ref 0.1–0.9)
MONO%: 11.1 % (ref 0.0–14.0)
NEUT#: 5.7 10*3/uL (ref 1.5–6.5)
NEUT%: 70.9 % (ref 38.4–76.8)
PLATELETS: 142 10*3/uL — AB (ref 145–400)
RBC: 4.51 10*6/uL (ref 3.70–5.45)
RDW: 15.2 % — AB (ref 11.2–14.5)
WBC: 8.1 10*3/uL (ref 3.9–10.3)

## 2015-02-03 LAB — COMPREHENSIVE METABOLIC PANEL (CC13)
ALK PHOS: 251 U/L — AB (ref 40–150)
ALT: 97 U/L — AB (ref 0–55)
ANION GAP: 8 meq/L (ref 3–11)
AST: 82 U/L — ABNORMAL HIGH (ref 5–34)
Albumin: 3.2 g/dL — ABNORMAL LOW (ref 3.5–5.0)
BILIRUBIN TOTAL: 1.17 mg/dL (ref 0.20–1.20)
BUN: 13.5 mg/dL (ref 7.0–26.0)
CALCIUM: 9.3 mg/dL (ref 8.4–10.4)
CO2: 28 mEq/L (ref 22–29)
CREATININE: 0.8 mg/dL (ref 0.6–1.1)
Chloride: 101 mEq/L (ref 98–109)
EGFR: 72 mL/min/{1.73_m2} — AB (ref >90)
Glucose: 225 mg/dl — ABNORMAL HIGH (ref 70–140)
Potassium: 3.9 mEq/L (ref 3.5–5.1)
Sodium: 137 mEq/L (ref 136–145)
TOTAL PROTEIN: 6.2 g/dL — AB (ref 6.4–8.3)

## 2015-02-03 LAB — PROTIME-INR
INR: 2.4 (ref 2.00–3.50)
Protime: 28.8 Seconds — ABNORMAL HIGH (ref 10.6–13.4)

## 2015-02-03 MED ORDER — SODIUM CHLORIDE 0.9 % IJ SOLN
10.0000 mL | INTRAMUSCULAR | Status: DC | PRN
  Administered 2015-02-03: 10 mL
  Filled 2015-02-03: qty 10

## 2015-02-03 MED ORDER — SODIUM CHLORIDE 0.9 % IV SOLN
Freq: Once | INTRAVENOUS | Status: AC
  Administered 2015-02-03: 15:00:00 via INTRAVENOUS

## 2015-02-03 MED ORDER — HEPARIN SOD (PORK) LOCK FLUSH 100 UNIT/ML IV SOLN
500.0000 [IU] | Freq: Once | INTRAVENOUS | Status: AC | PRN
  Administered 2015-02-03: 500 [IU]
  Filled 2015-02-03: qty 5

## 2015-02-03 MED ORDER — PACLITAXEL PROTEIN-BOUND CHEMO INJECTION 100 MG
40.0000 mg/m2 | Freq: Once | INTRAVENOUS | Status: AC
  Administered 2015-02-03: 75 mg via INTRAVENOUS
  Filled 2015-02-03: qty 15

## 2015-02-03 MED ORDER — SODIUM CHLORIDE 0.9 % IV SOLN
Freq: Once | INTRAVENOUS | Status: AC
  Administered 2015-02-03: 15:00:00 via INTRAVENOUS
  Filled 2015-02-03: qty 4

## 2015-02-03 MED ORDER — SODIUM CHLORIDE 0.9 % IV SOLN
600.0000 mg/m2 | Freq: Once | INTRAVENOUS | Status: AC
  Administered 2015-02-03: 1254 mg via INTRAVENOUS
  Filled 2015-02-03: qty 32.98

## 2015-02-03 NOTE — Telephone Encounter (Signed)
Pt confirmed labs/ov per 09/15 POF, gave pt AVS and Calendar.... KJ, sent msg to add chemo and gave pt barium

## 2015-02-03 NOTE — Patient Instructions (Signed)
Cancer Center Discharge Instructions for Patients Receiving Chemotherapy  Today you received the following chemotherapy agents Abraxane/Gemzar.  To help prevent nausea and vomiting after your treatment, we encourage you to take your nausea medication as directed.   If you develop nausea and vomiting that is not controlled by your nausea medication, call the clinic.   BELOW ARE SYMPTOMS THAT SHOULD BE REPORTED IMMEDIATELY:  *FEVER GREATER THAN 100.5 F  *CHILLS WITH OR WITHOUT FEVER  NAUSEA AND VOMITING THAT IS NOT CONTROLLED WITH YOUR NAUSEA MEDICATION  *UNUSUAL SHORTNESS OF BREATH  *UNUSUAL BRUISING OR BLEEDING  TENDERNESS IN MOUTH AND THROAT WITH OR WITHOUT PRESENCE OF ULCERS  *URINARY PROBLEMS  *BOWEL PROBLEMS  UNUSUAL RASH Items with * indicate a potential emergency and should be followed up as soon as possible.  Feel free to call the clinic you have any questions or concerns. The clinic phone number is [phone].  Please show the CHEMO ALERT CARD at check-in to the Emergency Department and triage nurse.    

## 2015-02-03 NOTE — Progress Notes (Signed)
[facility]  [phone] [phone]  Clinic follow up Note   Patient Care Team: Harlan Stains, MD as PCP - General (Family Medicine) Truitt Merle, MD as Consulting Physician (Hematology and Oncology) Delrae Rend, MD as Consulting Physician (Endocrinology) Tania Ade, RN as Registered Nurse (Oncology) 02/03/2015  CHIEF COMPLAINTS:  Follow up thrombocytopenia and metastatic pancreatic cancer  Oncology History   Pancreatic cancer metastasized to liver   Staging form: Pancreas, AJCC 7th Edition     Clinical: Stage IV (T2, NX, M1) - Unsigned       Pancreatic cancer metastasized to liver   08/10/2014 Imaging CT abdomen showed 5.4 x 3.2 cm pancreatic body mass consistent with pancreatic adenocarcinoma. There is adjacent lymphadenopathy and diffuse  hepatic metastatic disease.   08/17/2014 Pathology Results Liver biopsy showed adenocarcinoma, consistent with pancreatic primary.   08/17/2014 Initial Diagnosis Pancreatic cancer metastasized to liver   08/24/2014 Tumor Marker CA19.9 >140000   08/31/2014 Imaging CT chest was negative for   09/02/2014 - 10/14/2014 Chemotherapy mFOLFOX, 4 cycles    10/26/2014 Progression CT chest, abdomen and pelvis showed probable disease progression in the liver and a stable primary pancreatic body mass.   11/02/2014 -  Chemotherapy Second line chemotherapy with gemcitabine and Abraxane   11/06/2014 - 11/15/2014 Hospital Admission She was admitted for right lower extremity DVT and cellulitis. She was discharged home with Coumadin and Lovenox bridging.   12/27/2014 Imaging CT scan showed stable pancreatic mass and hepatic metastasis, no other new lesions.    HISTORY OF PRESENTING ILLNESS:  Amber Bishop 64 y.o. female was referred by her primary care physician Dr. Dema Severin for thrombocytopenia. She also was recently diagnosed with metastatic pancreatic cancer.   She has had a chronic thrombocytopenia since 2013. Her plt count has been  in the range of 100-150, but dropped to the range of 80s lately. She denies any signs of bleeding. No history of liver disease.  She has been diabetic for 24 years, and her blood glucose has been out of control in the past one year. She also reports fatigue in the past one year. She denies any significant abdominal pain, jaundice, bloating, or change of her bowel habits. Her appetite has been normal. No nausea or vomiting. She is able to do all selfcare, but she sleeps more than usually during the day. No abdominal pain or bloating. She was found to have abnormal LFTs on routine lab work, and her PCP Dr. Dema Severin, who ordered and abdominal US which lead to a CT scan, which showed a 5.4 x 3.4 cm pancreatic body mass, adjacent lymphadenopathy, and diffuse hepatic metastasis. She underwent ultrasound-guided liver biopsy on 08/17/2014, and the biopsy showed adenocarcinoma consistent with pancreatic primary.   CURRENT THERAPY: Gemcitabine 800 mg/m, Abraxane 60 mg/m, on day 1, 8, day 15 of 28 day cycle, dose reduced and the change to two-week on, one-week off from cycle 3.  INTERIM HISTORY: [place] returns for follow-up and cycle 4 chemo. She developed some headache and neck pain a week ago, the pain doesn't radiate to shoulder or arms, no other neurological symptoms, she was seen by her primary care physician Dr. Dema Severin and she prescribed tramadol for her. She otherwise doing well, denies any other pain, nausea, or other symptoms. She is eating well. She has some fatigue after last cycle chemotherapy, but s realization. he recovered well.  MEDICAL HISTORY:  Past Medical History  Diagnosis Date  . Anxiety   . Hypertension   .  IBS (irritable bowel syndrome)   . Hyperplastic colon polyp   . Allergy   . Anemia   . Arthritis   . Diverticulosis   . Dilated aortic root     seen on prior echo but echo 05/2013 showed normal dimensions  . Bicuspid aortic valve   . LVE (left ventricular enlargement)     mild by  echo 1/15 with EF 50-55%  . pancreatic ca w/ liver mets dx'd 07/2014  . Diabetes mellitus without complication     SURGICAL HISTORY: Past Surgical History  Procedure Laterality Date  . Tubal ligation      age 64  . Colonoscopy    . Polypectomy    . Tooth extraction      3 teeth  . Belpharoptosis repair      SOCIAL HISTORY: History   Social History  . Marital Status: Single    Spouse Name: N/A  . Number of Children: 1 son   . Years of Education: N/A   Occupational History  . 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 Smoking status: Never Smoker   . Smokeless tobacco: Never Used  . Alcohol Use: No  . Drug Use: No  . Sexual Activity: Not on file   Other Topics Concern  . Not on file   Social History Narrative    FAMILY HISTORY: Family History  Problem Relation Age of Onset  . Colon cancer Neg Hx   . Esophageal cancer Neg Hx   . Stomach cancer Neg Hx   . Asthma Mother     ALLERGIES:  is allergic to naprosyn and metformin and related.  MEDICATIONS:  Current Outpatient Prescriptions  Medication Sig Dispense Refill  . ALPRAZolam (XANAX) 0.5 MG tablet TAKE 1 TABLET THREE TIMES DAILY IF NEEDED FOR ANXIETY 90 tablet 3  . amLODipine (NORVASC) 5 MG tablet Take 5 mg by mouth every morning.     . blood glucose meter kit and supplies KIT Dispense based on patient and insurance preference. Use up to four times daily as directed. Dx code: E11.65 1 each 0  . Calcium Carbonate-Vitamin D (CALCIUM 600/VITAMIN D) 600-400 MG-UNIT per tablet Take 1 tablet by mouth 2 (two) times daily.     . cetirizine (ZYRTEC) 10 MG tablet Take 10 mg by mouth daily.    . cholestyramine (QUESTRAN) 4 G packet Take 1/2-1 packet dissolved in water/juice once daily....................Marland KitchenPRN (Patient taking differently: Take 2-4 g by mouth daily as needed (IBS). ) 90 each 3  . dextromethorphan (DELSYM) 30 MG/5ML liquid Take 15 mg by mouth 2 (two) times daily as needed for cough.    .  diphenhydrAMINE-zinc acetate (BENADRYL) cream Apply topically 2 (two) times daily as needed for itching. 28.4 g 0  . FLUoxetine (PROZAC) 20 MG capsule Take 20 mg by mouth every morning.     Marland Kitchen FLUZONE HIGH-DOSE 0.5 ML SUSY inject 0.5 milliliter intramuscularly  0  . glucose blood (ACCU-CHEK AVIVA PLUS) test strip Test 4 times daily as instructed. 125 each 3  . insulin aspart (NOVOLOG FLEXPEN) 100 UNIT/ML FlexPen Inject 14-18 units into the skin 3 times daily as instructed. (Patient taking differently: Inject 24-28 Units into the skin 3 (three) times daily with meals. ) 15 mL 2  . insulin glargine (LANTUS) 100 UNIT/ML injection Inject 74 Units into the skin at bedtime.     . Insulin Pen Needle (VALUMARK PEN NEEDLES) 31G X 8 MM MISC Use to inject insulin 4  times daily as instructed. 150 each 3  . LANCETS ULTRA THIN 30G MISC Use to test blood sugar 4 times daily as instructed. Dx code: E11.65 200 each 2  . lidocaine-prilocaine (EMLA) cream Apply 1 application topically as needed. 30 g 3  . Multiple Vitamins-Minerals (CENTRUM SILVER PO) Take 1 capsule by mouth every morning.     . Omega-3 Fatty Acids (FISH OIL) 1000 MG CAPS Take 1 capsule by mouth every morning.     Marland Kitchen OVER THE COUNTER MEDICATION Apply 1 drop topically 3 (three) times daily. Essential Oils.    . valsartan-hydrochlorothiazide (DIOVAN-HCT) 320-12.5 MG per tablet Take 1 tablet by mouth every morning.     . warfarin (COUMADIN) 7.5 MG tablet One tab daily except 1.5 tab on Wednesday and Friday 40 tablet 1   No current facility-administered medications for this visit.   Facility-Administered Medications Ordered in Other Visits  Medication Dose Route Frequency Provider Last Rate Last Dose  . sodium chloride 0.9 % injection 10 mL  10 mL Intracatheter PRN Truitt Merle, MD   10 mL at 12/15/14 1701    REVIEW OF SYSTEMS:   Constitutional: Denies fevers, chills or abnormal night sweats Eyes: Denies blurriness of vision, double vision or watery  eyes Ears, nose, mouth, throat, and face: Denies mucositis or sore throat Respiratory: Denies cough, dyspnea or wheezes Cardiovascular: Denies palpitation, chest discomfort or lower extremity swelling Gastrointestinal:  Denies nausea, heartburn or change in bowel habits Skin: Denies abnormal skin rashes Lymphatics: Denies new lymphadenopathy or easy bruising Neurological:Denies numbness, tingling or new weaknesses Behavioral/Psych: Mood is stable, no new changes  All other systems were reviewed with the patient and are negative.   PHYSICAL EXAMINATION: ECOG PERFORMANCE STATUS: 1 - Symptomatic but completely ambulatory  Filed Vitals:   02/03/15 1419  BP: 130/80  Pulse: 91  Temp: 98.2 F (36.8 C)  Resp: 18   Filed Weights   02/03/15 1419  Weight: 219 lb 11.2 oz (99.655 kg)    GENERAL:alert, no distress and comfortable SKIN: skin color, texture, turgor are normal, no rashes or significant lesions except (+) mild skin pigmentation in the right lower extremity EYES: normal, conjunctiva are pink and non-injected, sclera clear OROPHARYNX:no exudate, no erythema and lips, buccal mucosa, and tongue normal  NECK: supple, thyroid normal size, non-tender, without nodularity LYMPH:  no palpable lymphadenopathy in the cervical, axillary or inguinal LUNGS: clear to auscultation and percussion with normal breathing effort HEART: regular rate & rhythm and no murmurs and no lower extremity edema ABDOMEN:abdomen soft, non-tender and normal bowel sounds Musculoskeletal:no cyanosis of digits and no clubbing. (+) Mild pitting edema in the right lower extremity above the ankle. PSYCH: alert & oriented x 3 with fluent speech NEURO: no focal motor/sensory deficits  LABORATORY DATA:  I have reviewed the data as listed CBC Latest Ref Rng 02/03/2015 01/19/2015 01/04/2015  WBC 3.9 - 10.3 10e3/uL 8.1 6.3 5.1  Hemoglobin 11.6 - 15.9 g/dL 13.1 12.6 12.4  Hematocrit 34.8 - 46.6 % 39.5 38.6 38.5  Platelets  145 - 400 10e3/uL 142(L) 92(L) 115(L)    CMP Latest Ref Rng 02/03/2015 01/19/2015 01/04/2015  Glucose 70 - 140 mg/dl 225(H) 302(H) 301(H)  BUN 7.0 - 26.0 mg/dL 13.5 13.0 11.4  Creatinine 0.6 - 1.1 mg/dL 0.8 1.0 0.9  Sodium 136 - 145 mEq/L 137 141 138  Potassium 3.5 - 5.1 mEq/L 3.9 4.1 3.7  Chloride 101 - 111 mmol/L - - -  CO2 22 - 29 mEq/L 28 30(H) 26  Calcium 8.4 - 10.4 mg/dL 9.3 9.3 8.8  Total Protein 6.4 - 8.3 g/dL 6.2(L) 6.1(L) 5.9(L)  Total Bilirubin 0.20 - 1.20 mg/dL 1.17 0.67 0.55  Alkaline Phos 40 - 150 U/L 251(H) 256(H) 220(H)  AST 5 - 34 U/L 82(H) 89(H) 56(H)  ALT 0 - 55 U/L 97(H) 103(H) 72(H)    PATHOLOGY REPORT: Diagnosis 08/17/2014 Liver, needle/core biopsy, left - ADENOCARCINOMA. Microscopic Comment The morphologic features are compatible with metastatic pancreatic adenocarcinoma.  RADIOGRAPHIC STUDIES: I have personally reviewed the radiological images as listed and agreed with the findings in the report.  CT chest, abdomen and pelvis with contrast 12/27/2014 IMPRESSION: 1. No evidence for metastatic disease in the thorax. 2. Mild circumferential wall thickening in the distal esophagus, stable. This may be related to esophagitis. 3. No substantial change in hypo enhancing lesion in the body the pancreas. 4. Multiple hepatic metastases show no substantial interval change. Metastatic involvement of the liver is seen on a background of geographic steatosis and left intrahepatic biliary duct dilatation is stable. 5. 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 6. There is a new small area of sclerosis in the left aspect of the L1 vertebral body. Attention to this region on followup is recommended as bony metastatic disease is a concern.   ASSESSMENT & PLAN:  64 year old Caucasian female, with past medical history of diabetes, hypertension, IBS, arthritis, who was found to have worsening thrombocytopenia, and a  newly diagnosed metastatic rectal cancer.  1. Pancreatic cancer with liver metastasis -I reviewed her initial CT chest, abdomen and pelvis scan findings in the liver mass biopsy results in great detail. The image was reviewed in person. -I discussed the incurable nature of metastatic pancreas cancer and overall poor prognosis. However I am little concerned about how much she really grasp about this. She does have very positive  Attitude  -I recommend systemic chemotherapy. The goal of treatment is palliation and to prolong her life. -I discussed the option of single agent gemcitabine, gemcitabine with Abraxane, FOLFOX or FOLFIRINOX -Due to her significant thrombocytopenia, I think gemcitabine-based regimen would be difficult to tolerate. I recommend a FOLFOX as first line therapy, unfortunately she did not response well  -She is currently on second line gemcitabine and Abraxane, due to disease progression -She is tolerating does reduced gemcitabine and Abraxane well, we'll proceed cycle 4 day 1 treatment today. She will be  2 weeks on, one-week off  -We'll follow up of her tumor marker CA 19.9 monthly   2. Thrombocytopenia -This appears to be chronic, she has no history of liver disease, CT scan did not reveal liver cirrhosis or spleenmagely. -This is possible ITP, but she did not respond well to a trial of prednisone -As a bone marrow disease is also possible. Giving the overall poor prognosis from a metastatic pancreas cancer, I did not recommend a bone marrow biopsy at this point. -Her platelet count recovered to normal after first dose of gemcitabine and Abraxane. -We'll continue monitoring, platelet count recovered well 140 2K today   3. RLE DVT -on coumadin now  -She will transition her Coumadin monitoring to her primary care physician Dr. Dema Severin  -We discussed the high risk of bleeding from comadin when her platelet count is low from chemotherapy, cautious for fall and injury was  advised.  4. Elevated liver enzyme - this is likely secondary to her diffuse liver metastasis  - we'll be cautious when she is on Abraxane, I'll further  reduce her dose of Abraxane due to her tolerance issue  - close monitoring   5. Diabetes, hypertension, IBS, anxiety -She will continue follow-up with her primary care physician -Close monitoring during chemotherapy  6. Social support -She lives alone, her ex-husband is supportive. She knows to call for help if needed  Plan -C4 weekly Abraxane and gemcitabine today and next week, then off one week -Restaging CT scan in 3 weeks  -I'll see her back in 3 weeks before cycle 5  All questions were answered. The patient knows to call the clinic with any problems, questions or concerns. I spent 25 minutes counseling the patient face to face. The total time spent in the appointment was 30 minutes and more than 50% was on counseling.      Truitt Merle, MD 02/03/2015 2:25 PM

## 2015-02-04 ENCOUNTER — Telehealth: Payer: Self-pay | Admitting: *Deleted

## 2015-02-04 NOTE — Telephone Encounter (Signed)
Per staff message and POF I have scheduled appts. Advised scheduler of appts. JMW  

## 2015-02-10 ENCOUNTER — Ambulatory Visit (HOSPITAL_BASED_OUTPATIENT_CLINIC_OR_DEPARTMENT_OTHER): Payer: PPO | Admitting: Pharmacist

## 2015-02-10 ENCOUNTER — Ambulatory Visit (HOSPITAL_BASED_OUTPATIENT_CLINIC_OR_DEPARTMENT_OTHER): Payer: PPO

## 2015-02-10 ENCOUNTER — Other Ambulatory Visit (HOSPITAL_BASED_OUTPATIENT_CLINIC_OR_DEPARTMENT_OTHER): Payer: PPO

## 2015-02-10 ENCOUNTER — Other Ambulatory Visit: Payer: PPO

## 2015-02-10 VITALS — BP 142/72 | HR 79 | Temp 97.7°F | Resp 16

## 2015-02-10 DIAGNOSIS — Z5111 Encounter for antineoplastic chemotherapy: Secondary | ICD-10-CM | POA: Diagnosis not present

## 2015-02-10 DIAGNOSIS — C787 Secondary malignant neoplasm of liver and intrahepatic bile duct: Secondary | ICD-10-CM | POA: Diagnosis not present

## 2015-02-10 DIAGNOSIS — C259 Malignant neoplasm of pancreas, unspecified: Secondary | ICD-10-CM

## 2015-02-10 DIAGNOSIS — I82401 Acute embolism and thrombosis of unspecified deep veins of right lower extremity: Secondary | ICD-10-CM

## 2015-02-10 DIAGNOSIS — C251 Malignant neoplasm of body of pancreas: Secondary | ICD-10-CM | POA: Diagnosis not present

## 2015-02-10 LAB — CBC WITH DIFFERENTIAL/PLATELET
BASO%: 0.8 % (ref 0.0–2.0)
BASOS ABS: 0 10*3/uL (ref 0.0–0.1)
EOS ABS: 0.1 10*3/uL (ref 0.0–0.5)
EOS%: 1.1 % (ref 0.0–7.0)
HEMATOCRIT: 37.4 % (ref 34.8–46.6)
HEMOGLOBIN: 12.3 g/dL (ref 11.6–15.9)
LYMPH#: 1.3 10*3/uL (ref 0.9–3.3)
LYMPH%: 23.1 % (ref 14.0–49.7)
MCH: 28.6 pg (ref 25.1–34.0)
MCHC: 32.7 g/dL (ref 31.5–36.0)
MCV: 87.5 fL (ref 79.5–101.0)
MONO#: 0.7 10*3/uL (ref 0.1–0.9)
MONO%: 13.2 % (ref 0.0–14.0)
NEUT%: 61.8 % (ref 38.4–76.8)
NEUTROS ABS: 3.4 10*3/uL (ref 1.5–6.5)
PLATELETS: 111 10*3/uL — AB (ref 145–400)
RBC: 4.28 10*6/uL (ref 3.70–5.45)
RDW: 15.7 % — AB (ref 11.2–14.5)
WBC: 5.6 10*3/uL (ref 3.9–10.3)

## 2015-02-10 LAB — COMPREHENSIVE METABOLIC PANEL (CC13)
ALT: 149 U/L — ABNORMAL HIGH (ref 0–55)
ANION GAP: 8 meq/L (ref 3–11)
AST: 112 U/L — ABNORMAL HIGH (ref 5–34)
Albumin: 3.3 g/dL — ABNORMAL LOW (ref 3.5–5.0)
Alkaline Phosphatase: 300 U/L — ABNORMAL HIGH (ref 40–150)
BUN: 15.7 mg/dL (ref 7.0–26.0)
CALCIUM: 9.1 mg/dL (ref 8.4–10.4)
CHLORIDE: 106 meq/L (ref 98–109)
CO2: 29 meq/L (ref 22–29)
Creatinine: 0.9 mg/dL (ref 0.6–1.1)
EGFR: 71 mL/min/{1.73_m2} — ABNORMAL LOW (ref >90)
Glucose: 113 mg/dl (ref 70–140)
POTASSIUM: 3.6 meq/L (ref 3.5–5.1)
Sodium: 142 mEq/L (ref 136–145)
Total Bilirubin: 1.05 mg/dL (ref 0.20–1.20)
Total Protein: 6.1 g/dL — ABNORMAL LOW (ref 6.4–8.3)

## 2015-02-10 LAB — CANCER ANTIGEN 19-9

## 2015-02-10 LAB — POCT INR
INR: 3.4
INR: 3.4

## 2015-02-10 LAB — PROTIME-INR
INR: 3.4 (ref 2.00–3.50)
PROTIME: 40.8 s — AB (ref 10.6–13.4)

## 2015-02-10 MED ORDER — HEPARIN SOD (PORK) LOCK FLUSH 100 UNIT/ML IV SOLN
500.0000 [IU] | Freq: Once | INTRAVENOUS | Status: AC | PRN
  Administered 2015-02-10: 500 [IU]
  Filled 2015-02-10: qty 5

## 2015-02-10 MED ORDER — SODIUM CHLORIDE 0.9 % IV SOLN
Freq: Once | INTRAVENOUS | Status: AC
  Administered 2015-02-10: 11:00:00 via INTRAVENOUS

## 2015-02-10 MED ORDER — PACLITAXEL PROTEIN-BOUND CHEMO INJECTION 100 MG
40.0000 mg/m2 | Freq: Once | INTRAVENOUS | Status: AC
  Administered 2015-02-10: 75 mg via INTRAVENOUS
  Filled 2015-02-10: qty 15

## 2015-02-10 MED ORDER — GEMCITABINE HCL CHEMO INJECTION 1 GM/26.3ML
600.0000 mg/m2 | Freq: Once | INTRAVENOUS | Status: AC
  Administered 2015-02-10: 1254 mg via INTRAVENOUS
  Filled 2015-02-10: qty 32.98

## 2015-02-10 MED ORDER — SODIUM CHLORIDE 0.9 % IV SOLN
Freq: Once | INTRAVENOUS | Status: AC
  Administered 2015-02-10: 11:00:00 via INTRAVENOUS
  Filled 2015-02-10: qty 4

## 2015-02-10 MED ORDER — SODIUM CHLORIDE 0.9 % IJ SOLN
10.0000 mL | INTRAMUSCULAR | Status: DC | PRN
  Administered 2015-02-10: 10 mL
  Filled 2015-02-10: qty 10

## 2015-02-10 NOTE — Progress Notes (Signed)
INR slightly above goal today. INR goal = 2-2.5 Hg/Hct:  12.3/37.4, Pltc:111 LFT's continuing to increase. Pt took coumadin as instructed. No missed or extra coumadin doses. No changes in diet or medications. No unusual bruising.  No bleeding noted. No s/s clotting noted. INR slightly elevated at last visit. INR further elevated today. Will decrease coumadin dose. Take 3.75mg  (1/2 tablet) today only. On 02/11/15, begin 7.5mg  (1 tablet) daily.  Recheck INR in 1 week with scheduled lab appt at 12pm and coumadin clinic at 12:15pm. Recheck CBC, CMET and INR as well with scheduled lab appt next week. Next week will be the last coumadin clinic visit before patient transitions INR monitoring to her PCP. No charge encounter.

## 2015-02-10 NOTE — Progress Notes (Signed)
Reviewed labs-noted elevated Alk. Phos, AST and ALT. Reviewed Dr. Burr Medico and ok to treat per Dr. Burr Medico.

## 2015-02-10 NOTE — Patient Instructions (Addendum)
[facility] Discharge Instructions for Patients Receiving Chemotherapy  Today you received the following chemotherapy agents: Abraxane and Gemzar   To help prevent nausea and vomiting after your treatment, we encourage you to take your nausea medication as directed.    If you develop nausea and vomiting that is not controlled by your nausea medication, call the clinic.   BELOW ARE SYMPTOMS THAT SHOULD BE REPORTED IMMEDIATELY:  *FEVER GREATER THAN 100.5 F  *CHILLS WITH OR WITHOUT FEVER  NAUSEA AND VOMITING THAT IS NOT CONTROLLED WITH YOUR NAUSEA MEDICATION  *UNUSUAL SHORTNESS OF BREATH  *UNUSUAL BRUISING OR BLEEDING  TENDERNESS IN MOUTH AND THROAT WITH OR WITHOUT PRESENCE OF ULCERS  *URINARY PROBLEMS  *BOWEL PROBLEMS  UNUSUAL RASH Items with * indicate a potential emergency and should be followed up as soon as possible.  Feel free to call the clinic should you have any questions or concerns. The clinic phone number is [phone].  Please show the CHEMO ALERT CARD at check-in to the Emergency Department and triage nurse.   

## 2015-02-10 NOTE — Patient Instructions (Signed)
**Note De-Identified Maxamillian Tienda Obfuscation** Take 3.75mg  (1/2 tablet) today. On 02/11/15, begin 7.5mg  (1 tablet) daily.  Recheck INR in 1 week with scheduled lab appt at 12pm and coumadin clinic at 12:15pm.

## 2015-02-17 ENCOUNTER — Ambulatory Visit (HOSPITAL_BASED_OUTPATIENT_CLINIC_OR_DEPARTMENT_OTHER): Payer: PPO | Admitting: Pharmacist

## 2015-02-17 ENCOUNTER — Telehealth: Payer: Self-pay | Admitting: Internal Medicine

## 2015-02-17 ENCOUNTER — Other Ambulatory Visit (HOSPITAL_BASED_OUTPATIENT_CLINIC_OR_DEPARTMENT_OTHER): Payer: PPO

## 2015-02-17 DIAGNOSIS — I82401 Acute embolism and thrombosis of unspecified deep veins of right lower extremity: Secondary | ICD-10-CM | POA: Diagnosis not present

## 2015-02-17 LAB — CBC WITH DIFFERENTIAL/PLATELET
BASO%: 0.2 % (ref 0.0–2.0)
Basophils Absolute: 0 10*3/uL (ref 0.0–0.1)
EOS%: 1.8 % (ref 0.0–7.0)
Eosinophils Absolute: 0.1 10*3/uL (ref 0.0–0.5)
HCT: 35.5 % (ref 34.8–46.6)
HGB: 11.4 g/dL — ABNORMAL LOW (ref 11.6–15.9)
LYMPH%: 25.2 % (ref 14.0–49.7)
MCH: 28.9 pg (ref 25.1–34.0)
MCHC: 32.1 g/dL (ref 31.5–36.0)
MCV: 89.9 fL (ref 79.5–101.0)
MONO#: 0.7 10*3/uL (ref 0.1–0.9)
MONO%: 13.1 % (ref 0.0–14.0)
NEUT%: 59.7 % (ref 38.4–76.8)
NEUTROS ABS: 3 10*3/uL (ref 1.5–6.5)
Platelets: 67 10*3/uL — ABNORMAL LOW (ref 145–400)
RBC: 3.95 10*6/uL (ref 3.70–5.45)
RDW: 16 % — ABNORMAL HIGH (ref 11.2–14.5)
WBC: 5 10*3/uL (ref 3.9–10.3)
lymph#: 1.3 10*3/uL (ref 0.9–3.3)

## 2015-02-17 LAB — COMPREHENSIVE METABOLIC PANEL (CC13)
ALT: 157 U/L — AB (ref 0–55)
AST: 95 U/L — ABNORMAL HIGH (ref 5–34)
Albumin: 3.1 g/dL — ABNORMAL LOW (ref 3.5–5.0)
Alkaline Phosphatase: 324 U/L — ABNORMAL HIGH (ref 40–150)
Anion Gap: 7 mEq/L (ref 3–11)
BILIRUBIN TOTAL: 0.97 mg/dL (ref 0.20–1.20)
BUN: 15.3 mg/dL (ref 7.0–26.0)
CHLORIDE: 103 meq/L (ref 98–109)
CO2: 29 meq/L (ref 22–29)
Calcium: 9 mg/dL (ref 8.4–10.4)
Creatinine: 0.9 mg/dL (ref 0.6–1.1)
EGFR: 68 mL/min/{1.73_m2} — AB (ref >90)
GLUCOSE: 280 mg/dL — AB (ref 70–140)
Potassium: 3.7 mEq/L (ref 3.5–5.1)
Sodium: 139 mEq/L (ref 136–145)
TOTAL PROTEIN: 5.8 g/dL — AB (ref 6.4–8.3)

## 2015-02-17 LAB — PROTIME-INR
INR: 3 (ref 2.00–3.50)
Protime: 36 Seconds — ABNORMAL HIGH (ref 10.6–13.4)

## 2015-02-17 LAB — POCT INR: INR: 3

## 2015-02-17 MED ORDER — ALPRAZOLAM 0.5 MG PO TABS: ORAL_TABLET | ORAL | Status: DC

## 2015-02-17 NOTE — Telephone Encounter (Signed)
Doc of the day, Please advise. 

## 2015-02-17 NOTE — Telephone Encounter (Signed)
Refill x 3 ok 

## 2015-02-17 NOTE — Progress Notes (Addendum)
**  Last coumadin clinic visit at [facility]**  INR slightly above goal today.  Goal INR=2-2.5.  H/H=11.4/35.5 Plt=67k.  No bleeding/bruising no changes in medications.  Amber Bishop does not have chemo this week.  She is scheduled to have a CT next week along with MD appt/chemo.  Amber Bishop is to take 3.75mg  of coumadin today, then resume 7.5mg  daily.  Amber Bishop INR has been fairly stable on coumadin 7.5mg  daily since her DVT in June 2016.  Dr Harlan Stains with [facility] will be managing her coumadin.  I have called her office and left a message.  I recommend that Amber Bishop have her INR checked next week.  She will be coming to [facility] next week for labs.  The person I spoke to at [facility] stated they have access to [facility] and could f/u INR drawn at [facility].  Dr Orest Dikes office will call Amber Bishop with further details and coordinate her care pertaining to coumadin management.  Dr Dema Severin office called and stated they do not have access to [facility].  I will fax this coumadin clinic note to Dr. Dema Severin office [phone]).

## 2015-02-17 NOTE — Telephone Encounter (Signed)
ok 'd per Dr. Carlean Purl alprazolam refilled #[id number] with 2 refills until patient's appt with Dr. Silverio Decamp. Patient informed.

## 2015-02-18 ENCOUNTER — Other Ambulatory Visit: Payer: Self-pay | Admitting: Pharmacist

## 2015-02-18 DIAGNOSIS — Z7901 Long term (current) use of anticoagulants: Secondary | ICD-10-CM

## 2015-02-24 ENCOUNTER — Emergency Department (HOSPITAL_COMMUNITY): Payer: PPO

## 2015-02-24 ENCOUNTER — Encounter: Payer: PPO | Admitting: Hematology

## 2015-02-24 ENCOUNTER — Encounter (HOSPITAL_COMMUNITY): Payer: Self-pay

## 2015-02-24 ENCOUNTER — Ambulatory Visit: Payer: PPO

## 2015-02-24 ENCOUNTER — Encounter: Payer: Self-pay | Admitting: Hematology

## 2015-02-24 ENCOUNTER — Ambulatory Visit (HOSPITAL_COMMUNITY): Admission: RE | Admit: 2015-02-24 | Payer: PPO | Source: Ambulatory Visit

## 2015-02-24 ENCOUNTER — Inpatient Hospital Stay (HOSPITAL_COMMUNITY)
Admission: EM | Admit: 2015-02-24 | Discharge: 2015-03-01 | DRG: 435 | Disposition: A | Discharge status: Hospice | Payer: PPO | Attending: Internal Medicine | Admitting: Internal Medicine

## 2015-02-24 ENCOUNTER — Encounter (HOSPITAL_COMMUNITY): Payer: Self-pay | Admitting: Radiology

## 2015-02-24 ENCOUNTER — Other Ambulatory Visit: Payer: PPO

## 2015-02-24 DIAGNOSIS — I82409 Acute embolism and thrombosis of unspecified deep veins of unspecified lower extremity: Secondary | ICD-10-CM | POA: Diagnosis present

## 2015-02-24 DIAGNOSIS — R188 Other ascites: Secondary | ICD-10-CM | POA: Diagnosis present

## 2015-02-24 DIAGNOSIS — D689 Coagulation defect, unspecified: Secondary | ICD-10-CM | POA: Diagnosis present

## 2015-02-24 DIAGNOSIS — Z825 Family history of asthma and other chronic lower respiratory diseases: Secondary | ICD-10-CM | POA: Diagnosis not present

## 2015-02-24 DIAGNOSIS — E46 Unspecified protein-calorie malnutrition: Secondary | ICD-10-CM | POA: Diagnosis present

## 2015-02-24 DIAGNOSIS — K589 Irritable bowel syndrome without diarrhea: Secondary | ICD-10-CM | POA: Diagnosis present

## 2015-02-24 DIAGNOSIS — Z66 Do not resuscitate: Secondary | ICD-10-CM | POA: Diagnosis present

## 2015-02-24 DIAGNOSIS — Z886 Allergy status to analgesic agent status: Secondary | ICD-10-CM

## 2015-02-24 DIAGNOSIS — T45515A Adverse effect of anticoagulants, initial encounter: Secondary | ICD-10-CM | POA: Diagnosis present

## 2015-02-24 DIAGNOSIS — C259 Malignant neoplasm of pancreas, unspecified: Principal | ICD-10-CM | POA: Insufficient documentation

## 2015-02-24 DIAGNOSIS — Z79899 Other long term (current) drug therapy: Secondary | ICD-10-CM

## 2015-02-24 DIAGNOSIS — K831 Obstruction of bile duct: Secondary | ICD-10-CM | POA: Insufficient documentation

## 2015-02-24 DIAGNOSIS — I82501 Chronic embolism and thrombosis of unspecified deep veins of right lower extremity: Secondary | ICD-10-CM

## 2015-02-24 DIAGNOSIS — Z6836 Body mass index (BMI) 36.0-36.9, adult: Secondary | ICD-10-CM

## 2015-02-24 DIAGNOSIS — Z86718 Personal history of other venous thrombosis and embolism: Secondary | ICD-10-CM | POA: Diagnosis not present

## 2015-02-24 DIAGNOSIS — D696 Thrombocytopenia, unspecified: Secondary | ICD-10-CM

## 2015-02-24 DIAGNOSIS — I10 Essential (primary) hypertension: Secondary | ICD-10-CM | POA: Diagnosis present

## 2015-02-24 DIAGNOSIS — C7951 Secondary malignant neoplasm of bone: Secondary | ICD-10-CM | POA: Diagnosis present

## 2015-02-24 DIAGNOSIS — R918 Other nonspecific abnormal finding of lung field: Secondary | ICD-10-CM | POA: Diagnosis present

## 2015-02-24 DIAGNOSIS — Z86010 Personal history of colonic polyps: Secondary | ICD-10-CM | POA: Diagnosis not present

## 2015-02-24 DIAGNOSIS — F419 Anxiety disorder, unspecified: Secondary | ICD-10-CM | POA: Diagnosis present

## 2015-02-24 DIAGNOSIS — IMO0001 Reserved for inherently not codable concepts without codable children: Secondary | ICD-10-CM | POA: Diagnosis present

## 2015-02-24 DIAGNOSIS — E1165 Type 2 diabetes mellitus with hyperglycemia: Secondary | ICD-10-CM | POA: Diagnosis present

## 2015-02-24 DIAGNOSIS — Z9221 Personal history of antineoplastic chemotherapy: Secondary | ICD-10-CM | POA: Diagnosis not present

## 2015-02-24 DIAGNOSIS — Z794 Long term (current) use of insulin: Secondary | ICD-10-CM

## 2015-02-24 DIAGNOSIS — Z7901 Long term (current) use of anticoagulants: Secondary | ICD-10-CM

## 2015-02-24 DIAGNOSIS — R740 Nonspecific elevation of levels of transaminase and lactic acid dehydrogenase [LDH]: Secondary | ICD-10-CM | POA: Diagnosis present

## 2015-02-24 DIAGNOSIS — F329 Major depressive disorder, single episode, unspecified: Secondary | ICD-10-CM | POA: Diagnosis present

## 2015-02-24 DIAGNOSIS — Z515 Encounter for palliative care: Secondary | ICD-10-CM | POA: Diagnosis not present

## 2015-02-24 DIAGNOSIS — R112 Nausea with vomiting, unspecified: Secondary | ICD-10-CM | POA: Diagnosis present

## 2015-02-24 DIAGNOSIS — E860 Dehydration: Secondary | ICD-10-CM | POA: Diagnosis present

## 2015-02-24 DIAGNOSIS — C786 Secondary malignant neoplasm of retroperitoneum and peritoneum: Secondary | ICD-10-CM | POA: Diagnosis present

## 2015-02-24 DIAGNOSIS — Z888 Allergy status to other drugs, medicaments and biological substances status: Secondary | ICD-10-CM | POA: Diagnosis not present

## 2015-02-24 DIAGNOSIS — C773 Secondary and unspecified malignant neoplasm of axilla and upper limb lymph nodes: Secondary | ICD-10-CM

## 2015-02-24 DIAGNOSIS — C787 Secondary malignant neoplasm of liver and intrahepatic bile duct: Secondary | ICD-10-CM | POA: Diagnosis present

## 2015-02-24 DIAGNOSIS — C251 Malignant neoplasm of body of pancreas: Secondary | ICD-10-CM

## 2015-02-24 DIAGNOSIS — R1013 Epigastric pain: Secondary | ICD-10-CM | POA: Diagnosis not present

## 2015-02-24 DIAGNOSIS — M7989 Other specified soft tissue disorders: Secondary | ICD-10-CM | POA: Diagnosis present

## 2015-02-24 DIAGNOSIS — G893 Neoplasm related pain (acute) (chronic): Secondary | ICD-10-CM | POA: Diagnosis present

## 2015-02-24 DIAGNOSIS — C779 Secondary and unspecified malignant neoplasm of lymph node, unspecified: Secondary | ICD-10-CM | POA: Diagnosis present

## 2015-02-24 LAB — COMPREHENSIVE METABOLIC PANEL
ALT: 190 U/L — AB (ref 14–54)
ANION GAP: 10 (ref 5–15)
AST: 180 U/L — ABNORMAL HIGH (ref 15–41)
Albumin: 3.3 g/dL — ABNORMAL LOW (ref 3.5–5.0)
Alkaline Phosphatase: 366 U/L — ABNORMAL HIGH (ref 38–126)
BUN: 14 mg/dL (ref 6–20)
CHLORIDE: 99 mmol/L — AB (ref 101–111)
CO2: 27 mmol/L (ref 22–32)
CREATININE: 0.4 mg/dL — AB (ref 0.44–1.00)
Calcium: 8.4 mg/dL — ABNORMAL LOW (ref 8.9–10.3)
Glucose, Bld: 267 mg/dL — ABNORMAL HIGH (ref 65–99)
Potassium: 3.8 mmol/L (ref 3.5–5.1)
SODIUM: 136 mmol/L (ref 135–145)
Total Bilirubin: 7.1 mg/dL — ABNORMAL HIGH (ref 0.3–1.2)
Total Protein: 6.1 g/dL — ABNORMAL LOW (ref 6.5–8.1)

## 2015-02-24 LAB — CBC WITH DIFFERENTIAL/PLATELET
BASOS ABS: 0.1 10*3/uL (ref 0.0–0.1)
Basophils Relative: 1 %
EOS ABS: 0.1 10*3/uL (ref 0.0–0.7)
EOS PCT: 2 %
HCT: 38.1 % (ref 36.0–46.0)
HEMOGLOBIN: 12.5 g/dL (ref 12.0–15.0)
LYMPHS PCT: 12 %
Lymphs Abs: 1 10*3/uL (ref 0.7–4.0)
MCH: 29.1 pg (ref 26.0–34.0)
MCHC: 32.8 g/dL (ref 30.0–36.0)
MCV: 88.6 fL (ref 78.0–100.0)
Monocytes Absolute: 1 10*3/uL (ref 0.1–1.0)
Monocytes Relative: 12 %
NEUTROS PCT: 73 %
Neutro Abs: 6.3 10*3/uL (ref 1.7–7.7)
PLATELETS: 187 10*3/uL (ref 150–400)
RBC: 4.3 MIL/uL (ref 3.87–5.11)
RDW: 17.6 % — ABNORMAL HIGH (ref 11.5–15.5)
WBC: 8.5 10*3/uL (ref 4.0–10.5)

## 2015-02-24 LAB — PROTIME-INR
INR: 8 (ref 0.00–1.49)
Prothrombin Time: 64 seconds — ABNORMAL HIGH (ref 11.6–15.2)

## 2015-02-24 LAB — LIPASE, BLOOD: LIPASE: 46 U/L (ref 22–51)

## 2015-02-24 LAB — URINE MICROSCOPIC-ADD ON

## 2015-02-24 LAB — CBG MONITORING, ED: Glucose-Capillary: 264 mg/dL — ABNORMAL HIGH (ref 65–99)

## 2015-02-24 LAB — URINALYSIS, ROUTINE W REFLEX MICROSCOPIC
Glucose, UA: NEGATIVE mg/dL
Hgb urine dipstick: NEGATIVE
KETONES UR: 40 mg/dL — AB
NITRITE: POSITIVE — AB
PROTEIN: 30 mg/dL — AB
Specific Gravity, Urine: 1.031 — ABNORMAL HIGH (ref 1.005–1.030)
Urobilinogen, UA: 1 mg/dL (ref 0.0–1.0)
pH: 6 (ref 5.0–8.0)

## 2015-02-24 LAB — APTT: aPTT: 48 seconds — ABNORMAL HIGH (ref 24–37)

## 2015-02-24 LAB — GLUCOSE, CAPILLARY
GLUCOSE-CAPILLARY: 373 mg/dL — AB (ref 65–99)
GLUCOSE-CAPILLARY: 207 mg/dL — AB (ref 65–99)

## 2015-02-24 MED ORDER — INSULIN ASPART 100 UNIT/ML ~~LOC~~ SOLN
0.0000 [IU] | Freq: Every day | SUBCUTANEOUS | Status: DC
  Administered 2015-02-26: 2 [IU] via SUBCUTANEOUS

## 2015-02-24 MED ORDER — HYDROMORPHONE HCL 1 MG/ML IJ SOLN: 1.0000 mg | INTRAMUSCULAR | Status: DC | PRN

## 2015-02-24 MED ORDER — FLUOXETINE HCL 20 MG PO CAPS
20.0000 mg | ORAL_CAPSULE | Freq: Every morning | ORAL | Status: DC
  Administered 2015-02-25 – 2015-03-01 (×5): 20 mg via ORAL
  Filled 2015-02-24 (×5): qty 1

## 2015-02-24 MED ORDER — ONDANSETRON HCL 4 MG/2ML IJ SOLN
4.0000 mg | Freq: Three times a day (TID) | INTRAMUSCULAR | Status: DC
  Administered 2015-02-24 – 2015-02-26 (×6): 4 mg via INTRAVENOUS
  Filled 2015-02-24 (×6): qty 2

## 2015-02-24 MED ORDER — IOHEXOL 300 MG/ML  SOLN
100.0000 mL | Freq: Once | INTRAMUSCULAR | Status: AC | PRN
  Administered 2015-02-24: 100 mL via INTRAVENOUS

## 2015-02-24 MED ORDER — BISACODYL 10 MG RE SUPP: 10.0000 mg | Freq: Every day | RECTAL | Status: DC | PRN

## 2015-02-24 MED ORDER — HYDROMORPHONE HCL 1 MG/ML IJ SOLN
1.0000 mg | INTRAMUSCULAR | Status: DC | PRN
  Administered 2015-02-25 – 2015-02-28 (×6): 1 mg via INTRAVENOUS
  Filled 2015-02-24 (×6): qty 1

## 2015-02-24 MED ORDER — ONDANSETRON HCL 4 MG/2ML IJ SOLN
4.0000 mg | Freq: Once | INTRAMUSCULAR | Status: AC
  Administered 2015-02-24: 4 mg via INTRAVENOUS
  Filled 2015-02-24: qty 2

## 2015-02-24 MED ORDER — ADULT MULTIVITAMIN W/MINERALS CH
ORAL_TABLET | Freq: Every morning | ORAL | Status: DC
  Administered 2015-02-25 – 2015-03-01 (×5): 1 via ORAL
  Filled 2015-02-24 (×5): qty 1

## 2015-02-24 MED ORDER — SODIUM CHLORIDE 0.9 % IV SOLN
INTRAVENOUS | Status: DC
  Administered 2015-02-24: 04:00:00 via INTRAVENOUS

## 2015-02-24 MED ORDER — ALPRAZOLAM 0.5 MG PO TABS
0.5000 mg | ORAL_TABLET | Freq: Three times a day (TID) | ORAL | Status: DC
  Administered 2015-02-24 – 2015-02-25 (×4): 0.5 mg via ORAL
  Filled 2015-02-24 (×5): qty 1

## 2015-02-24 MED ORDER — VALSARTAN-HYDROCHLOROTHIAZIDE 320-12.5 MG PO TABS: 1.0000 | ORAL_TABLET | Freq: Every morning | ORAL | Status: DC

## 2015-02-24 MED ORDER — SODIUM CHLORIDE 0.9 % IV SOLN
3.0000 g | Freq: Three times a day (TID) | INTRAVENOUS | Status: DC
  Administered 2015-02-24 – 2015-02-26 (×6): 3 g via INTRAVENOUS
  Filled 2015-02-24 (×7): qty 3

## 2015-02-24 MED ORDER — IOHEXOL 300 MG/ML  SOLN
50.0000 mL | Freq: Once | INTRAMUSCULAR | Status: AC | PRN
  Administered 2015-02-24: 50 mL via ORAL

## 2015-02-24 MED ORDER — IRBESARTAN 150 MG PO TABS: 300.0000 mg | ORAL_TABLET | Freq: Every day | ORAL | Status: DC

## 2015-02-24 MED ORDER — FENTANYL CITRATE (PF) 100 MCG/2ML IJ SOLN
100.0000 ug | Freq: Once | INTRAMUSCULAR | Status: AC
  Administered 2015-02-24: 100 ug via INTRAVENOUS
  Filled 2015-02-24: qty 2

## 2015-02-24 MED ORDER — ONDANSETRON HCL 4 MG/2ML IJ SOLN
4.0000 mg | Freq: Four times a day (QID) | INTRAMUSCULAR | Status: DC | PRN
  Administered 2015-02-27 – 2015-03-01 (×2): 4 mg via INTRAVENOUS
  Filled 2015-02-24 (×2): qty 2

## 2015-02-24 MED ORDER — INSULIN GLARGINE 100 UNIT/ML ~~LOC~~ SOLN
30.0000 [IU] | Freq: Every day | SUBCUTANEOUS | Status: DC
  Administered 2015-02-24: 30 [IU] via SUBCUTANEOUS
  Filled 2015-02-24 (×3): qty 0.3

## 2015-02-24 MED ORDER — AMLODIPINE BESYLATE 5 MG PO TABS
5.0000 mg | ORAL_TABLET | Freq: Every morning | ORAL | Status: DC
  Administered 2015-02-25 – 2015-02-27 (×3): 5 mg via ORAL
  Filled 2015-02-24 (×3): qty 1

## 2015-02-24 MED ORDER — TRAZODONE HCL 50 MG PO TABS: 25.0000 mg | ORAL_TABLET | Freq: Every evening | ORAL | Status: DC | PRN

## 2015-02-24 MED ORDER — ONDANSETRON HCL 4 MG PO TABS
4.0000 mg | ORAL_TABLET | Freq: Four times a day (QID) | ORAL | Status: DC | PRN
  Administered 2015-03-01: 4 mg via ORAL
  Filled 2015-02-24: qty 1

## 2015-02-24 MED ORDER — LORAZEPAM 2 MG/ML IJ SOLN: 0.5000 mg | INTRAMUSCULAR | Status: DC | PRN

## 2015-02-24 MED ORDER — INSULIN ASPART 100 UNIT/ML ~~LOC~~ SOLN
10.0000 [IU] | Freq: Three times a day (TID) | SUBCUTANEOUS | Status: DC
  Administered 2015-02-24 – 2015-02-25 (×3): 10 [IU] via SUBCUTANEOUS

## 2015-02-24 MED ORDER — PHYTONADIONE 5 MG PO TABS
10.0000 mg | ORAL_TABLET | Freq: Once | ORAL | Status: AC
  Administered 2015-02-24: 10 mg via ORAL
  Filled 2015-02-24: qty 2

## 2015-02-24 MED ORDER — ONDANSETRON HCL 4 MG/2ML IJ SOLN: 4.0000 mg | Freq: Three times a day (TID) | INTRAMUSCULAR | Status: DC | PRN

## 2015-02-24 MED ORDER — OXYCODONE HCL 5 MG PO TABS
5.0000 mg | ORAL_TABLET | ORAL | Status: DC | PRN
Start: 2015-02-24 — End: 2015-02-26
  Administered 2015-02-24 – 2015-02-26 (×7): 5 mg via ORAL
  Filled 2015-02-24 (×8): qty 1

## 2015-02-24 MED ORDER — SODIUM CHLORIDE 0.9 % IV SOLN
INTRAVENOUS | Status: DC
  Administered 2015-02-24 – 2015-02-26 (×3): via INTRAVENOUS

## 2015-02-24 MED ORDER — HYDROCHLOROTHIAZIDE 12.5 MG PO CAPS: 12.5000 mg | ORAL_CAPSULE | Freq: Every day | ORAL | Status: DC

## 2015-02-24 MED ORDER — INSULIN ASPART 100 UNIT/ML ~~LOC~~ SOLN
0.0000 [IU] | Freq: Three times a day (TID) | SUBCUTANEOUS | Status: DC
  Administered 2015-02-24: 9 [IU] via SUBCUTANEOUS
  Administered 2015-02-25: 3 [IU] via SUBCUTANEOUS
  Administered 2015-02-25: 5 [IU] via SUBCUTANEOUS
  Administered 2015-02-25: 2 [IU] via SUBCUTANEOUS
  Administered 2015-02-26 (×3): 3 [IU] via SUBCUTANEOUS
  Administered 2015-02-27 – 2015-03-01 (×5): 2 [IU] via SUBCUTANEOUS

## 2015-02-24 NOTE — ED Notes (Signed)
Pt waiting on CT

## 2015-02-24 NOTE — ED Provider Notes (Signed)
CSN: [id number]     Arrival date & time 02/24/15  0330 History   First MD Initiated Contact with Patient 02/24/15 0350     Chief Complaint  Patient presents with  . Emesis     (Consider location/radiation/quality/duration/timing/severity/associated sxs/prior Treatment) HPI   Amber Bishop is a 64 y.o. female   Past Medical History  Diagnosis Date  . Anxiety   . Hypertension   . IBS (irritable bowel syndrome)   . Hyperplastic colon polyp   . Allergy   . Anemia   . Arthritis   . Diverticulosis   . Dilated aortic root ([place])     seen on prior echo but echo 05/2013 showed normal dimensions  . Bicuspid aortic valve   . LVE (left ventricular enlargement)     mild by echo 1/15 with EF 50-55%  . pancreatic ca w/ liver mets dx'd 07/2014  . Diabetes mellitus without complication [facility])    Past Surgical History  Procedure Laterality Date  . Tubal ligation      age 15  . Colonoscopy    . Polypectomy    . Tooth extraction      3 teeth  . Belpharoptosis repair     Family History  Problem Relation Age of Onset  . Colon cancer Neg Hx   . Esophageal cancer Neg Hx   . Stomach cancer Neg Hx   . Asthma Mother    Social History  Substance Use Topics  . Smoking status: Never Smoker   . Smokeless tobacco: Never Used  . Alcohol Use: No   OB History    No data available     Review of Systems    Allergies  Naprosyn and Metformin and related  Home Medications   Prior to Admission medications   Medication Sig Start Date End Date Taking? Authorizing Provider  ALPRAZolam Duanne Moron) 0.5 MG tablet TAKE 1 TABLET THREE TIMES DAILY IF NEEDED FOR ANXIETY 02/17/15   Gatha Mayer, MD  amLODipine (NORVASC) 5 MG tablet Take 5 mg by mouth every morning.  03/16/14   Historical Provider, MD  blood glucose meter kit and supplies KIT Dispense based on patient and insurance preference. Use up to four times daily as directed. Dx code: E11.65 07/15/14   Philemon Kingdom, MD  Calcium  Carbonate-Vitamin D (CALCIUM 600/VITAMIN D) 600-400 MG-UNIT per tablet Take 1 tablet by mouth 2 (two) times daily.     Historical Provider, MD  cetirizine (ZYRTEC) 10 MG tablet Take 10 mg by mouth daily.    Historical Provider, MD  cholestyramine Lucrezia Starch) 4 G packet Take 1/2-1 packet dissolved in water/juice once daily....................Marland KitchenPRN Patient taking differently: Take 2-4 g by mouth daily as needed (IBS).  01/24/11   Lafayette Dragon, MD  dextromethorphan (DELSYM) 30 MG/5ML liquid Take 15 mg by mouth 2 (two) times daily as needed for cough.    Historical Provider, MD  diphenhydrAMINE-zinc acetate (BENADRYL) cream Apply topically 2 (two) times daily as needed for itching. 11/15/14   Hosie Poisson, MD  FLUoxetine (PROZAC) 20 MG capsule Take 20 mg by mouth every morning.     Historical Provider, MD  FLUZONE HIGH-DOSE 0.5 ML SUSY inject 0.5 milliliter intramuscularly 01/23/15   Historical Provider, MD  glucose blood (ACCU-CHEK AVIVA PLUS) test strip Test 4 times daily as instructed. 06/28/14   Philemon Kingdom, MD  insulin aspart (NOVOLOG FLEXPEN) 100 UNIT/ML FlexPen Inject 14-18 units into the skin 3 times daily as instructed. Patient taking differently: Inject 24-28 Units into  the skin 3 (three) times daily with meals.  07/30/14   Philemon Kingdom, MD  insulin glargine (LANTUS) 100 UNIT/ML injection Inject 74 Units into the skin at bedtime.     Historical Provider, MD  Insulin Pen Needle (VALUMARK PEN NEEDLES) 31G X 8 MM MISC Use to inject insulin 4 times daily as instructed. 03/18/14   Philemon Kingdom, MD  LANCETS ULTRA THIN 30G MISC Use to test blood sugar 4 times daily as instructed. Dx code: E11.65 07/15/14   Philemon Kingdom, MD  lidocaine-prilocaine (EMLA) cream Apply 1 application topically as needed. 12/15/14   Truitt Merle, MD  Multiple Vitamins-Minerals (CENTRUM SILVER PO) Take 1 capsule by mouth every morning.     Historical Provider, MD  Omega-3 Fatty Acids (FISH OIL) 1000 MG CAPS Take 1 capsule by  mouth every morning.     Historical Provider, MD  OVER THE COUNTER MEDICATION Apply 1 drop topically 3 (three) times daily. Essential Oils.    Historical Provider, MD  valsartan-hydrochlorothiazide (DIOVAN-HCT) 320-12.5 MG per tablet Take 1 tablet by mouth every morning.  03/16/14   Historical Provider, MD  warfarin (COUMADIN) 7.5 MG tablet One tab daily except 1.5 tab on Wednesday and Friday 01/04/15   Truitt Merle, MD   There were no vitals taken for this visit. Physical Exam  ED Course  Procedures (including critical care time) Labs Review Labs Reviewed - No data to display  Imaging Review No results found. I have personally reviewed and evaluated these images and lab results as part of my medical decision-making.   EKG Interpretation None      MDM   Final diagnoses:  Epigastric pain  Malignant neoplasm of pancreas, unspecified location of malignancy (HCC)    Nonspecific upper abdominal pain, with history of pancreatic cancer, currently under treatment. Patient was already scheduled for CT imaging, today, so as images were ordered in the emergency department to evaluate her pain.  Nursing Notes Reviewed/ Care Coordinated, and agree without changes. Applicable Imaging Reviewed.  Interpretation of Laboratory Data incorporated into ED treatment  06:40- care to Dr. Vanita Panda to evaluate after return of imaging, and reassess the patient.    Daleen Bo, MD 02/25/15 [phone]

## 2015-02-24 NOTE — Progress Notes (Signed)
This encounter was created in error - please disregard.

## 2015-02-24 NOTE — Progress Notes (Signed)
ANTIBIOTIC CONSULT NOTE - INITIAL  Pharmacy Consult for Unasyn Indication: intra-abdominal infection  Allergies  Allergen Reactions  . Naprosyn [Naproxen] Nausea And Vomiting    Very nauseated but no vomiting.  . Metformin And Related Diarrhea    Pt has IBS     Patient Measurements: Height: 5\' 4"  (162.6 cm) Weight: 214 lb (97.07 kg) IBW/kg (Calculated) : 54.7  Vital Signs: Temp: 98.6 F (37 C) (10/06 1218) Temp Source: Oral (10/06 1218) BP: 147/82 mmHg (10/06 1218) Pulse Rate: 89 (10/06 1218) Intake/Output from previous day:   Intake/Output from this shift:    Labs:  Recent Labs  02/24/15 0423  WBC 8.5  HGB 12.5  PLT 187  CREATININE 0.40*   Estimated Creatinine Clearance: 78.3 mL/min (by C-G formula based on Cr of 0.4). No results for input(s): VANCOTROUGH, VANCOPEAK, VANCORANDOM, GENTTROUGH, GENTPEAK, GENTRANDOM, TOBRATROUGH, TOBRAPEAK, TOBRARND, AMIKACINPEAK, AMIKACINTROU, AMIKACIN in the last 72 hours.   Microbiology: No results found for this or any previous visit (from the past 720 hour(s)).  Medical History: Past Medical History  Diagnosis Date  . Anxiety   . Hypertension   . IBS (irritable bowel syndrome)   . Hyperplastic colon polyp   . Allergy   . Anemia   . Arthritis   . Diverticulosis   . Dilated aortic root ([place])     seen on prior echo but echo 05/2013 showed normal dimensions  . Bicuspid aortic valve   . LVE (left ventricular enlargement)     mild by echo 1/15 with EF 50-55%  . pancreatic ca w/ liver mets dx'd 07/2014  . Diabetes mellitus without complication ([facility])     Medications:  Anti-infectives    Start     Dose/Rate Route Frequency Ordered Stop   02/24/15 1400  Ampicillin-Sulbactam (UNASYN) 3 g in sodium chloride 0.9 % 100 mL IVPB     3 g 100 mL/hr over 60 Minutes Intravenous Every 8 hours 02/24/15 1227       Assessment: 64yo F w/ metastatic pancreatic cancer presented to the ED w/ abdominal pain and N/V.  Pharmacy is asked to  dose Unasyn for possible intra-abdominal infection. SCr is <1, CrCl ~29ml/min.   Goal of Therapy:  Appropriate antibiotic dosing for renal function; eradication of infection  Plan:  Unasyn 3g IV q8h. Follow up renal fxn, culture results, and clinical course.  Romeo Rabon, PharmD, pager [phone]. 02/24/2015,12:31 PM.

## 2015-02-24 NOTE — Progress Notes (Addendum)
ANTICOAGULATION CONSULT NOTE - Initial Consult  Pharmacy Consult for heparin Indication: DVT  Allergies  Allergen Reactions  . Naprosyn [Naproxen] Nausea And Vomiting    Very nauseated but no vomiting.  . Metformin And Related Diarrhea    Pt has IBS     Patient Measurements: Height: 5\' 4"  (162.6 cm) Weight: 214 lb (97.07 kg) IBW/kg (Calculated) : 54.7 Heparin Dosing Weight: 81kg  Vital Signs: Temp: 98.6 F (37 C) (10/06 1218) Temp Source: Oral (10/06 1218) BP: 147/82 mmHg (10/06 1218) Pulse Rate: 89 (10/06 1218)  Labs:  Recent Labs  02/24/15 0423 02/24/15 1255  HGB 12.5  --   HCT 38.1  --   PLT 187  --   APTT  --  48*  LABPROT  --  64.0*  INR  --  8.00*  CREATININE 0.40*  --     Estimated Creatinine Clearance: 78.3 mL/min (by C-G formula based on Cr of 0.4).   Medical History: Past Medical History  Diagnosis Date  . Anxiety   . Hypertension   . IBS (irritable bowel syndrome)   . Hyperplastic colon polyp   . Allergy   . Anemia   . Arthritis   . Diverticulosis   . Dilated aortic root ([place])     seen on prior echo but echo 05/2013 showed normal dimensions  . Bicuspid aortic valve   . LVE (left ventricular enlargement)     mild by echo 1/15 with EF 50-55%  . pancreatic ca w/ liver mets dx'd 07/2014  . Diabetes mellitus without complication [facility])     Assessment: Amber Bishop F w/ metastatic pancreatic cancer presented to the ED w/ abdominal pain and N/V. She takes warfarin for a RLE DVT. MD note states she also has a history of chronic thrombocytopenia but platelets are in the normal range on admission. Warfarin was not resumed on admission and pharmacy is asked to dose heparin. INR = 8. No bleeding reported/documented.  Goal of Therapy:  Heparin level 0.3-0.7 units/ml Monitor platelets by anticoagulation protocol: Yes   Plan:  No heparin for now.  Vitamin K 10mg  PO has been ordered. F/u INR daily and start heparin when it is >2 unless otherwise instructed.    Romeo Rabon, PharmD, pager [phone]. 02/24/2015,1:57 PM.

## 2015-02-24 NOTE — ED Notes (Signed)
Pt drinking contrast. 

## 2015-02-24 NOTE — ED Notes (Signed)
Pt took her normal morning meds from home. MD is aware.

## 2015-02-24 NOTE — H&P (Signed)
[facility] History and Physical  Amber Bishop TXM:[id number] DOB: 1950/11/17 DOA: 02/24/2015  Referring physician:  Carmin Muskrat PCP:  [name], MD   Chief Complaint:  Nausea, vomiting  HPI:  The patient is a 64 y.o. year-old female with history of pancreatic cancer followed by Dr. Burr Medico, hypertension, anxiety, diabetes mellitus type 2 who presents with nausea and vomiting.  She was diagnosed with pancreatic cancer after she had a CT scan which demonstrated of pancreatic mass in March 2016. She completed 4 cycles of mFOLFOX but had progression of disease so she was started on second line chemotherapy with gemcitabine and Abraxane in June 2016. In the same month, she had a 1-1/2 week hospitalization for right lower extremity DVT and she was started on Coumadin.  Last month, she was seen by her oncologist and had a CT scan which demonstrated stable disease. She states that over the last 1-2 months, she has had an 8 pound weight loss.  She was having some fevers, chills, and shakes with her chemotherapy but she has not received chemotherapy in the last 2 weeks and has not had these symptoms. She initially denied any recent pain, but then later stated that she has had severe 10 out of 10 back pain radiating from her lower cervical spine down to her upper lumbar area the prevention her from sleeping. She has also had approximate 2 months of progressive epigastric abdominal pain that radiates to the right and left upper quadrants which comes and goes and is 10 out of 10 in severity when it occurs.  She is unsure if there is anything that triggers her pain. She presented to the hospital today because she developed some nausea with 3 episodes of emesis since yesterday. Her emesis is nonbloody, nonbilious. She has had some weakness.  She denies diarrhea, and her last vomiting was within the last 24 hours.  She called the cancer center nurse who advised her to come to the emergency department. Of  note, her INR has been drifting up over the last couple of weeks despite reductions in her Coumadin dose.  In the emergency department, her vital signs were stable except for mildly elevated blood pressure of 147/82. Her labs were concerning for transaminitis with elevated total bilirubin. Her AST was 180, ALT 190, alkaline phosphatase 366, total bilirubin 7.1. Her last bilirubin was done on 9/29 was 0.97. Her CBC was within normal limits.  Her INR was 8.  She underwent CT scan of the abdomen and pelvis and chest which demonstrated numerous enlarging pulmonary nodules, enlarging hepatic metastases, new developing biliary ductal dilation of the central anterior and posterior segmental bile ducts in the right biliary tree, evidence of peritoneal car Simmons ptosis with small volume ascites, new bony metastases. She appears to have progression of disease now with obstruction of biliary ducts.  Review of Systems:  General:  Denies fevers, chills, 8 pound weight loss HEENT:  Denies changes to hearing and vision, rhinorrhea, sinus congestion, sore throat CV:  Denies chest pain and palpitations, lower extremity edema.  PULM:  Denies SOB, wheezing, cough.   GI: Per history of present illness   GU:  Denies dysuria, frequency, urgency ENDO:  Denies polyuria, polydipsia.   HEME:  Denies hematemesis, blood in stools, melena, abnormal bruising or bleeding.  LYMPH:  Denies lymphadenopathy.   MSK:  Denies arthralgias, myalgias.   DERM:  Denies skin rash or ulcer.   NEURO:  Denies focal numbness, weakness, slurred speech, confusion, facial droop.  PSYCH:  Denies anxiety and depression.    Past Medical History  Diagnosis Date  . Anxiety   . Hypertension   . IBS (irritable bowel syndrome)   . Hyperplastic colon polyp   . Allergy   . Anemia   . Arthritis   . Diverticulosis   . Dilated aortic root ([place])     seen on prior echo but echo 05/2013 showed normal dimensions  . Bicuspid aortic valve   . LVE (left  ventricular enlargement)     mild by echo 1/15 with EF 50-55%  . pancreatic ca w/ liver mets dx'd 07/2014  . Diabetes mellitus without complication [facility])    Past Surgical History  Procedure Laterality Date  . Tubal ligation      age 85  . Colonoscopy    . Polypectomy    . Tooth extraction      3 teeth  . Belpharoptosis repair     Social History:  reports that she has never smoked. She has never used smokeless tobacco. She reports that she does not drink alcohol or use illicit drugs.  Lives with her boyfriend  Allergies  Allergen Reactions  . Naprosyn [Naproxen] Nausea And Vomiting    Very nauseated but no vomiting.  . Metformin And Related Diarrhea    Pt has IBS     Family History  Problem Relation Age of Onset  . Colon cancer Neg Hx   . Esophageal cancer Neg Hx   . Stomach cancer Neg Hx   . Asthma Mother      Prior to Admission medications   Medication Sig Start Date End Date Taking? Authorizing Provider  ALPRAZolam (XANAX) 0.5 MG tablet TAKE 1 TABLET THREE TIMES DAILY IF NEEDED FOR ANXIETY Patient taking differently: Take 0.5 mg by mouth 3 (three) times daily as needed for anxiety.  02/17/15  Yes Gatha Mayer, MD  amLODipine (NORVASC) 5 MG tablet Take 5 mg by mouth every morning.  03/16/14  Yes Historical Provider, MD  Calcium Carbonate-Vitamin D (CALCIUM 600/VITAMIN D) 600-400 MG-UNIT per tablet Take 1 tablet by mouth 2 (two) times daily.    Yes Historical Provider, MD  cetirizine (ZYRTEC) 10 MG tablet Take 10 mg by mouth daily.   Yes Historical Provider, MD  cholestyramine Lucrezia Starch) 4 G packet Take 1/2-1 packet dissolved in water/juice once daily....................Marland KitchenPRN Patient taking differently: Take 2-4 g by mouth daily as needed (IBS).  01/24/11  Yes Lafayette Dragon, MD  FLUoxetine (PROZAC) 20 MG capsule Take 20 mg by mouth every morning.    Yes Historical Provider, MD  insulin aspart (NOVOLOG FLEXPEN) 100 UNIT/ML FlexPen Inject 14-18 units into the skin 3 times daily  as instructed. Patient taking differently: Inject 34-38 Units into the skin 3 (three) times daily with meals.  07/30/14  Yes Philemon Kingdom, MD  insulin glargine (LANTUS) 100 UNIT/ML injection Inject 60 Units into the skin at bedtime.    Yes Historical Provider, MD  lidocaine-prilocaine (EMLA) cream Apply 1 application topically as needed. Patient taking differently: Apply 1 application topically as needed (port).  12/15/14  Yes Truitt Merle, MD  Multiple Vitamins-Minerals (CENTRUM SILVER PO) Take 1 capsule by mouth every morning.    Yes Historical Provider, MD  Omega-3 Fatty Acids (FISH OIL) 1000 MG CAPS Take 1 capsule by mouth every morning.    Yes Historical Provider, MD  OVER THE COUNTER MEDICATION Apply 1 drop topically 3 (three) times daily. Essential Oils.   Yes Historical Provider, MD  valsartan-hydrochlorothiazide (DIOVAN-HCT)  320-12.5 MG per tablet Take 1 tablet by mouth every morning.  03/16/14  Yes Historical Provider, MD  blood glucose meter kit and supplies KIT Dispense based on patient and insurance preference. Use up to four times daily as directed. Dx code: E11.65 07/15/14   Philemon Kingdom, MD  FLUZONE HIGH-DOSE 0.5 ML SUSY inject 0.5 milliliter intramuscularly 01/23/15   Historical Provider, MD  glucose blood (ACCU-CHEK AVIVA PLUS) test strip Test 4 times daily as instructed. 06/28/14   Philemon Kingdom, MD  Insulin Pen Needle (VALUMARK PEN NEEDLES) 31G X 8 MM MISC Use to inject insulin 4 times daily as instructed. 03/18/14   Philemon Kingdom, MD  LANCETS ULTRA THIN 30G MISC Use to test blood sugar 4 times daily as instructed. Dx code: E11.65 07/15/14   Philemon Kingdom, MD   Physical Exam: Filed Vitals:   02/24/15 0350 02/24/15 0745 02/24/15 1123 02/24/15 1218  BP: 130/79 144/78 127/82 147/82  Pulse: 95 81 94 89  Temp: 98.2 F (36.8 C)   98.6 F (37 C)  TempSrc: Oral   Oral  Resp: 18 18 18 18   Height:    5' 4"  (1.626 m)  Weight:    97.07 kg (214 lb)  SpO2: 96% 98% 96% 100%      General:  Adult female, no acute distress  Eyes:  PERRL, mild scleral icterus, non-injected.  ENT:  Nares clear.  OP clear, non-erythematous without plaques or exudates.  MMM.  Neck:  Supple without TM or JVD.    Lymph:  No cervical, supraclavicular, or submandibular LAD.  Cardiovascular:  RRR, normal S1, S2, without m/r/g.  2+ pulses, warm extremities  Respiratory:  CTA bilaterally without increased WOB.  Abdomen:  NABS.  Soft, moderately distended, tender to palpation in the right upper quadrant and epigastric area without rebound or guarding   Skin:  No rashes or focal lesions.  Musculoskeletal:  Normal bulk and tone.  No LE edema.  Psychiatric:  A & O x 4.  Appropriate affect. Mildly pressured speech    Neurologic:  CN 3-12 intact.  5/5 strength.  Sensation intact.  Labs on Admission:  Basic Metabolic Panel:  Recent Labs Lab 02/24/15 0423  NA 136  K 3.8  CL 99*  CO2 27  GLUCOSE 267*  BUN 14  CREATININE 0.40*  CALCIUM 8.4*   Liver Function Tests:  Recent Labs Lab 02/24/15 0423  AST 180*  ALT 190*  ALKPHOS 366*  BILITOT 7.1*  PROT 6.1*  ALBUMIN 3.3*    Recent Labs Lab 02/24/15 0423  LIPASE 46   No results for input(s): AMMONIA in the last 168 hours. CBC:  Recent Labs Lab 02/24/15 0423  WBC 8.5  NEUTROABS 6.3  HGB 12.5  HCT 38.1  MCV 88.6  PLT 187   Cardiac Enzymes: No results for input(s): CKTOTAL, CKMB, CKMBINDEX, TROPONINI in the last 168 hours.  BNP (last 3 results) No results for input(s): BNP in the last 8760 hours.  ProBNP (last 3 results) No results for input(s): PROBNP in the last 8760 hours.  CBG:  Recent Labs Lab 02/24/15 0755 02/24/15 1306  GLUCAP 264* 373*    Radiological Exams on Admission: Ct Chest W Contrast  02/24/2015   CLINICAL DATA:  64 year old female with known metastatic pancreatic cancer and new symptoms of abdominal pain, nausea and vomiting  EXAM: CT CHEST, ABDOMEN, AND PELVIS WITH CONTRAST   TECHNIQUE: Multidetector CT imaging of the chest, abdomen and pelvis was performed following the standard protocol during bolus  administration of intravenous contrast.  CONTRAST:  194m OMNIPAQUE IOHEXOL 300 MG/ML  SOLN  COMPARISON:  Most recent CT abdomen/pelvis 12/27/2014; prior CT scan of the chest 10/26/2014  FINDINGS: CT CHEST FINDINGS  Mediastinum/Nodes: Unremarkable CT appearance of the thyroid gland. No suspicious mediastinal or hilar adenopathy. No soft tissue mediastinal mass. The thoracic esophagus is unremarkable.  Lungs/Pleura: Scattered foci of ground-glass attenuation opacity in the right upper lobe and along the major fissure are similar to slightly more conspicuous than previously seen. There are 2 subpleural nodules within the major fissure which were previously barely detectable but have since enlarged measuring up to 4 mm. No definite new intraparenchymal pulmonary nodule. No new airspace consolidation, pulmonary edema, pleural effusion or pneumothorax. Right IJ approach single-lumen power injectable port catheter. Catheter tip terminates at the superior cavoatrial junction. No evidence of aneurysm. The heart is within normal limits for size. No pericardial effusion.  Musculoskeletal: Stable sclerotic focus in the superior endplate of T4. The sclerotic focus in the lateral aspect of the L1 vertebral body has enlarged compared 12/27/2014. The lesion now measures up to 1.5 cm compared to 1.0 cm previously. No acute fracture.  CT ABDOMEN PELVIS FINDINGS  Hepatobiliary: Similar degree of left-sided biliary ductal dilatation. The segments 2 and 3 biliary ducts appear obstructed by an a amorphous hepatic metastasis. Newly developing central biliary ductal dilatation in the anterior and posterior segmental ducts of the right hepatic lobe. This was not previously present. There are innumerable hepatic lesions bilaterally which are enlarging. An index lesion within the right hepatic lobe has increased  from approximately 1.8 cm to 2.5 cm. A second index lesion in the anterior aspect of hepatic segment 5 now measures 2.7 cm compared to 2.1 cm previously. A lesion in the deep aspect of hepatic segment 4B is similar at 2.8 cm compared to 2.7 cm previously. A lesion in the periphery of the liver at the interface of segment 5 and 6 now measures 3.5 cm compared to 2.3 cm. Multiple other lesions also demonstrate interval enlargement.  Interval development of mild perihepatic ascites. The gallbladder is decompressed.  Pancreas: An amorphous slightly hypoenhancing mass in the body of the pancreas is less well-defined than on prior imaging and difficult to measure. The mass measures approximately 4.8 x 3.9 cm compared to 5.0 x 3.1 cm. It appears slightly more full. The pancreatic tail distal to this lesion is atrophic with mild ductal dilatation. This is similar compared to prior.  Spleen: No focal splenic abnormality.  Trace perisplenic ascites.  Adrenals/Urinary Tract: The adrenal glands are within normal limits. No enhancing renal mass, hydronephrosis or nephrolithiasis. Stable 5.3 cm cyst exophytic from the lower pole of the right kidney.  Stomach/Bowel: Unremarkable CT appearance of the stomach, duodenum, and intestine. No focal bowel wall thickening or evidence of obstruction. Colonic diverticular disease without CT evidence of active inflammation. Mild stranding is present within the small bowel mesentery and ascending colon mesial colon. This was not previously present.  Vascular/Lymphatic: Enlarging para-aortic lymph nodes. A right celiac station node now measures 1.4 x 0.8 cm compared to being barely detectable at a maximum of 7 mm previously. This node is adjacent to the left adrenal gland on image 59 of series 3.  Reproductive: Unremarkable bladder, uterus and adnexa. Small free fluid within the pelvis.  Other: Subtle enhancing nodularity along the peritoneal surface adjacent to the liver in the right  perihepatic space (images 65, 71, 72, 73 and 75 of series 3).  Musculoskeletal: Stable  mixed lytic and sclerotic lesion in the left femoral head. New sclerotic metastatic lesion in the inferior left ischial tuberosity measuring 7 mm. Slightly enlarged mixed lytic and sclerotic lesion in the posterior aspect of the L2 vertebral body. This is best demonstrated on the sagittal reformatted images were the lesion measures 2 cm. Stable reticulation of the subcutaneous fat with multiple small nodules and focal skin thickening. This is not significantly different compared to prior.  IMPRESSION: 1. There has been interval progression of metastatic disease compared 12/27/2014. Specifically, there has been interval enlargement of numerous hepatic metastatic lesions as well as interval development of small volume ascites and subtle nodularity of the peritoneal surface in the perihepatic space and throughout the mesentery concerning for peritoneal carcinomatosis. Additionally, there is increasing retroperitoneal adenopathy and progressive osseous metastatic disease at L1, L2 and in the left ischial tuberosity. 2. Slightly increased subpleural nodularity along the major fissure of the right lung may represent increasing prominence of subpleural lymph nodes due to an underlying infectious/inflammatory process, or an additional site of progressive metastatic disease. 3. New developing biliary ductal dilatation of the central anterior and posterior segmental bile ducts in the right biliary tree. These were previously normal. The left intrahepatic biliary ductal dilatation remains stable. 4. Similar to slightly enlarged amorphous hypoenhancing mass in the body of the pancreas consistent with the primary pancreatic neoplasm. 5. Colonic diverticular disease without CT evidence of active inflammation. 6. Stable skin thickening with reticulation of the underlying subcutaneous fat and nodularity in the right lower quadrant anterior  abdominal wall. This may represent reactive changes if the patient is undergoing subcutaneous medicine injections. If there are no injections in this region, this could represent a site of cutaneous metastatic disease.   Electronically Signed   By: Jacqulynn Cadet M.D.   On: 02/24/2015 08:14   Ct Abdomen Pelvis W Contrast  02/24/2015   CLINICAL DATA:  64 year old female with known metastatic pancreatic cancer and new symptoms of abdominal pain, nausea and vomiting  EXAM: CT CHEST, ABDOMEN, AND PELVIS WITH CONTRAST  TECHNIQUE: Multidetector CT imaging of the chest, abdomen and pelvis was performed following the standard protocol during bolus administration of intravenous contrast.  CONTRAST:  151m OMNIPAQUE IOHEXOL 300 MG/ML  SOLN  COMPARISON:  Most recent CT abdomen/pelvis 12/27/2014; prior CT scan of the chest 10/26/2014  FINDINGS: CT CHEST FINDINGS  Mediastinum/Nodes: Unremarkable CT appearance of the thyroid gland. No suspicious mediastinal or hilar adenopathy. No soft tissue mediastinal mass. The thoracic esophagus is unremarkable.  Lungs/Pleura: Scattered foci of ground-glass attenuation opacity in the right upper lobe and along the major fissure are similar to slightly more conspicuous than previously seen. There are 2 subpleural nodules within the major fissure which were previously barely detectable but have since enlarged measuring up to 4 mm. No definite new intraparenchymal pulmonary nodule. No new airspace consolidation, pulmonary edema, pleural effusion or pneumothorax. Right IJ approach single-lumen power injectable port catheter. Catheter tip terminates at the superior cavoatrial junction. No evidence of aneurysm. The heart is within normal limits for size. No pericardial effusion.  Musculoskeletal: Stable sclerotic focus in the superior endplate of T4. The sclerotic focus in the lateral aspect of the L1 vertebral body has enlarged compared 12/27/2014. The lesion now measures up to 1.5 cm  compared to 1.0 cm previously. No acute fracture.  CT ABDOMEN PELVIS FINDINGS  Hepatobiliary: Similar degree of left-sided biliary ductal dilatation. The segments 2 and 3 biliary ducts appear obstructed by an a amorphous hepatic metastasis.  Newly developing central biliary ductal dilatation in the anterior and posterior segmental ducts of the right hepatic lobe. This was not previously present. There are innumerable hepatic lesions bilaterally which are enlarging. An index lesion within the right hepatic lobe has increased from approximately 1.8 cm to 2.5 cm. A second index lesion in the anterior aspect of hepatic segment 5 now measures 2.7 cm compared to 2.1 cm previously. A lesion in the deep aspect of hepatic segment 4B is similar at 2.8 cm compared to 2.7 cm previously. A lesion in the periphery of the liver at the interface of segment 5 and 6 now measures 3.5 cm compared to 2.3 cm. Multiple other lesions also demonstrate interval enlargement.  Interval development of mild perihepatic ascites. The gallbladder is decompressed.  Pancreas: An amorphous slightly hypoenhancing mass in the body of the pancreas is less well-defined than on prior imaging and difficult to measure. The mass measures approximately 4.8 x 3.9 cm compared to 5.0 x 3.1 cm. It appears slightly more full. The pancreatic tail distal to this lesion is atrophic with mild ductal dilatation. This is similar compared to prior.  Spleen: No focal splenic abnormality.  Trace perisplenic ascites.  Adrenals/Urinary Tract: The adrenal glands are within normal limits. No enhancing renal mass, hydronephrosis or nephrolithiasis. Stable 5.3 cm cyst exophytic from the lower pole of the right kidney.  Stomach/Bowel: Unremarkable CT appearance of the stomach, duodenum, and intestine. No focal bowel wall thickening or evidence of obstruction. Colonic diverticular disease without CT evidence of active inflammation. Mild stranding is present within the small bowel  mesentery and ascending colon mesial colon. This was not previously present.  Vascular/Lymphatic: Enlarging para-aortic lymph nodes. A right celiac station node now measures 1.4 x 0.8 cm compared to being barely detectable at a maximum of 7 mm previously. This node is adjacent to the left adrenal gland on image 59 of series 3.  Reproductive: Unremarkable bladder, uterus and adnexa. Small free fluid within the pelvis.  Other: Subtle enhancing nodularity along the peritoneal surface adjacent to the liver in the right perihepatic space (images 65, 71, 72, 73 and 75 of series 3).  Musculoskeletal: Stable mixed lytic and sclerotic lesion in the left femoral head. New sclerotic metastatic lesion in the inferior left ischial tuberosity measuring 7 mm. Slightly enlarged mixed lytic and sclerotic lesion in the posterior aspect of the L2 vertebral body. This is best demonstrated on the sagittal reformatted images were the lesion measures 2 cm. Stable reticulation of the subcutaneous fat with multiple small nodules and focal skin thickening. This is not significantly different compared to prior.  IMPRESSION: 1. There has been interval progression of metastatic disease compared 12/27/2014. Specifically, there has been interval enlargement of numerous hepatic metastatic lesions as well as interval development of small volume ascites and subtle nodularity of the peritoneal surface in the perihepatic space and throughout the mesentery concerning for peritoneal carcinomatosis. Additionally, there is increasing retroperitoneal adenopathy and progressive osseous metastatic disease at L1, L2 and in the left ischial tuberosity. 2. Slightly increased subpleural nodularity along the major fissure of the right lung may represent increasing prominence of subpleural lymph nodes due to an underlying infectious/inflammatory process, or an additional site of progressive metastatic disease. 3. New developing biliary ductal dilatation of the  central anterior and posterior segmental bile ducts in the right biliary tree. These were previously normal. The left intrahepatic biliary ductal dilatation remains stable. 4. Similar to slightly enlarged amorphous hypoenhancing mass in the body of the  pancreas consistent with the primary pancreatic neoplasm. 5. Colonic diverticular disease without CT evidence of active inflammation. 6. Stable skin thickening with reticulation of the underlying subcutaneous fat and nodularity in the right lower quadrant anterior abdominal wall. This may represent reactive changes if the patient is undergoing subcutaneous medicine injections. If there are no injections in this region, this could represent a site of cutaneous metastatic disease.   Electronically Signed   By: Jacqulynn Cadet M.D.   On: 02/24/2015 08:14    Assessment/Plan Active Problems:   Diabetes mellitus type 2, uncontrolled, without complications (HCC)   Pancreatic cancer metastasized to liver [facility])   Primary pancreatic cancer with metastasis to other site [facility])   DVT (deep venous thrombosis) (HCC)   Nausea and vomiting   Biliary obstruction  ---  Progressive metastatic pancreatic cancer, failed two lines of chemotherapy.  The patient states that she has an advanced directive that states that she would want a natural death should her prognosis be less than 6 months. We discussed the fact that she has a limited prognosis because of progression of her pancreatic cancer and that she has failed the first 2 lines of chemotherapy. She was in agreement with DO NOT RESUSCITATE at this time. I did not discuss hospice care and deferred the conversation about chemotherapy versus hospice to her oncologist who will be visiting her later today.  -  Spoke with Dr. Burr Medico after she met with the patient. They discussed the possibility of a biliary drain and hospice care. The patient was in some denial regarding her prognosis, which is understandable since her last  visit she was told that she had no progression of disease and today is her birthday and she is being told that her cancer has progressed despite undergoing chemotherapy. -  Palliative care consultation to assist with goals of care  Nausea and vomiting with abdominal pain are likely secondary to biliary obstruction with elevated transaminitis/bilirubin  -  IR consultation  -  Will need to address her elevated INR before any procedures are undertaken  -  Start Unasyn - Antiemetics and IV fluids  -  Start IV Dilaudid when necessary  Elevated INR secondary to Coumadin therapy in the setting of progressive liver metastases -  She is not safe for ongoing Coumadin therapy -  Discontinue Coumadin -  Administer vitamin K 10 mg 1 -  Repeat INR in a.m.  Type 2 diabetes mellitus with hyperglycemia, difficult historian but it appears that she has been taking Lantus 60 units most evenings and that she has been taking NovoLog approximally 30 units before meals over the last week with persistently elevated blood sugars -  Not eating or drinking reliably -  Decrease to Lantus 30 units daily at bedtime -  Start low-dose sliding scale insulin was standing aspart 10 units with meals  Essential hypertension, with elevated blood pressure  -  Hold HCTZ and ARB secondary to dehydration  -  Continue Norvasc   DVT, occurred in June of this year, needs ongoing anticoagulation and she is going be aggressive since this was triggered by underlying malignancy -  Hold anticoagulation for now given elevated INR -  Heparin drip to be started when INR less than 2 prior to her biliary drain placement -  Will likely need Lovenox at discharge depending on goals of care  Depression/anxiety, pressured speech -  Scheduled Xanax per her home schedule, continue fluoxetine -  When necessary Ativan added by palliative care   Diet:Diabetic  diet Access: Port IVF:Yes Proph: Therapeutic INR  Code Status:  DO NOT RESUSCITATE,  conversation witnessed by patient, boyfriend in emergency department. This is consistent with her advanced directives per her report.  Her son is her healthcare power of attorney/next of kin Family Communication:  Patient, her boyfriend Disposition Plan: Admit toMedSurg  Time spent: 60 min Janece Canterbury [facility] Pager [phone]  If 7PM-7AM, please contact night-coverage [email or web address] Password [email or web address] 02/24/2015, 4:42 PM

## 2015-02-24 NOTE — Progress Notes (Signed)
Amber Bishop   DOB:Oct 02, 1950   MV#:[id number]   GGE#:[id number]  Subjective: The patient is well-known to me. She is under my care for her metastatic pancreatic cancer, currently on second line chemotherapy. She presented to emergency room last night with worsening abdominal pain, nausea, and poor appetite. No fever or chills reported at home.   Objective:  Filed Vitals:   02/24/15 2029  BP: 135/80  Pulse: 88  Temp: 98.1 F (36.7 C)  Resp: 18    Body mass index is 36.72 kg/(m^2).  Intake/Output Summary (Last 24 hours) at 02/24/15 2200 Last data filed at 02/24/15 2029  Gross per 24 hour  Intake    350 ml  Output      1 ml  Net    349 ml     Sclerae and skin (+) jaundice   Oropharynx clear  No peripheral adenopathy  Lungs clear -- no rales or rhonchi  Heart regular rate and rhythm  Abdomen benign  MSK no focal spinal tenderness, no peripheral edema  Neuro nonfocal    CBG (last 3)   Recent Labs  02/24/15 0755 02/24/15 1306 02/24/15 2026  GLUCAP 264* 373* 207*     Labs:  Lab Results  Component Value Date   WBC 8.5 02/24/2015   HGB 12.5 02/24/2015   HCT 38.1 02/24/2015   MCV 88.6 02/24/2015   PLT 187 02/24/2015   NEUTROABS 6.3 02/24/2015    @LASTCHEMISTRY @  Urine Studies No results for input(s): UHGB, CRYS in the last 72 hours.  Invalid input(s): UACOL, UAPR, USPG, UPH, UTP, UGL, UKET, UBIL, UNIT, UROB, ULEU, UEPI, UWBC, URBC, UBAC, CAST, UCOM, BILUA  Basic Metabolic Panel:  Recent Labs Lab 02/24/15 0423  NA 136  K 3.8  CL 99*  CO2 27  GLUCOSE 267*  BUN 14  CREATININE 0.40*  CALCIUM 8.4*   GFR Estimated Creatinine Clearance: 78.3 mL/min (by C-G formula based on Cr of 0.4). Liver Function Tests:  Recent Labs Lab 02/24/15 0423  AST 180*  ALT 190*  ALKPHOS 366*  BILITOT 7.1*  PROT 6.1*  ALBUMIN 3.3*    Recent Labs Lab 02/24/15 0423  LIPASE 46   No results for input(s): AMMONIA in the last 168 hours. Coagulation  profile  Recent Labs Lab 02/24/15 1255  INR 8.00*    CBC:  Recent Labs Lab 02/24/15 0423  WBC 8.5  NEUTROABS 6.3  HGB 12.5  HCT 38.1  MCV 88.6  PLT 187   Cardiac Enzymes: No results for input(s): CKTOTAL, CKMB, CKMBINDEX, TROPONINI in the last 168 hours. BNP: Invalid input(s): POCBNP CBG:  Recent Labs Lab 02/24/15 0755 02/24/15 1306 02/24/15 2026  GLUCAP 264* 373* 207*   D-Dimer No results for input(s): DDIMER in the last 72 hours. Hgb A1c No results for input(s): HGBA1C in the last 72 hours. Lipid Profile No results for input(s): CHOL, HDL, LDLCALC, TRIG, CHOLHDL, LDLDIRECT in the last 72 hours. Thyroid function studies No results for input(s): TSH, T4TOTAL, T3FREE, THYROIDAB in the last 72 hours.  Invalid input(s): FREET3 Anemia work up No results for input(s): VITAMINB12, FOLATE, FERRITIN, TIBC, IRON, RETICCTPCT in the last 72 hours. Microbiology No results found for this or any previous visit (from the past 240 hour(s)).    Studies:  Ct Chest W Contrast  02/24/2015   CLINICAL DATA:  64 year old female with known metastatic pancreatic cancer and new symptoms of abdominal pain, nausea and vomiting  EXAM: CT CHEST, ABDOMEN, AND PELVIS WITH CONTRAST  TECHNIQUE: Multidetector CT  imaging of the chest, abdomen and pelvis was performed following the standard protocol during bolus administration of intravenous contrast.  CONTRAST:  137mL OMNIPAQUE IOHEXOL 300 MG/ML  SOLN  COMPARISON:  Most recent CT abdomen/pelvis 12/27/2014; prior CT scan of the chest 10/26/2014  FINDINGS: CT CHEST FINDINGS  Mediastinum/Nodes: Unremarkable CT appearance of the thyroid gland. No suspicious mediastinal or hilar adenopathy. No soft tissue mediastinal mass. The thoracic esophagus is unremarkable.  Lungs/Pleura: Scattered foci of ground-glass attenuation opacity in the right upper lobe and along the major fissure are similar to slightly more conspicuous than previously seen. There are 2  subpleural nodules within the major fissure which were previously barely detectable but have since enlarged measuring up to 4 mm. No definite new intraparenchymal pulmonary nodule. No new airspace consolidation, pulmonary edema, pleural effusion or pneumothorax. Right IJ approach single-lumen power injectable port catheter. Catheter tip terminates at the superior cavoatrial junction. No evidence of aneurysm. The heart is within normal limits for size. No pericardial effusion.  Musculoskeletal: Stable sclerotic focus in the superior endplate of T4. The sclerotic focus in the lateral aspect of the L1 vertebral body has enlarged compared 12/27/2014. The lesion now measures up to 1.5 cm compared to 1.0 cm previously. No acute fracture.  CT ABDOMEN PELVIS FINDINGS  Hepatobiliary: Similar degree of left-sided biliary ductal dilatation. The segments 2 and 3 biliary ducts appear obstructed by an a amorphous hepatic metastasis. Newly developing central biliary ductal dilatation in the anterior and posterior segmental ducts of the right hepatic lobe. This was not previously present. There are innumerable hepatic lesions bilaterally which are enlarging. An index lesion within the right hepatic lobe has increased from approximately 1.8 cm to 2.5 cm. A second index lesion in the anterior aspect of hepatic segment 5 now measures 2.7 cm compared to 2.1 cm previously. A lesion in the deep aspect of hepatic segment 4B is similar at 2.8 cm compared to 2.7 cm previously. A lesion in the periphery of the liver at the interface of segment 5 and 6 now measures 3.5 cm compared to 2.3 cm. Multiple other lesions also demonstrate interval enlargement.  Interval development of mild perihepatic ascites. The gallbladder is decompressed.  Pancreas: An amorphous slightly hypoenhancing mass in the body of the pancreas is less well-defined than on prior imaging and difficult to measure. The mass measures approximately 4.8 x 3.9 cm compared to 5.0 x  3.1 cm. It appears slightly more full. The pancreatic tail distal to this lesion is atrophic with mild ductal dilatation. This is similar compared to prior.  Spleen: No focal splenic abnormality.  Trace perisplenic ascites.  Adrenals/Urinary Tract: The adrenal glands are within normal limits. No enhancing renal mass, hydronephrosis or nephrolithiasis. Stable 5.3 cm cyst exophytic from the lower pole of the right kidney.  Stomach/Bowel: Unremarkable CT appearance of the stomach, duodenum, and intestine. No focal bowel wall thickening or evidence of obstruction. Colonic diverticular disease without CT evidence of active inflammation. Mild stranding is present within the small bowel mesentery and ascending colon mesial colon. This was not previously present.  Vascular/Lymphatic: Enlarging para-aortic lymph nodes. A right celiac station node now measures 1.4 x 0.8 cm compared to being barely detectable at a maximum of 7 mm previously. This node is adjacent to the left adrenal gland on image 59 of series 3.  Reproductive: Unremarkable bladder, uterus and adnexa. Small free fluid within the pelvis.  Other: Subtle enhancing nodularity along the peritoneal surface adjacent to the liver in the right  perihepatic space (images 65, 71, 72, 73 and 75 of series 3).  Musculoskeletal: Stable mixed lytic and sclerotic lesion in the left femoral head. New sclerotic metastatic lesion in the inferior left ischial tuberosity measuring 7 mm. Slightly enlarged mixed lytic and sclerotic lesion in the posterior aspect of the L2 vertebral body. This is best demonstrated on the sagittal reformatted images were the lesion measures 2 cm. Stable reticulation of the subcutaneous fat with multiple small nodules and focal skin thickening. This is not significantly different compared to prior.  IMPRESSION: 1. There has been interval progression of metastatic disease compared 12/27/2014. Specifically, there has been interval enlargement of numerous  hepatic metastatic lesions as well as interval development of small volume ascites and subtle nodularity of the peritoneal surface in the perihepatic space and throughout the mesentery concerning for peritoneal carcinomatosis. Additionally, there is increasing retroperitoneal adenopathy and progressive osseous metastatic disease at L1, L2 and in the left ischial tuberosity. 2. Slightly increased subpleural nodularity along the major fissure of the right lung may represent increasing prominence of subpleural lymph nodes due to an underlying infectious/inflammatory process, or an additional site of progressive metastatic disease. 3. New developing biliary ductal dilatation of the central anterior and posterior segmental bile ducts in the right biliary tree. These were previously normal. The left intrahepatic biliary ductal dilatation remains stable. 4. Similar to slightly enlarged amorphous hypoenhancing mass in the body of the pancreas consistent with the primary pancreatic neoplasm. 5. Colonic diverticular disease without CT evidence of active inflammation. 6. Stable skin thickening with reticulation of the underlying subcutaneous fat and nodularity in the right lower quadrant anterior abdominal wall. This may represent reactive changes if the patient is undergoing subcutaneous medicine injections. If there are no injections in this region, this could represent a site of cutaneous metastatic disease.   Electronically Signed   By: Jacqulynn Cadet M.D.   On: 02/24/2015 08:14   Ct Abdomen Pelvis W Contrast  02/24/2015   CLINICAL DATA:  64 year old female with known metastatic pancreatic cancer and new symptoms of abdominal pain, nausea and vomiting  EXAM: CT CHEST, ABDOMEN, AND PELVIS WITH CONTRAST  TECHNIQUE: Multidetector CT imaging of the chest, abdomen and pelvis was performed following the standard protocol during bolus administration of intravenous contrast.  CONTRAST:  139mL OMNIPAQUE IOHEXOL 300 MG/ML   SOLN  COMPARISON:  Most recent CT abdomen/pelvis 12/27/2014; prior CT scan of the chest 10/26/2014  FINDINGS: CT CHEST FINDINGS  Mediastinum/Nodes: Unremarkable CT appearance of the thyroid gland. No suspicious mediastinal or hilar adenopathy. No soft tissue mediastinal mass. The thoracic esophagus is unremarkable.  Lungs/Pleura: Scattered foci of ground-glass attenuation opacity in the right upper lobe and along the major fissure are similar to slightly more conspicuous than previously seen. There are 2 subpleural nodules within the major fissure which were previously barely detectable but have since enlarged measuring up to 4 mm. No definite new intraparenchymal pulmonary nodule. No new airspace consolidation, pulmonary edema, pleural effusion or pneumothorax. Right IJ approach single-lumen power injectable port catheter. Catheter tip terminates at the superior cavoatrial junction. No evidence of aneurysm. The heart is within normal limits for size. No pericardial effusion.  Musculoskeletal: Stable sclerotic focus in the superior endplate of T4. The sclerotic focus in the lateral aspect of the L1 vertebral body has enlarged compared 12/27/2014. The lesion now measures up to 1.5 cm compared to 1.0 cm previously. No acute fracture.  CT ABDOMEN PELVIS FINDINGS  Hepatobiliary: Similar degree of left-sided biliary ductal dilatation.  The segments 2 and 3 biliary ducts appear obstructed by an a amorphous hepatic metastasis. Newly developing central biliary ductal dilatation in the anterior and posterior segmental ducts of the right hepatic lobe. This was not previously present. There are innumerable hepatic lesions bilaterally which are enlarging. An index lesion within the right hepatic lobe has increased from approximately 1.8 cm to 2.5 cm. A second index lesion in the anterior aspect of hepatic segment 5 now measures 2.7 cm compared to 2.1 cm previously. A lesion in the deep aspect of hepatic segment 4B is similar at  2.8 cm compared to 2.7 cm previously. A lesion in the periphery of the liver at the interface of segment 5 and 6 now measures 3.5 cm compared to 2.3 cm. Multiple other lesions also demonstrate interval enlargement.  Interval development of mild perihepatic ascites. The gallbladder is decompressed.  Pancreas: An amorphous slightly hypoenhancing mass in the body of the pancreas is less well-defined than on prior imaging and difficult to measure. The mass measures approximately 4.8 x 3.9 cm compared to 5.0 x 3.1 cm. It appears slightly more full. The pancreatic tail distal to this lesion is atrophic with mild ductal dilatation. This is similar compared to prior.  Spleen: No focal splenic abnormality.  Trace perisplenic ascites.  Adrenals/Urinary Tract: The adrenal glands are within normal limits. No enhancing renal mass, hydronephrosis or nephrolithiasis. Stable 5.3 cm cyst exophytic from the lower pole of the right kidney.  Stomach/Bowel: Unremarkable CT appearance of the stomach, duodenum, and intestine. No focal bowel wall thickening or evidence of obstruction. Colonic diverticular disease without CT evidence of active inflammation. Mild stranding is present within the small bowel mesentery and ascending colon mesial colon. This was not previously present.  Vascular/Lymphatic: Enlarging para-aortic lymph nodes. A right celiac station node now measures 1.4 x 0.8 cm compared to being barely detectable at a maximum of 7 mm previously. This node is adjacent to the left adrenal gland on image 59 of series 3.  Reproductive: Unremarkable bladder, uterus and adnexa. Small free fluid within the pelvis.  Other: Subtle enhancing nodularity along the peritoneal surface adjacent to the liver in the right perihepatic space (images 65, 71, 72, 73 and 75 of series 3).  Musculoskeletal: Stable mixed lytic and sclerotic lesion in the left femoral head. New sclerotic metastatic lesion in the inferior left ischial tuberosity measuring  7 mm. Slightly enlarged mixed lytic and sclerotic lesion in the posterior aspect of the L2 vertebral body. This is best demonstrated on the sagittal reformatted images were the lesion measures 2 cm. Stable reticulation of the subcutaneous fat with multiple small nodules and focal skin thickening. This is not significantly different compared to prior.  IMPRESSION: 1. There has been interval progression of metastatic disease compared 12/27/2014. Specifically, there has been interval enlargement of numerous hepatic metastatic lesions as well as interval development of small volume ascites and subtle nodularity of the peritoneal surface in the perihepatic space and throughout the mesentery concerning for peritoneal carcinomatosis. Additionally, there is increasing retroperitoneal adenopathy and progressive osseous metastatic disease at L1, L2 and in the left ischial tuberosity. 2. Slightly increased subpleural nodularity along the major fissure of the right lung may represent increasing prominence of subpleural lymph nodes due to an underlying infectious/inflammatory process, or an additional site of progressive metastatic disease. 3. New developing biliary ductal dilatation of the central anterior and posterior segmental bile ducts in the right biliary tree. These were previously normal. The left intrahepatic biliary ductal dilatation  remains stable. 4. Similar to slightly enlarged amorphous hypoenhancing mass in the body of the pancreas consistent with the primary pancreatic neoplasm. 5. Colonic diverticular disease without CT evidence of active inflammation. 6. Stable skin thickening with reticulation of the underlying subcutaneous fat and nodularity in the right lower quadrant anterior abdominal wall. This may represent reactive changes if the patient is undergoing subcutaneous medicine injections. If there are no injections in this region, this could represent a site of cutaneous metastatic disease.    Electronically Signed   By: Jacqulynn Cadet M.D.   On: 02/24/2015 08:14    Assessment: 64 y.o. with metastatic pancreatic cancer, currently on second line chemotherapy  1. 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 Obstructive jaundice, secondary to liver metastasis 3. Thrombocytopenia 4. History of DVT 5. DM, HTN   Plan:  -I reviewed her CT scan results with patient and her ex-husband in great details. Unfortunately her disease has significant progressed through chemotherapy. -I reviewed limited treatment options. The response rate from third line chemotherapy is usually very low, <10%, and due to her significantly jaundiced and the worsening liver function, she is unlikely a candidate for further chemotherapy. -I had extensive discussion with interventional radiologist Dr. Earleen Newport regarding the benefits and risks of percutaneous biliary drainage. If patient still wants to try more chemotherapy, this is likely a necessary step to get her bilirubin level down. Will consult IR  -Given her very limited treatment options, I think hospice and palliative care is appropriate at this stage. We discussed the goal of care, and focus on her symptom management and quality of life. I think the patient still has not completely understood the severity of her metastatic cancer, may need some time to digest.  -I recommend palliative care consult -I have communicated the above with the hospitalist Dr. Sheran Fava.  -she is DNR/DNI.  -continue symptom management.   I will follow along with she is in the hospital.    Truitt Merle, MD 02/24/2015  10:00 PM

## 2015-02-24 NOTE — H&P (Signed)
Chief Complaint: Patient was seen in consultation today for biliary obstruction  Chief Complaint  Patient presents with  . Emesis   at the request of Oncology Dr. Burr Medico  Referring Physician(s): Oncology Dr. Burr Medico  History of Present Illness: Amber Bishop is a 64 y.o. female with metastatic pancreatic cancer with hepatic disease, admitted with abdominal pain, nausea and vomiting. She states she vomited tan color emesis x 4 last night and has not had any vomiting since. She has ate small portions today without emesis, but does admit to nausea. She denies any abdominal pain currently, but complains of back pain. She denies any recent fever or chills. She denies any active bleeding. She had a CT that revealed interval progression of metastatic disease with biliary obstruction. IR received request for possible biliary drain placement. She denies any chest pain, shortness of breath or palpitations.   Past Medical History  Diagnosis Date  . Anxiety   . Hypertension   . IBS (irritable bowel syndrome)   . Hyperplastic colon polyp   . Allergy   . Anemia   . Arthritis   . Diverticulosis   . Dilated aortic root ([place])     seen on prior echo but echo 05/2013 showed normal dimensions  . Bicuspid aortic valve   . LVE (left ventricular enlargement)     mild by echo 1/15 with EF 50-55%  . pancreatic ca w/ liver mets dx'd 07/2014  . Diabetes mellitus without complication [facility])     Past Surgical History  Procedure Laterality Date  . Tubal ligation      age 40  . Colonoscopy    . Polypectomy    . Tooth extraction      3 teeth  . Belpharoptosis repair      Allergies: Naprosyn and Metformin and related  Medications: Prior to Admission medications   Medication Sig Start Date End Date Taking? Authorizing Provider  ALPRAZolam (XANAX) 0.5 MG tablet TAKE 1 TABLET THREE TIMES DAILY IF NEEDED FOR ANXIETY Patient taking differently: Take 0.5 mg by mouth 3 (three) times daily as needed for  anxiety.  02/17/15  Yes Gatha Mayer, MD  amLODipine (NORVASC) 5 MG tablet Take 5 mg by mouth every morning.  03/16/14  Yes Historical Provider, MD  Calcium Carbonate-Vitamin D (CALCIUM 600/VITAMIN D) 600-400 MG-UNIT per tablet Take 1 tablet by mouth 2 (two) times daily.    Yes Historical Provider, MD  cetirizine (ZYRTEC) 10 MG tablet Take 10 mg by mouth daily.   Yes Historical Provider, MD  cholestyramine Lucrezia Starch) 4 G packet Take 1/2-1 packet dissolved in water/juice once daily....................Marland KitchenPRN Patient taking differently: Take 2-4 g by mouth daily as needed (IBS).  01/24/11  Yes Lafayette Dragon, MD  FLUoxetine (PROZAC) 20 MG capsule Take 20 mg by mouth every morning.    Yes Historical Provider, MD  insulin aspart (NOVOLOG FLEXPEN) 100 UNIT/ML FlexPen Inject 14-18 units into the skin 3 times daily as instructed. Patient taking differently: Inject 34-38 Units into the skin 3 (three) times daily with meals.  07/30/14  Yes Philemon Kingdom, MD  insulin glargine (LANTUS) 100 UNIT/ML injection Inject 60 Units into the skin at bedtime.    Yes Historical Provider, MD  lidocaine-prilocaine (EMLA) cream Apply 1 application topically as needed. Patient taking differently: Apply 1 application topically as needed (port).  12/15/14  Yes Truitt Merle, MD  Multiple Vitamins-Minerals (CENTRUM SILVER PO) Take 1 capsule by mouth every morning.    Yes Historical Provider,  MD  Omega-3 Fatty Acids (FISH OIL) 1000 MG CAPS Take 1 capsule by mouth every morning.    Yes Historical Provider, MD  OVER THE COUNTER MEDICATION Apply 1 drop topically 3 (three) times daily. Essential Oils.   Yes Historical Provider, MD  valsartan-hydrochlorothiazide (DIOVAN-HCT) 320-12.5 MG per tablet Take 1 tablet by mouth every morning.  03/16/14  Yes Historical Provider, MD  blood glucose meter kit and supplies KIT Dispense based on patient and insurance preference. Use up to four times daily as directed. Dx code: E11.65 07/15/14   Philemon Kingdom, MD  FLUZONE HIGH-DOSE 0.5 ML SUSY inject 0.5 milliliter intramuscularly 01/23/15   Historical Provider, MD  glucose blood (ACCU-CHEK AVIVA PLUS) test strip Test 4 times daily as instructed. 06/28/14   Philemon Kingdom, MD  Insulin Pen Needle (VALUMARK PEN NEEDLES) 31G X 8 MM MISC Use to inject insulin 4 times daily as instructed. 03/18/14   Philemon Kingdom, MD  LANCETS ULTRA THIN 30G MISC Use to test blood sugar 4 times daily as instructed. Dx code: E11.65 07/15/14   Philemon Kingdom, MD     Family History  Problem Relation Age of Onset  . Colon cancer Neg Hx   . Esophageal cancer Neg Hx   . Stomach cancer Neg Hx   . Asthma Mother     Social History   Social History  . Marital Status: Single    Spouse Name: N/A  . Number of Children: N/A  . Years of Education: N/A   Occupational History  . Retired    Social History Main Topics  . Smoking status: Never Smoker   . Smokeless tobacco: Never Used  . Alcohol Use: No  . Drug Use: No  . Sexual Activity: Not Asked   Other Topics Concern  . 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  All other systems are negative.  Review of Systems  Vital Signs: BP 147/82 mmHg  Pulse 89  Temp(Src) 98.6 F (37 C) (Oral)  Resp 18  Ht _0  (1.626 m)  Wt 214 lb (97.07 kg)  BMI 36.72 kg/m2  SpO2 100%  Physical Exam  Constitutional: She is oriented to person, place, and time. No distress.  Jaundice  HENT:  Head: Normocephalic and atraumatic.  Neck: No tracheal deviation present.  Cardiovascular: Normal rate and regular rhythm.  Exam reveals no gallop and no friction rub.   No murmur heard. Pulmonary/Chest: Effort normal and breath sounds normal. No respiratory distress. She has no wheezes. She has no  rales.  Abdominal: Soft. Bowel sounds are normal. She exhibits distension. There is no tenderness.  Neurological: She is alert and oriented to person, place, and time.  Skin: She is not diaphoretic.    Mallampati Score:  MD Evaluation Airway: WNL Heart: WNL Abdomen: WNL Chest/ Lungs: WNL ASA  Classification: 3 Mallampati/Airway Score: Two  Imaging: Ct Chest W Contrast  02/24/2015   CLINICAL DATA:  64 year old female with known metastatic pancreatic cancer and new symptoms of abdominal pain, nausea and vomiting  EXAM: CT CHEST, ABDOMEN, AND PELVIS WITH CONTRAST  TECHNIQUE: Multidetector CT imaging of the chest, abdomen and pelvis was performed following the standard protocol during bolus administration of intravenous contrast.  CONTRAST:  129m OMNIPAQUE  IOHEXOL 300 MG/ML  SOLN  COMPARISON:  Most recent CT abdomen/pelvis 12/27/2014; prior CT scan of the chest 10/26/2014  FINDINGS: CT CHEST FINDINGS  Mediastinum/Nodes: Unremarkable CT appearance of the thyroid gland. No suspicious mediastinal or hilar adenopathy. No soft tissue mediastinal mass. The thoracic esophagus is unremarkable.  Lungs/Pleura: Scattered foci of ground-glass attenuation opacity in the right upper lobe and along the major fissure are similar to slightly more conspicuous than previously seen. There are 2 subpleural nodules within the major fissure which were previously barely detectable but have since enlarged measuring up to 4 mm. No definite new intraparenchymal pulmonary nodule. No new airspace consolidation, pulmonary edema, pleural effusion or pneumothorax. Right IJ approach single-lumen power injectable port catheter. Catheter tip terminates at the superior cavoatrial junction. No evidence of aneurysm. The heart is within normal limits for size. No pericardial effusion.  Musculoskeletal: Stable sclerotic focus in the superior endplate of T4. The sclerotic focus in the lateral aspect of the L1 vertebral body has enlarged  compared 12/27/2014. The lesion now measures up to 1.5 cm compared to 1.0 cm previously. No acute fracture.  CT ABDOMEN PELVIS FINDINGS  Hepatobiliary: Similar degree of left-sided biliary ductal dilatation. The segments 2 and 3 biliary ducts appear obstructed by an a amorphous hepatic metastasis. Newly developing central biliary ductal dilatation in the anterior and posterior segmental ducts of the right hepatic lobe. This was not previously present. There are innumerable hepatic lesions bilaterally which are enlarging. An index lesion within the right hepatic lobe has increased from approximately 1.8 cm to 2.5 cm. A second index lesion in the anterior aspect of hepatic segment 5 now measures 2.7 cm compared to 2.1 cm previously. A lesion in the deep aspect of hepatic segment 4B is similar at 2.8 cm compared to 2.7 cm previously. A lesion in the periphery of the liver at the interface of segment 5 and 6 now measures 3.5 cm compared to 2.3 cm. Multiple other lesions also demonstrate interval enlargement.  Interval development of mild perihepatic ascites. The gallbladder is decompressed.  Pancreas: An amorphous slightly hypoenhancing mass in the body of the pancreas is less well-defined than on prior imaging and difficult to measure. The mass measures approximately 4.8 x 3.9 cm compared to 5.0 x 3.1 cm. It appears slightly more full. The pancreatic tail distal to this lesion is atrophic with mild ductal dilatation. This is similar compared to prior.  Spleen: No focal splenic abnormality.  Trace perisplenic ascites.  Adrenals/Urinary Tract: The adrenal glands are within normal limits. No enhancing renal mass, hydronephrosis or nephrolithiasis. Stable 5.3 cm cyst exophytic from the lower pole of the right kidney.  Stomach/Bowel: Unremarkable CT appearance of the stomach, duodenum, and intestine. No focal bowel wall thickening or evidence of obstruction. Colonic diverticular disease without CT evidence of active  inflammation. Mild stranding is present within the small bowel mesentery and ascending colon mesial colon. This was not previously present.  Vascular/Lymphatic: Enlarging para-aortic lymph nodes. A right celiac station node now measures 1.4 x 0.8 cm compared to being barely detectable at a maximum of 7 mm previously. This node is adjacent to the left adrenal gland on image 59 of series 3.  Reproductive: Unremarkable bladder, uterus and adnexa. Small free fluid within the pelvis.  Other: Subtle enhancing nodularity along the peritoneal surface adjacent to the liver in the right perihepatic space (images 65, 71, 72, 73 and 75 of series 3).  Musculoskeletal: Stable mixed lytic and sclerotic lesion in the left femoral  head. New sclerotic metastatic lesion in the inferior left ischial tuberosity measuring 7 mm. Slightly enlarged mixed lytic and sclerotic lesion in the posterior aspect of the L2 vertebral body. This is best demonstrated on the sagittal reformatted images were the lesion measures 2 cm. Stable reticulation of the subcutaneous fat with multiple small nodules and focal skin thickening. This is not significantly different compared to prior.  IMPRESSION: 1. There has been interval progression of metastatic disease compared 12/27/2014. Specifically, there has been interval enlargement of numerous hepatic metastatic lesions as well as interval development of small volume ascites and subtle nodularity of the peritoneal surface in the perihepatic space and throughout the mesentery concerning for peritoneal carcinomatosis. Additionally, there is increasing retroperitoneal adenopathy and progressive osseous metastatic disease at L1, L2 and in the left ischial tuberosity. 2. Slightly increased subpleural nodularity along the major fissure of the right lung may represent increasing prominence of subpleural lymph nodes due to an underlying infectious/inflammatory process, or an additional site of progressive metastatic  disease. 3. New developing biliary ductal dilatation of the central anterior and posterior segmental bile ducts in the right biliary tree. These were previously normal. The left intrahepatic biliary ductal dilatation remains stable. 4. Similar to slightly enlarged amorphous hypoenhancing mass in the body of the pancreas consistent with the primary pancreatic neoplasm. 5. Colonic diverticular disease without CT evidence of active inflammation. 6. Stable skin thickening with reticulation of the underlying subcutaneous fat and nodularity in the right lower quadrant anterior abdominal wall. This may represent reactive changes if the patient is undergoing subcutaneous medicine injections. If there are no injections in this region, this could represent a site of cutaneous metastatic disease.   Electronically Signed   By: Jacqulynn Cadet M.D.   On: 02/24/2015 08:14   Ct Abdomen Pelvis W Contrast  02/24/2015   CLINICAL DATA:  64 year old female with known metastatic pancreatic cancer and new symptoms of abdominal pain, nausea and vomiting  EXAM: CT CHEST, ABDOMEN, AND PELVIS WITH CONTRAST  TECHNIQUE: Multidetector CT imaging of the chest, abdomen and pelvis was performed following the standard protocol during bolus administration of intravenous contrast.  CONTRAST:  15m OMNIPAQUE IOHEXOL 300 MG/ML  SOLN  COMPARISON:  Most recent CT abdomen/pelvis 12/27/2014; prior CT scan of the chest 10/26/2014  FINDINGS: CT CHEST FINDINGS  Mediastinum/Nodes: Unremarkable CT appearance of the thyroid gland. No suspicious mediastinal or hilar adenopathy. No soft tissue mediastinal mass. The thoracic esophagus is unremarkable.  Lungs/Pleura: Scattered foci of ground-glass attenuation opacity in the right upper lobe and along the major fissure are similar to slightly more conspicuous than previously seen. There are 2 subpleural nodules within the major fissure which were previously barely detectable but have since enlarged measuring up  to 4 mm. No definite new intraparenchymal pulmonary nodule. No new airspace consolidation, pulmonary edema, pleural effusion or pneumothorax. Right IJ approach single-lumen power injectable port catheter. Catheter tip terminates at the superior cavoatrial junction. No evidence of aneurysm. The heart is within normal limits for size. No pericardial effusion.  Musculoskeletal: Stable sclerotic focus in the superior endplate of T4. The sclerotic focus in the lateral aspect of the L1 vertebral body has enlarged compared 12/27/2014. The lesion now measures up to 1.5 cm compared to 1.0 cm previously. No acute fracture.  CT ABDOMEN PELVIS FINDINGS  Hepatobiliary: Similar degree of left-sided biliary ductal dilatation. The segments 2 and 3 biliary ducts appear obstructed by an a amorphous hepatic metastasis. Newly developing central biliary ductal dilatation in the anterior  and posterior segmental ducts of the right hepatic lobe. This was not previously present. There are innumerable hepatic lesions bilaterally which are enlarging. An index lesion within the right hepatic lobe has increased from approximately 1.8 cm to 2.5 cm. A second index lesion in the anterior aspect of hepatic segment 5 now measures 2.7 cm compared to 2.1 cm previously. A lesion in the deep aspect of hepatic segment 4B is similar at 2.8 cm compared to 2.7 cm previously. A lesion in the periphery of the liver at the interface of segment 5 and 6 now measures 3.5 cm compared to 2.3 cm. Multiple other lesions also demonstrate interval enlargement.  Interval development of mild perihepatic ascites. The gallbladder is decompressed.  Pancreas: An amorphous slightly hypoenhancing mass in the body of the pancreas is less well-defined than on prior imaging and difficult to measure. The mass measures approximately 4.8 x 3.9 cm compared to 5.0 x 3.1 cm. It appears slightly more full. The pancreatic tail distal to this lesion is atrophic with mild ductal  dilatation. This is similar compared to prior.  Spleen: No focal splenic abnormality.  Trace perisplenic ascites.  Adrenals/Urinary Tract: The adrenal glands are within normal limits. No enhancing renal mass, hydronephrosis or nephrolithiasis. Stable 5.3 cm cyst exophytic from the lower pole of the right kidney.  Stomach/Bowel: Unremarkable CT appearance of the stomach, duodenum, and intestine. No focal bowel wall thickening or evidence of obstruction. Colonic diverticular disease without CT evidence of active inflammation. Mild stranding is present within the small bowel mesentery and ascending colon mesial colon. This was not previously present.  Vascular/Lymphatic: Enlarging para-aortic lymph nodes. A right celiac station node now measures 1.4 x 0.8 cm compared to being barely detectable at a maximum of 7 mm previously. This node is adjacent to the left adrenal gland on image 59 of series 3.  Reproductive: Unremarkable bladder, uterus and adnexa. Small free fluid within the pelvis.  Other: Subtle enhancing nodularity along the peritoneal surface adjacent to the liver in the right perihepatic space (images 65, 71, 72, 73 and 75 of series 3).  Musculoskeletal: Stable mixed lytic and sclerotic lesion in the left femoral head. New sclerotic metastatic lesion in the inferior left ischial tuberosity measuring 7 mm. Slightly enlarged mixed lytic and sclerotic lesion in the posterior aspect of the L2 vertebral body. This is best demonstrated on the sagittal reformatted images were the lesion measures 2 cm. Stable reticulation of the subcutaneous fat with multiple small nodules and focal skin thickening. This is not significantly different compared to prior.  IMPRESSION: 1. There has been interval progression of metastatic disease compared 12/27/2014. Specifically, there has been interval enlargement of numerous hepatic metastatic lesions as well as interval development of small volume ascites and subtle nodularity of the  peritoneal surface in the perihepatic space and throughout the mesentery concerning for peritoneal carcinomatosis. Additionally, there is increasing retroperitoneal adenopathy and progressive osseous metastatic disease at L1, L2 and in the left ischial tuberosity. 2. Slightly increased subpleural nodularity along the major fissure of the right lung may represent increasing prominence of subpleural lymph nodes due to an underlying infectious/inflammatory process, or an additional site of progressive metastatic disease. 3. New developing biliary ductal dilatation of the central anterior and posterior segmental bile ducts in the right biliary tree. These were previously normal. The left intrahepatic biliary ductal dilatation remains stable. 4. Similar to slightly enlarged amorphous hypoenhancing mass in the body of the pancreas consistent with the primary pancreatic neoplasm. 5. Colonic  diverticular disease without CT evidence of active inflammation. 6. Stable skin thickening with reticulation of the underlying subcutaneous fat and nodularity in the right lower quadrant anterior abdominal wall. This may represent reactive changes if the patient is undergoing subcutaneous medicine injections. If there are no injections in this region, this could represent a site of cutaneous metastatic disease.   Electronically Signed   By: Jacqulynn Cadet M.D.   On: 02/24/2015 08:14    Labs:  CBC:  Recent Labs  02/03/15 1310 02/10/15 0952 02/17/15 1137 02/24/15 0423  WBC 8.1 5.6 5.0 8.5  HGB 13.1 12.3 11.4* 12.5  HCT 39.5 37.4 35.5 38.1  PLT 142* 111* 67* 187    COAGS:  Recent Labs  08/17/14 1010  11/06/14 2150  02/10/15 0952 02/17/15 02/17/15 1137 02/24/15 1255  INR 1.08  < > 1.09  < > 3.40 3 3.00 8.00*  APTT 28  --  28  --   --   --   --  48*  < > = values in this interval not displayed.  BMP:  Recent Labs  11/06/14 1548 11/07/14 0501 11/09/14 0414  02/03/15 1310 02/10/15 0952 02/17/15 1137  02/24/15 0423  NA 132* 137 138  < > 137 142 139 136  K 4.5 4.0 4.0  < > 3.9 3.6 3.7 3.8  CL 103 98* 102  --   --   --   --  99*  CO2 _0 < > _1 GLUCOSE 452* 301* 152*  < > 225* 113 280* 267*  BUN _2 < > 13.5 15.7 15.3 14  CALCIUM 7.9* 8.5* 8.5*  < > 9.3 9.1 9.0 8.4*  CREATININE 0.78 0.66 0.59  < > 0.8 0.9 0.9 0.40*  GFRNONAA >60 >60 >60  --   --   --   --  >60  GFRAA >60 >60 >60  --   --   --   --  >60  < > = values in this interval not displayed.  LIVER FUNCTION TESTS:  Recent Labs  02/03/15 1310 02/10/15 0952 02/17/15 1137 02/24/15 0423  BILITOT 1.17 1.05 0.97 7.1*  AST 82* 112* 95* 180*  ALT 97* 149* 157* 190*  ALKPHOS 251* 300* 324* 366*  PROT 6.2* 6.1* 5.8* 6.1*  ALBUMIN 3.2* 3.3* 3.1* 3.3*    TUMOR MARKERS:  Recent Labs  08/24/14 1328 02/10/15 0952  CA199 >140000.0* >140000.0*    Assessment and Plan: Metastatic pancreatic cancer with liver metastatic disease Admitted with abdominal pain, nausea and vomiting Jaundice CT revealed interval progression of metastatic disease with biliary obstruction, T. Bili 7.1, LFT's trending up, wbc wnl, afebrile RLE DVT in 10/2014 on Coumadin, INR 8 today- receiving Vitamin K Oncology Dr. Burr Medico has seen the patient and request is made for percutaneous transhepatic cholangiogram and external biliary drain placement with sedation Risks and Benefits discussed with the patient including, but not limited to bleeding, infection which may lead to sepsis or even death and damage to adjacent structures. She is aware we will need her INR to be closer to 1.5 for procedure since she is currently stable and also that the biliary drain will most likely be permanent. She is going to think about all that we talked about today with her family  IR will follow   Thank you for this interesting consult.  I greatly enjoyed meeting DOLOROS KWOLEK and look forward to participating in their care.  A copy of  this report was sent to  the requesting provider on this date.  SignedHedy Jacob 02/24/2015, 3:24 PM   I spent a total of 20 Minutes in face to face in clinical consultation, greater than 50% of which was counseling/coordinating care for biliary obstruction.

## 2015-02-24 NOTE — ED Notes (Signed)
Pt states that she has vomited a few times since 9 pm last night, she doesn't believe it's cancer related and thinks it's an upset stomach, she called the cancer Dr's and they told her to come to the ER, no diarrhea. She is scheduled to have chemo today at 2pm.

## 2015-02-24 NOTE — ED Notes (Signed)
Pt transported to CT ?

## 2015-02-24 NOTE — ED Provider Notes (Signed)
I reviewed the patient's CT imaging, subsequently discussed the findings with her.  I discussed the patient's findings, labs, CT with her oncologist.  Admission recommended  On repeat exam the patient is awake and alert, continues to have pain, mild nausea.  We discussed admission, need for ongoing discussion of her increased metastases.   Carmin Muskrat, MD 02/24/15 [phone]

## 2015-02-24 NOTE — Progress Notes (Signed)
CRITICAL VALUE ALERT  Critical value received:  INR 8.0  Date of notification:  02/24/15  Time of notification:  2763  Critical value read back: yes  Nurse who received alert:  Deneen Harts RN  MD notified (1st page):  yes  Time of first page:  1355  MD notified (2nd page):  Time of second page:  Responding MD:  Dr. Sheran Fava  Time MD responded:  1400

## 2015-02-25 ENCOUNTER — Telehealth: Payer: Self-pay | Admitting: *Deleted

## 2015-02-25 LAB — GLUCOSE, CAPILLARY
GLUCOSE-CAPILLARY: 216 mg/dL — AB (ref 65–99)
GLUCOSE-CAPILLARY: 285 mg/dL — AB (ref 65–99)
Glucose-Capillary: 347 mg/dL — ABNORMAL HIGH (ref 65–99)
Glucose-Capillary: 171 mg/dL — ABNORMAL HIGH (ref 65–99)
Glucose-Capillary: 188 mg/dL — ABNORMAL HIGH (ref 65–99)

## 2015-02-25 LAB — CBC
HEMATOCRIT: 36.9 % (ref 36.0–46.0)
Hemoglobin: 12 g/dL (ref 12.0–15.0)
MCH: 28.8 pg (ref 26.0–34.0)
MCHC: 32.5 g/dL (ref 30.0–36.0)
MCV: 88.7 fL (ref 78.0–100.0)
PLATELETS: 195 10*3/uL (ref 150–400)
RBC: 4.16 MIL/uL (ref 3.87–5.11)
RDW: 17.8 % — AB (ref 11.5–15.5)
WBC: 7.4 10*3/uL (ref 4.0–10.5)

## 2015-02-25 LAB — URINE CULTURE: SPECIAL REQUESTS: NORMAL

## 2015-02-25 LAB — COMPREHENSIVE METABOLIC PANEL
ALT: 170 U/L — ABNORMAL HIGH (ref 14–54)
ANION GAP: 6 (ref 5–15)
AST: 153 U/L — AB (ref 15–41)
Albumin: 2.8 g/dL — ABNORMAL LOW (ref 3.5–5.0)
Alkaline Phosphatase: 337 U/L — ABNORMAL HIGH (ref 38–126)
BILIRUBIN TOTAL: 8.5 mg/dL — AB (ref 0.3–1.2)
BUN: 11 mg/dL (ref 6–20)
CO2: 29 mmol/L (ref 22–32)
Calcium: 8 mg/dL — ABNORMAL LOW (ref 8.9–10.3)
Chloride: 104 mmol/L (ref 101–111)
Creatinine, Ser: 0.3 mg/dL — ABNORMAL LOW (ref 0.44–1.00)
GFR calc Af Amer: >60 mL/min (ref >60)
Glucose, Bld: 238 mg/dL — ABNORMAL HIGH (ref 65–99)
POTASSIUM: 3.8 mmol/L (ref 3.5–5.1)
Sodium: 139 mmol/L (ref 135–145)
Total Protein: 5.4 g/dL — ABNORMAL LOW (ref 6.5–8.1)

## 2015-02-25 LAB — PROTIME-INR
INR: 8.21 — AB (ref 0.00–1.49)
Prothrombin Time: 65.2 seconds — ABNORMAL HIGH (ref 11.6–15.2)

## 2015-02-25 MED ORDER — INSULIN GLARGINE 100 UNIT/ML ~~LOC~~ SOLN
40.0000 [IU] | Freq: Every day | SUBCUTANEOUS | Status: DC
  Filled 2015-02-25: qty 0.4

## 2015-02-25 MED ORDER — INSULIN ASPART 100 UNIT/ML ~~LOC~~ SOLN
15.0000 [IU] | Freq: Three times a day (TID) | SUBCUTANEOUS | Status: DC
  Administered 2015-02-25 – 2015-02-28 (×8): 15 [IU] via SUBCUTANEOUS

## 2015-02-25 MED ORDER — PHYTONADIONE 5 MG PO TABS
10.0000 mg | ORAL_TABLET | Freq: Once | ORAL | Status: AC
  Administered 2015-02-25: 10 mg via ORAL
  Filled 2015-02-25: qty 2

## 2015-02-25 NOTE — Progress Notes (Signed)
ANTICOAGULATION CONSULT NOTE - Follow up  Pharmacy Consult for heparin Indication: DVT  Allergies  Allergen Reactions  . Naprosyn [Naproxen] Nausea And Vomiting    Very nauseated but no vomiting.  . Metformin And Related Diarrhea    Pt has IBS     Patient Measurements: Height: 5\' 4"  (162.6 cm) Weight: 214 lb (97.07 kg) IBW/kg (Calculated) : 54.7 Heparin Dosing Weight: 81kg  Vital Signs: Temp: 97.9 F (36.6 C) (10/07 0608) Temp Source: Oral (10/07 0608) BP: 132/82 mmHg (10/07 0608) Pulse Rate: 91 (10/07 0608)  Labs:  Recent Labs  02/24/15 0423 02/24/15 1255 02/25/15 0447  HGB 12.5  --  12.0  HCT 38.1  --  36.9  PLT 187  --  195  APTT  --  48*  --   LABPROT  --  64.0* 65.2*  INR  --  8.00* 8.21*  CREATININE 0.40*  --  0.30*    Estimated Creatinine Clearance: 78.3 mL/min (by C-G formula based on Cr of 0.3).   Medical History: Past Medical History  Diagnosis Date  . Anxiety   . Hypertension   . IBS (irritable bowel syndrome)   . Hyperplastic colon polyp   . Allergy   . Anemia   . Arthritis   . Diverticulosis   . Dilated aortic root ([place])     seen on prior echo but echo 05/2013 showed normal dimensions  . Bicuspid aortic valve   . LVE (left ventricular enlargement)     mild by echo 1/15 with EF 50-55%  . pancreatic ca w/ liver mets dx'd 07/2014  . Diabetes mellitus without complication [facility])     Assessment: Lagi.Manning F w/ metastatic pancreatic cancer presented to the ED w/ abdominal pain and N/V. She takes warfarin for a RLE DVT. MD note states she also has a history of chronic thrombocytopenia but platelets are in the normal range on admission. Warfarin was not resumed on admission and pharmacy is asked to dose heparin. INR = 8. No bleeding reported/documented.  02/25/2015 INR 8.21 despite 10mg  po vitamin K x 1 yesterday H/H WNL Plts WNL  Goal of Therapy:  Heparin level 0.3-0.7 units/ml Monitor platelets by anticoagulation protocol: Yes   Plan:  No  heparin for now.  F/u INR daily and start heparin when it is <2 unless otherwise instructed.    Dolly Rias RPh 02/25/2015, 7:50 AM Pager [phone]

## 2015-02-25 NOTE — Progress Notes (Signed)
Inpatient Diabetes Program Recommendations  AACE/ADA: New Consensus Statement on Inpatient Glycemic Control (2015)  Target Ranges:  Prepandial:   less than 140 mg/dL      Peak postprandial:   less than 180 mg/dL (1-2 hours)      Critically ill patients:  140 - 180 mg/dL   Review of Glycemic Control  Diabetes history: DM2 Outpatient Diabetes medications: Lantus 60 units QHS, Novolog 34-38 units tidw Current orders for Inpatient glycemic control: Lantus 30 units QHS, Novolog sensitive tidwc and hs + 10 units tidwc for meal coverage  Results for LEBA, TIBBITTS (MRN [id number]) as of 02/25/2015 12:40  Ref. Range 02/24/2015 13:06 02/24/2015 17:06 02/24/2015 20:26 02/25/2015 07:39 02/25/2015 12:09  Glucose-Capillary Latest Ref Range: 65-99 mg/dL 373 (H) 347 (H) 207 (H) 216 (H) 285 (H)  Needs insulin adjustment.  Inpatient Diabetes Program Recommendations:  Insulin - Basal: Increase Lantus to 35 units QHS Insulin - Meal Coverage: Increase Novolog to 15 units tidwc if pt eats > 50% meal  Will continue to follow. Thank you. Lorenda Peck, RD, LDN, CDE Inpatient Diabetes Coordinator [phone]

## 2015-02-25 NOTE — Telephone Encounter (Signed)
Call from son, Mikki Santee requesting to discuss patient's status and options with physician.  Message forwarded to MD.

## 2015-02-25 NOTE — Progress Notes (Signed)
[facility] PROGRESS NOTE  Amber Bishop:[id number] DOB: 10-17-1950 DOA: 02/24/2015 PCP: [name], MD  Brief Summary  The patient is a 64 y.o. year-old female with history of pancreatic cancer followed by Dr. Burr Medico, hypertension, anxiety, diabetes mellitus type 2 who presents with nausea and vomiting. She was diagnosed with pancreatic cancer after she had a CT scan which demonstrated of pancreatic mass in March 2016. She completed 4 cycles of mFOLFOX but had progression of disease so she was started on second line chemotherapy with gemcitabine and Abraxane in June 2016. In the same month, she had a 1-1/2 week hospitalization for right lower extremity DVT and she was started on Coumadin. In September, CT scan demonstrated stable disease.   Over the last 1-2 months, she has had an 8 pound weight loss with early satiety, nausea, abdominal distension.  Se has had severe 10 out of 10 back pain radiating from her lower cervical spine down to her upper lumbar area the prevention her from sleeping.  She has also had approximate 2 months of progressive epigastric abdominal pain.  She presented to the hospital because of nausea and vomiting.  In the ER, her labs were concerning for obstructive jaundice, INR of 8.  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  She appears to have progression of disease now with obstruction of biliary ducts.    Assessment/Plan  Progressive metastatic pancreatic cancer, failed two lines of chemotherapy.  -  Plan for home hospice at discharge -  Appreciate palliative care and oncology assistance  Nausea and vomiting with abdominal pain are likely secondary to biliary  obstruction with elevated transaminitis/bilirubin.  LFTs mildly lower but bilirubin higher - IR assistance appreciated - Addressing her elevated INR before any procedures - Continue Unasyn -  Antiemetics and IV fluids  - Continue IV Dilaudid when necessary  Elevated INR secondary to Coumadin therapy in the setting of progressive liver metastases - She is not safe for ongoing Coumadin therapy - Administer vitamin K 10 mg daily until INR wnl - Repeat INR in a.m.  Type 2 diabetes mellitus with hyperglycemia, hyperglycemic - Not eating or drinking reliably - Increase to Lantus 40 units daily at bedtime - Continue low-dose sliding scale insulin  -  Increase standing aspart to 15 units with meals  Essential hypertension, with elevated blood pressure  - Hold HCTZ and ARB secondary to dehydration  - Continue Norvasc   DVT, occurred in June of this year, needs ongoing anticoagulation and she is going be aggressive since this was triggered by underlying malignancy - Hold anticoagulation for now given elevated INR - Heparin drip to be started when INR less than 2 prior to her biliary drain placement - Will likely need Lovenox at discharge depending on goals of care  Depression/anxiety, pressured speech - Scheduled Xanax per her home schedule, continue fluoxetine - When necessary Ativan added by palliative care   Diet:  Diabetic diet Access: Port IVF:  Yes Proph: currently supratherapeutic INR  Code Status: DO NOT RESUSCITATE Family Communication: Patient alone Disposition Plan:  Anticipate home with hospice pending INR wnl with alternative anticoagulation unless she would like full comfort measures and possibly a biliary drain.    Consultants:  Medical Oncology, Dr. Burr Medico  Palliative care, Dr.  Hilma Favors  IR  Procedures:  CT chest, abdomen, pelvis  Antibiotics:  Unasyn 10/6 >   HPI/Subjective:  Feels better today, more energy and no vomiting.  Still  has nausea with early satiety    Objective: Filed Vitals:   02/24/15 2020 02/24/15 2029 02/25/15 0608 02/25/15 1407  BP:  135/80 132/82 148/75  Pulse: 80 88 91 79  Temp:  98.1 F (36.7 C) 97.9 F (36.6 C) 98.3 F (36.8 C)  TempSrc:  Oral Oral Oral  Resp:  18 18 16   Height:      Weight:      SpO2:  97% 98% 99%    Intake/Output Summary (Last 24 hours) at 02/25/15 1423 Last data filed at 02/25/15 1407  Gross per 24 hour  Intake 2536.33 ml  Output    600 ml  Net 1936.33 ml   Filed Weights   02/24/15 1218  Weight: 97.07 kg (214 lb)   Body mass index is 36.72 kg/(m^2).  Exam:   General:  Adult female, mild jaundice and scleral icterus, No acute distress  HEENT:  NCAT, MMM  Cardiovascular:  RRR, nl S1, S2 no mrg, 2+ pulses, warm extremities  Respiratory:  CTAB, no increased WOB  Abdomen:   NABS, soft, moderately distended, TTP without rebound or guarding  MSK:   Normal tone and bulk, no LEE  Neuro:  Grossly intact, ambulating in halls  Data Reviewed: Basic Metabolic Panel:  Recent Labs Lab 02/24/15 0423 02/25/15 0447  NA 136 139  K 3.8 3.8  CL 99* 104  CO2 27 29  GLUCOSE 267* 238*  BUN 14 11  CREATININE 0.40* 0.30*  CALCIUM 8.4* 8.0*   Liver Function Tests:  Recent Labs Lab 02/24/15 0423 02/25/15 0447  AST 180* 153*  ALT 190* 170*  ALKPHOS 366* 337*  BILITOT 7.1* 8.5*  PROT 6.1* 5.4*  ALBUMIN 3.3* 2.8*    Recent Labs Lab 02/24/15 0423  LIPASE 46   No results for input(s): AMMONIA in the last 168 hours. CBC:  Recent Labs Lab 02/24/15 0423 02/25/15 0447  WBC 8.5 7.4  NEUTROABS 6.3  --   HGB 12.5 12.0  HCT 38.1 36.9  MCV 88.6 88.7  PLT 187 195    Recent Results (from the past 240 hour(s))  Urine culture     Status: None   Collection Time: 02/24/15  4:59 AM  Result Value Ref Range Status   Specimen Description URINE, CLEAN CATCH  Final   Special Requests Normal  Final   Culture   Final    MULTIPLE SPECIES PRESENT, SUGGEST  RECOLLECTION Performed at [facility]    Report Status 02/25/2015 FINAL  Final     Studies: Ct Chest W Contrast  02/24/2015   CLINICAL DATA:  64 year old female with known metastatic pancreatic cancer and new symptoms of abdominal pain, nausea and vomiting  EXAM: CT CHEST, ABDOMEN, AND PELVIS WITH CONTRAST  TECHNIQUE: Multidetector CT imaging of the chest, abdomen and pelvis was performed following the standard protocol during bolus administration of intravenous contrast.  CONTRAST:  121mL OMNIPAQUE IOHEXOL 300 MG/ML  SOLN  COMPARISON:  Most recent CT abdomen/pelvis 12/27/2014; prior CT scan of the chest 10/26/2014  FINDINGS: CT CHEST FINDINGS  Mediastinum/Nodes: Unremarkable CT appearance of the thyroid gland. No suspicious mediastinal or hilar adenopathy. No soft tissue mediastinal mass. The thoracic esophagus is unremarkable.  Lungs/Pleura: Scattered foci of ground-glass attenuation opacity in the right upper lobe and along the major fissure are similar to slightly  more conspicuous than previously seen. There are 2 subpleural nodules within the major fissure which were previously barely detectable but have since enlarged measuring up to 4 mm. No definite new intraparenchymal pulmonary nodule. No new airspace consolidation, pulmonary edema, pleural effusion or pneumothorax. Right IJ approach single-lumen power injectable port catheter. Catheter tip terminates at the superior cavoatrial junction. No evidence of aneurysm. The heart is within normal limits for size. No pericardial effusion.  Musculoskeletal: Stable sclerotic focus in the superior endplate of T4. The sclerotic focus in the lateral aspect of the L1 vertebral body has enlarged compared 12/27/2014. The lesion now measures up to 1.5 cm compared to 1.0 cm previously. No acute fracture.  CT ABDOMEN PELVIS FINDINGS  Hepatobiliary: Similar degree of left-sided biliary ductal dilatation. The segments 2 and 3 biliary ducts appear obstructed by  an a amorphous hepatic metastasis. Newly developing central biliary ductal dilatation in the anterior and posterior segmental ducts of the right hepatic lobe. This was not previously present. There are innumerable hepatic lesions bilaterally which are enlarging. An index lesion within the right hepatic lobe has increased from approximately 1.8 cm to 2.5 cm. A second index lesion in the anterior aspect of hepatic segment 5 now measures 2.7 cm compared to 2.1 cm previously. A lesion in the deep aspect of hepatic segment 4B is similar at 2.8 cm compared to 2.7 cm previously. A lesion in the periphery of the liver at the interface of segment 5 and 6 now measures 3.5 cm compared to 2.3 cm. Multiple other lesions also demonstrate interval enlargement.  Interval development of mild perihepatic ascites. The gallbladder is decompressed.  Pancreas: An amorphous slightly hypoenhancing mass in the body of the pancreas is less well-defined than on prior imaging and difficult to measure. The mass measures approximately 4.8 x 3.9 cm compared to 5.0 x 3.1 cm. It appears slightly more full. The pancreatic tail distal to this lesion is atrophic with mild ductal dilatation. This is similar compared to prior.  Spleen: No focal splenic abnormality.  Trace perisplenic ascites.  Adrenals/Urinary Tract: The adrenal glands are within normal limits. No enhancing renal mass, hydronephrosis or nephrolithiasis. Stable 5.3 cm cyst exophytic from the lower pole of the right kidney.  Stomach/Bowel: Unremarkable CT appearance of the stomach, duodenum, and intestine. No focal bowel wall thickening or evidence of obstruction. Colonic diverticular disease without CT evidence of active inflammation. Mild stranding is present within the small bowel mesentery and ascending colon mesial colon. This was not previously present.  Vascular/Lymphatic: Enlarging para-aortic lymph nodes. A right celiac station node now measures 1.4 x 0.8 cm compared to being  barely detectable at a maximum of 7 mm previously. This node is adjacent to the left adrenal gland on image 59 of series 3.  Reproductive: Unremarkable bladder, uterus and adnexa. Small free fluid within the pelvis.  Other: Subtle enhancing nodularity along the peritoneal surface adjacent to the liver in the right perihepatic space (images 65, 71, 72, 73 and 75 of series 3).  Musculoskeletal: Stable mixed lytic and sclerotic lesion in the left femoral head. New sclerotic metastatic lesion in the inferior left ischial tuberosity measuring 7 mm. Slightly enlarged mixed lytic and sclerotic lesion in the posterior aspect of the L2 vertebral body. This is best demonstrated on the sagittal reformatted images were the lesion measures 2 cm. Stable reticulation of the subcutaneous fat with multiple small nodules and focal skin thickening. This is not significantly different compared to prior.  IMPRESSION: 1. There has  been interval progression of metastatic disease compared 12/27/2014. Specifically, there has been interval enlargement of numerous hepatic metastatic lesions as well as interval development of small volume ascites and subtle nodularity of the peritoneal surface in the perihepatic space and throughout the mesentery concerning for peritoneal carcinomatosis. Additionally, there is increasing retroperitoneal adenopathy and progressive osseous metastatic disease at L1, L2 and in the left ischial tuberosity. 2. Slightly increased subpleural nodularity along the major fissure of the right lung may represent increasing prominence of subpleural lymph nodes due to an underlying infectious/inflammatory process, or an additional site of progressive metastatic disease. 3. New developing biliary ductal dilatation of the central anterior and posterior segmental bile ducts in the right biliary tree. These were previously normal. The left intrahepatic biliary ductal dilatation remains stable. 4. Similar to slightly enlarged  amorphous hypoenhancing mass in the body of the pancreas consistent with the primary pancreatic neoplasm. 5. Colonic diverticular disease without CT evidence of active inflammation. 6. Stable skin thickening with reticulation of the underlying subcutaneous fat and nodularity in the right lower quadrant anterior abdominal wall. This may represent reactive changes if the patient is undergoing subcutaneous medicine injections. If there are no injections in this region, this could represent a site of cutaneous metastatic disease.   Electronically Signed   By: Jacqulynn Cadet M.D.   On: 02/24/2015 08:14   Ct Abdomen Pelvis W Contrast  02/24/2015   CLINICAL DATA:  64 year old female with known metastatic pancreatic cancer and new symptoms of abdominal pain, nausea and vomiting  EXAM: CT CHEST, ABDOMEN, AND PELVIS WITH CONTRAST  TECHNIQUE: Multidetector CT imaging of the chest, abdomen and pelvis was performed following the standard protocol during bolus administration of intravenous contrast.  CONTRAST:  165mL OMNIPAQUE IOHEXOL 300 MG/ML  SOLN  COMPARISON:  Most recent CT abdomen/pelvis 12/27/2014; prior CT scan of the chest 10/26/2014  FINDINGS: CT CHEST FINDINGS  Mediastinum/Nodes: Unremarkable CT appearance of the thyroid gland. No suspicious mediastinal or hilar adenopathy. No soft tissue mediastinal mass. The thoracic esophagus is unremarkable.  Lungs/Pleura: Scattered foci of ground-glass attenuation opacity in the right upper lobe and along the major fissure are similar to slightly more conspicuous than previously seen. There are 2 subpleural nodules within the major fissure which were previously barely detectable but have since enlarged measuring up to 4 mm. No definite new intraparenchymal pulmonary nodule. No new airspace consolidation, pulmonary edema, pleural effusion or pneumothorax. Right IJ approach single-lumen power injectable port catheter. Catheter tip terminates at the superior cavoatrial  junction. No evidence of aneurysm. The heart is within normal limits for size. No pericardial effusion.  Musculoskeletal: Stable sclerotic focus in the superior endplate of T4. The sclerotic focus in the lateral aspect of the L1 vertebral body has enlarged compared 12/27/2014. The lesion now measures up to 1.5 cm compared to 1.0 cm previously. No acute fracture.  CT ABDOMEN PELVIS FINDINGS  Hepatobiliary: Similar degree of left-sided biliary ductal dilatation. The segments 2 and 3 biliary ducts appear obstructed by an a amorphous hepatic metastasis. Newly developing central biliary ductal dilatation in the anterior and posterior segmental ducts of the right hepatic lobe. This was not previously present. There are innumerable hepatic lesions bilaterally which are enlarging. An index lesion within the right hepatic lobe has increased from approximately 1.8 cm to 2.5 cm. A second index lesion in the anterior aspect of hepatic segment 5 now measures 2.7 cm compared to 2.1 cm previously. A lesion in the deep aspect of hepatic segment 4B is  similar at 2.8 cm compared to 2.7 cm previously. A lesion in the periphery of the liver at the interface of segment 5 and 6 now measures 3.5 cm compared to 2.3 cm. Multiple other lesions also demonstrate interval enlargement.  Interval development of mild perihepatic ascites. The gallbladder is decompressed.  Pancreas: An amorphous slightly hypoenhancing mass in the body of the pancreas is less well-defined than on prior imaging and difficult to measure. The mass measures approximately 4.8 x 3.9 cm compared to 5.0 x 3.1 cm. It appears slightly more full. The pancreatic tail distal to this lesion is atrophic with mild ductal dilatation. This is similar compared to prior.  Spleen: No focal splenic abnormality.  Trace perisplenic ascites.  Adrenals/Urinary Tract: The adrenal glands are within normal limits. No enhancing renal mass, hydronephrosis or nephrolithiasis. Stable 5.3 cm cyst  exophytic from the lower pole of the right kidney.  Stomach/Bowel: Unremarkable CT appearance of the stomach, duodenum, and intestine. No focal bowel wall thickening or evidence of obstruction. Colonic diverticular disease without CT evidence of active inflammation. Mild stranding is present within the small bowel mesentery and ascending colon mesial colon. This was not previously present.  Vascular/Lymphatic: Enlarging para-aortic lymph nodes. A right celiac station node now measures 1.4 x 0.8 cm compared to being barely detectable at a maximum of 7 mm previously. This node is adjacent to the left adrenal gland on image 59 of series 3.  Reproductive: Unremarkable bladder, uterus and adnexa. Small free fluid within the pelvis.  Other: Subtle enhancing nodularity along the peritoneal surface adjacent to the liver in the right perihepatic space (images 65, 71, 72, 73 and 75 of series 3).  Musculoskeletal: Stable mixed lytic and sclerotic lesion in the left femoral head. New sclerotic metastatic lesion in the inferior left ischial tuberosity measuring 7 mm. Slightly enlarged mixed lytic and sclerotic lesion in the posterior aspect of the L2 vertebral body. This is best demonstrated on the sagittal reformatted images were the lesion measures 2 cm. Stable reticulation of the subcutaneous fat with multiple small nodules and focal skin thickening. This is not significantly different compared to prior.  IMPRESSION: 1. There has been interval progression of metastatic disease compared 12/27/2014. Specifically, there has been interval enlargement of numerous hepatic metastatic lesions as well as interval development of small volume ascites and subtle nodularity of the peritoneal surface in the perihepatic space and throughout the mesentery concerning for peritoneal carcinomatosis. Additionally, there is increasing retroperitoneal adenopathy and progressive osseous metastatic disease at L1, L2 and in the left ischial  tuberosity. 2. Slightly increased subpleural nodularity along the major fissure of the right lung may represent increasing prominence of subpleural lymph nodes due to an underlying infectious/inflammatory process, or an additional site of progressive metastatic disease. 3. New developing biliary ductal dilatation of the central anterior and posterior segmental bile ducts in the right biliary tree. These were previously normal. The left intrahepatic biliary ductal dilatation remains stable. 4. Similar to slightly enlarged amorphous hypoenhancing mass in the body of the pancreas consistent with the primary pancreatic neoplasm. 5. Colonic diverticular disease without CT evidence of active inflammation. 6. Stable skin thickening with reticulation of the underlying subcutaneous fat and nodularity in the right lower quadrant anterior abdominal wall. This may represent reactive changes if the patient is undergoing subcutaneous medicine injections. If there are no injections in this region, this could represent a site of cutaneous metastatic disease.   Electronically Signed   By: Dellis Filbert.D.  On: 02/24/2015 08:14    Scheduled Meds: . ALPRAZolam  0.5 mg Oral TID  . amLODipine  5 mg Oral q morning - 10a  . ampicillin-sulbactam (UNASYN) IV  3 g Intravenous Q8H  . FLUoxetine  20 mg Oral q morning - 10a  . insulin aspart  0-5 Units Subcutaneous QHS  . insulin aspart  0-9 Units Subcutaneous TID WC  . insulin aspart  10 Units Subcutaneous TID WC  . insulin glargine  30 Units Subcutaneous QHS  . multivitamin with minerals   Oral q morning - 10a  . ondansetron (ZOFRAN) IV  4 mg Intravenous 3 times per day   Continuous Infusions: . sodium chloride 100 mL/hr at 02/25/15 0542    Active Problems:   Diabetes mellitus type 2, uncontrolled, without complications (HCC)   Pancreatic cancer metastasized to liver [facility])   Primary pancreatic cancer with metastasis to other site [facility])   DVT (deep venous  thrombosis) (HCC)   Nausea and vomiting   Biliary obstruction   Malignant neoplasm of pancreas ([place])    Time spent: 30 min    Dwane Andres, [facility] Pager [phone]. If 7PM-7AM, please contact night-coverage at [email or web address], password [facility] 02/25/2015, 2:23 PM  LOS: 1 day

## 2015-02-25 NOTE — Progress Notes (Signed)
INR 8.2 today --paged NP Baltazar Najjar

## 2015-02-25 NOTE — Progress Notes (Signed)
Amber Bishop   DOB:[birth year]   YK#:[id number]   BLT#:[id number]  Subjective: I saw Amber Bishop again in the late afternoon today. His boy friend Amber Bishop has left. She has intermittent epigastric and back pain, but overall controlled by meds. She walked with therapist today, was ordering dinner when I saw her. She overall is in good sprits.    Objective:  Filed Vitals:   02/25/15 2208  BP: 143/77  Pulse: 84  Temp: 98 F (36.7 C)  Resp: 16    Body mass index is 36.72 kg/(m^2).  Intake/Output Summary (Last 24 hours) at 02/25/15 2348 Last data filed at 02/25/15 2215  Gross per 24 hour  Intake   3140 ml  Output    900 ml  Net   2240 ml     Sclerae and skin (+) jaundice   Oropharynx clear  No peripheral adenopathy  Lungs clear -- no rales or rhonchi  Heart regular rate and rhythm  Abdomen benign  MSK no focal spinal tenderness, no peripheral edema  Neuro nonfocal    CBG (last 3)   Recent Labs  02/25/15 1209 02/25/15 1737 02/25/15 2142  GLUCAP 285* 171* 188*     Labs:  Lab Results  Component Value Date   WBC 7.4 02/25/2015   HGB 12.0 02/25/2015   HCT 36.9 02/25/2015   MCV 88.7 02/25/2015   PLT 195 02/25/2015   NEUTROABS 6.3 02/24/2015    @LASTCHEMISTRY @  Urine Studies No results for input(s): UHGB, CRYS in the last 72 hours.  Invalid input(s): UACOL, UAPR, USPG, UPH, UTP, UGL, UKET, UBIL, UNIT, UROB, ULEU, UEPI, UWBC, URBC, UBAC, CAST, UCOM, BILUA  Basic Metabolic Panel:  Recent Labs Lab 02/24/15 0423 02/25/15 0447  NA 136 139  K 3.8 3.8  CL 99* 104  CO2 27 29  GLUCOSE 267* 238*  BUN 14 11  CREATININE 0.40* 0.30*  CALCIUM 8.4* 8.0*   GFR Estimated Creatinine Clearance: 78.3 mL/min (by C-G formula based on Cr of 0.3). Liver Function Tests:  Recent Labs Lab 02/24/15 0423 02/25/15 0447  AST 180* 153*  ALT 190* 170*  ALKPHOS 366* 337*  BILITOT 7.1* 8.5*  PROT 6.1* 5.4*  ALBUMIN 3.3* 2.8*    Recent Labs Lab 02/24/15 0423  LIPASE 46   No  results for input(s): AMMONIA in the last 168 hours. Coagulation profile  Recent Labs Lab 02/24/15 1255 02/25/15 0447  INR 8.00* 8.21*    CBC:  Recent Labs Lab 02/24/15 0423 02/25/15 0447  WBC 8.5 7.4  NEUTROABS 6.3  --   HGB 12.5 12.0  HCT 38.1 36.9  MCV 88.6 88.7  PLT 187 195   Cardiac Enzymes: No results for input(s): CKTOTAL, CKMB, CKMBINDEX, TROPONINI in the last 168 hours. BNP: Invalid input(s): POCBNP CBG:  Recent Labs Lab 02/24/15 2026 02/25/15 0739 02/25/15 1209 02/25/15 1737 02/25/15 2142  GLUCAP 207* 216* 285* 171* 188*   D-Dimer No results for input(s): DDIMER in the last 72 hours. Hgb A1c No results for input(s): HGBA1C in the last 72 hours. Lipid Profile No results for input(s): CHOL, HDL, LDLCALC, TRIG, CHOLHDL, LDLDIRECT in the last 72 hours. Thyroid function studies No results for input(s): TSH, T4TOTAL, T3FREE, THYROIDAB in the last 72 hours.  Invalid input(s): FREET3 Anemia work up No results for input(s): VITAMINB12, FOLATE, FERRITIN, TIBC, IRON, RETICCTPCT in the last 72 hours. Microbiology Recent Results (from the past 240 hour(s))  Urine culture     Status: None   Collection Time: 02/24/15  4:59  AM  Result Value Ref Range Status   Specimen Description URINE, CLEAN CATCH  Final   Special Requests Normal  Final   Culture   Final    MULTIPLE SPECIES PRESENT, SUGGEST RECOLLECTION Performed at [facility]    Report Status 02/25/2015 FINAL  Final      Studies:  Ct Chest W Contrast  02/24/2015   CLINICAL DATA:  64 year old female with known metastatic pancreatic cancer and new symptoms of abdominal pain, nausea and vomiting  EXAM: CT CHEST, ABDOMEN, AND PELVIS WITH CONTRAST  TECHNIQUE: Multidetector CT imaging of the chest, abdomen and pelvis was performed following the standard protocol during bolus administration of intravenous contrast.  CONTRAST:  158mL OMNIPAQUE IOHEXOL 300 MG/ML  SOLN  COMPARISON:  Most recent CT  abdomen/pelvis 12/27/2014; prior CT scan of the chest 10/26/2014  FINDINGS: CT CHEST FINDINGS  Mediastinum/Nodes: Unremarkable CT appearance of the thyroid gland. No suspicious mediastinal or hilar adenopathy. No soft tissue mediastinal mass. The thoracic esophagus is unremarkable.  Lungs/Pleura: Scattered foci of ground-glass attenuation opacity in the right upper lobe and along the major fissure are similar to slightly more conspicuous than previously seen. There are 2 subpleural nodules within the major fissure which were previously barely detectable but have since enlarged measuring up to 4 mm. No definite new intraparenchymal pulmonary nodule. No new airspace consolidation, pulmonary edema, pleural effusion or pneumothorax. Right IJ approach single-lumen power injectable port catheter. Catheter tip terminates at the superior cavoatrial junction. No evidence of aneurysm. The heart is within normal limits for size. No pericardial effusion.  Musculoskeletal: Stable sclerotic focus in the superior endplate of T4. The sclerotic focus in the lateral aspect of the L1 vertebral body has enlarged compared 12/27/2014. The lesion now measures up to 1.5 cm compared to 1.0 cm previously. No acute fracture.  CT ABDOMEN PELVIS FINDINGS  Hepatobiliary: Similar degree of left-sided biliary ductal dilatation. The segments 2 and 3 biliary ducts appear obstructed by an a amorphous hepatic metastasis. Newly developing central biliary ductal dilatation in the anterior and posterior segmental ducts of the right hepatic lobe. This was not previously present. There are innumerable hepatic lesions bilaterally which are enlarging. An index lesion within the right hepatic lobe has increased from approximately 1.8 cm to 2.5 cm. A second index lesion in the anterior aspect of hepatic segment 5 now measures 2.7 cm compared to 2.1 cm previously. A lesion in the deep aspect of hepatic segment 4B is similar at 2.8 cm compared to 2.7 cm  previously. A lesion in the periphery of the liver at the interface of segment 5 and 6 now measures 3.5 cm compared to 2.3 cm. Multiple other lesions also demonstrate interval enlargement.  Interval development of mild perihepatic ascites. The gallbladder is decompressed.  Pancreas: An amorphous slightly hypoenhancing mass in the body of the pancreas is less well-defined than on prior imaging and difficult to measure. The mass measures approximately 4.8 x 3.9 cm compared to 5.0 x 3.1 cm. It appears slightly more full. The pancreatic tail distal to this lesion is atrophic with mild ductal dilatation. This is similar compared to prior.  Spleen: No focal splenic abnormality.  Trace perisplenic ascites.  Adrenals/Urinary Tract: The adrenal glands are within normal limits. No enhancing renal mass, hydronephrosis or nephrolithiasis. Stable 5.3 cm cyst exophytic from the lower pole of the right kidney.  Stomach/Bowel: Unremarkable CT appearance of the stomach, duodenum, and intestine. No focal bowel wall thickening or evidence of obstruction. Colonic diverticular disease  without CT evidence of active inflammation. Mild stranding is present within the small bowel mesentery and ascending colon mesial colon. This was not previously present.  Vascular/Lymphatic: Enlarging para-aortic lymph nodes. A right celiac station node now measures 1.4 x 0.8 cm compared to being barely detectable at a maximum of 7 mm previously. This node is adjacent to the left adrenal gland on image 59 of series 3.  Reproductive: Unremarkable bladder, uterus and adnexa. Small free fluid within the pelvis.  Other: Subtle enhancing nodularity along the peritoneal surface adjacent to the liver in the right perihepatic space (images 65, 71, 72, 73 and 75 of series 3).  Musculoskeletal: Stable mixed lytic and sclerotic lesion in the left femoral head. New sclerotic metastatic lesion in the inferior left ischial tuberosity measuring 7 mm. Slightly enlarged  mixed lytic and sclerotic lesion in the posterior aspect of the L2 vertebral body. This is best demonstrated on the sagittal reformatted images were the lesion measures 2 cm. Stable reticulation of the subcutaneous fat with multiple small nodules and focal skin thickening. This is not significantly different compared to prior.  IMPRESSION: 1. There has been interval progression of metastatic disease compared 12/27/2014. Specifically, there has been interval enlargement of numerous hepatic metastatic lesions as well as interval development of small volume ascites and subtle nodularity of the peritoneal surface in the perihepatic space and throughout the mesentery concerning for peritoneal carcinomatosis. Additionally, there is increasing retroperitoneal adenopathy and progressive osseous metastatic disease at L1, L2 and in the left ischial tuberosity. 2. Slightly increased subpleural nodularity along the major fissure of the right lung may represent increasing prominence of subpleural lymph nodes due to an underlying infectious/inflammatory process, or an additional site of progressive metastatic disease. 3. New developing biliary ductal dilatation of the central anterior and posterior segmental bile ducts in the right biliary tree. These were previously normal. The left intrahepatic biliary ductal dilatation remains stable. 4. Similar to slightly enlarged amorphous hypoenhancing mass in the body of the pancreas consistent with the primary pancreatic neoplasm. 5. Colonic diverticular disease without CT evidence of active inflammation. 6. Stable skin thickening with reticulation of the underlying subcutaneous fat and nodularity in the right lower quadrant anterior abdominal wall. This may represent reactive changes if the patient is undergoing subcutaneous medicine injections. If there are no injections in this region, this could represent a site of cutaneous metastatic disease.   Electronically Signed   By: Jacqulynn Cadet M.D.   On: 02/24/2015 08:14   Ct Abdomen Pelvis W Contrast  02/24/2015   CLINICAL DATA:  64 year old female with known metastatic pancreatic cancer and new symptoms of abdominal pain, nausea and vomiting  EXAM: CT CHEST, ABDOMEN, AND PELVIS WITH CONTRAST  TECHNIQUE: Multidetector CT imaging of the chest, abdomen and pelvis was performed following the standard protocol during bolus administration of intravenous contrast.  CONTRAST:  130mL OMNIPAQUE IOHEXOL 300 MG/ML  SOLN  COMPARISON:  Most recent CT abdomen/pelvis 12/27/2014; prior CT scan of the chest 10/26/2014  FINDINGS: CT CHEST FINDINGS  Mediastinum/Nodes: Unremarkable CT appearance of the thyroid gland. No suspicious mediastinal or hilar adenopathy. No soft tissue mediastinal mass. The thoracic esophagus is unremarkable.  Lungs/Pleura: Scattered foci of ground-glass attenuation opacity in the right upper lobe and along the major fissure are similar to slightly more conspicuous than previously seen. There are 2 subpleural nodules within the major fissure which were previously barely detectable but have since enlarged measuring up to 4 mm. No definite new intraparenchymal pulmonary nodule.  No new airspace consolidation, pulmonary edema, pleural effusion or pneumothorax. Right IJ approach single-lumen power injectable port catheter. Catheter tip terminates at the superior cavoatrial junction. No evidence of aneurysm. The heart is within normal limits for size. No pericardial effusion.  Musculoskeletal: Stable sclerotic focus in the superior endplate of T4. The sclerotic focus in the lateral aspect of the L1 vertebral body has enlarged compared 12/27/2014. The lesion now measures up to 1.5 cm compared to 1.0 cm previously. No acute fracture.  CT ABDOMEN PELVIS FINDINGS  Hepatobiliary: Similar degree of left-sided biliary ductal dilatation. The segments 2 and 3 biliary ducts appear obstructed by an a amorphous hepatic metastasis. Newly developing  central biliary ductal dilatation in the anterior and posterior segmental ducts of the right hepatic lobe. This was not previously present. There are innumerable hepatic lesions bilaterally which are enlarging. An index lesion within the right hepatic lobe has increased from approximately 1.8 cm to 2.5 cm. A second index lesion in the anterior aspect of hepatic segment 5 now measures 2.7 cm compared to 2.1 cm previously. A lesion in the deep aspect of hepatic segment 4B is similar at 2.8 cm compared to 2.7 cm previously. A lesion in the periphery of the liver at the interface of segment 5 and 6 now measures 3.5 cm compared to 2.3 cm. Multiple other lesions also demonstrate interval enlargement.  Interval development of mild perihepatic ascites. The gallbladder is decompressed.  Pancreas: An amorphous slightly hypoenhancing mass in the body of the pancreas is less well-defined than on prior imaging and difficult to measure. The mass measures approximately 4.8 x 3.9 cm compared to 5.0 x 3.1 cm. It appears slightly more full. The pancreatic tail distal to this lesion is atrophic with mild ductal dilatation. This is similar compared to prior.  Spleen: No focal splenic abnormality.  Trace perisplenic ascites.  Adrenals/Urinary Tract: The adrenal glands are within normal limits. No enhancing renal mass, hydronephrosis or nephrolithiasis. Stable 5.3 cm cyst exophytic from the lower pole of the right kidney.  Stomach/Bowel: Unremarkable CT appearance of the stomach, duodenum, and intestine. No focal bowel wall thickening or evidence of obstruction. Colonic diverticular disease without CT evidence of active inflammation. Mild stranding is present within the small bowel mesentery and ascending colon mesial colon. This was not previously present.  Vascular/Lymphatic: Enlarging para-aortic lymph nodes. A right celiac station node now measures 1.4 x 0.8 cm compared to being barely detectable at a maximum of 7 mm previously. This  node is adjacent to the left adrenal gland on image 59 of series 3.  Reproductive: Unremarkable bladder, uterus and adnexa. Small free fluid within the pelvis.  Other: Subtle enhancing nodularity along the peritoneal surface adjacent to the liver in the right perihepatic space (images 65, 71, 72, 73 and 75 of series 3).  Musculoskeletal: Stable mixed lytic and sclerotic lesion in the left femoral head. New sclerotic metastatic lesion in the inferior left ischial tuberosity measuring 7 mm. Slightly enlarged mixed lytic and sclerotic lesion in the posterior aspect of the L2 vertebral body. This is best demonstrated on the sagittal reformatted images were the lesion measures 2 cm. Stable reticulation of the subcutaneous fat with multiple small nodules and focal skin thickening. This is not significantly different compared to prior.  IMPRESSION: 1. There has been interval progression of metastatic disease compared 12/27/2014. Specifically, there has been interval enlargement of numerous hepatic metastatic lesions as well as interval development of small volume ascites and subtle nodularity of the peritoneal  surface in the perihepatic space and throughout the mesentery concerning for peritoneal carcinomatosis. Additionally, there is increasing retroperitoneal adenopathy and progressive osseous metastatic disease at L1, L2 and in the left ischial tuberosity. 2. Slightly increased subpleural nodularity along the major fissure of the right lung may represent increasing prominence of subpleural lymph nodes due to an underlying infectious/inflammatory process, or an additional site of progressive metastatic disease. 3. New developing biliary ductal dilatation of the central anterior and posterior segmental bile ducts in the right biliary tree. These were previously normal. The left intrahepatic biliary ductal dilatation remains stable. 4. Similar to slightly enlarged amorphous hypoenhancing mass in the body of the pancreas  consistent with the primary pancreatic neoplasm. 5. Colonic diverticular disease without CT evidence of active inflammation. 6. Stable skin thickening with reticulation of the underlying subcutaneous fat and nodularity in the right lower quadrant anterior abdominal wall. This may represent reactive changes if the patient is undergoing subcutaneous medicine injections. If there are no injections in this region, this could represent a site of cutaneous metastatic disease.   Electronically Signed   By: Jacqulynn Cadet M.D.   On: 02/24/2015 08:14    Assessment: 64 y.o. with metastatic pancreatic cancer, currently on second line chemotherapy  1. 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 Obstructive jaundice, secondary to liver metastasis, tbil trending up  3. History of DVT, was on coumadin, held due to dupertherapeutic INR, received vitK  4. DM, HTN 5. Malnutrition  6. Abdominal and back pain secondary to metastatic cancer    Plan:  -appreciate palliative care consult, goal of care was discussed again, pt is more receptive to hospice now -She expressed her wishes to preserve her quality of life, and believe God will guild her -per her request, I called and discussed with her son, who lives in [place], and plan to come to see her next Friday  -continue supportive care -she has not decided about her bili drainage placement and hospice yet, will give her more time to think about it. -she is on Unasyn, urine cultures are negative, no strong clinical evidence of cholangitis   I will follow along with she is in the hospital.    Truitt Merle, MD 02/25/2015  11:48 PM

## 2015-02-25 NOTE — Progress Notes (Signed)
I met with Amber Bishop today to discuss her goal of care. She is extremely anxious and often her speech is very pressured. I suspect this is her baseline- she appears to be very outgoing in general as she got multiple calls from friends and neighbors while I was in the room. She was very expressive about her worries-she refers to god healing her in terms of dying and also in terms of living- she is struggling to accept her prognosis and demonstrating projection and denial coping, but she is able to describe her choices and scenarios in detail. She lives alone, her social situation is complex- she has a boyfriend, she has a son who travels for his job and lives in [place], she is worried about how she will handle her responsibilities at home- she is still driving and going to [employer] and is worried about how her life will go on- she doesn't want to just "lay down and die". I encouraged her to take one day at a time.   I explained hospice services- she is agreeable to these services at discharge and has many question about the kind of care Hospice can provide. She asked that I call her son- I called his cell phone number listed on the chart with unidentified VM reached.  Pain and nausea are improved today- we will continue to follow for needs.  Lane Hacker, DO Palliative Medicine [phone]

## 2015-02-26 DIAGNOSIS — R112 Nausea with vomiting, unspecified: Secondary | ICD-10-CM

## 2015-02-26 LAB — COMPREHENSIVE METABOLIC PANEL
ALT: 150 U/L — AB (ref 14–54)
AST: 146 U/L — ABNORMAL HIGH (ref 15–41)
Albumin: 2.5 g/dL — ABNORMAL LOW (ref 3.5–5.0)
Alkaline Phosphatase: 312 U/L — ABNORMAL HIGH (ref 38–126)
Anion gap: 6 (ref 5–15)
BILIRUBIN TOTAL: 9.6 mg/dL — AB (ref 0.3–1.2)
BUN: 11 mg/dL (ref 6–20)
CHLORIDE: 106 mmol/L (ref 101–111)
CO2: 27 mmol/L (ref 22–32)
CREATININE: 0.42 mg/dL — AB (ref 0.44–1.00)
Calcium: 7.6 mg/dL — ABNORMAL LOW (ref 8.9–10.3)
Glucose, Bld: 215 mg/dL — ABNORMAL HIGH (ref 65–99)
Potassium: 3.8 mmol/L (ref 3.5–5.1)
Sodium: 139 mmol/L (ref 135–145)
TOTAL PROTEIN: 4.9 g/dL — AB (ref 6.5–8.1)

## 2015-02-26 LAB — GLUCOSE, CAPILLARY
GLUCOSE-CAPILLARY: 213 mg/dL — AB (ref 65–99)
GLUCOSE-CAPILLARY: 243 mg/dL — AB (ref 65–99)
Glucose-Capillary: 215 mg/dL — ABNORMAL HIGH (ref 65–99)
Glucose-Capillary: 231 mg/dL — ABNORMAL HIGH (ref 65–99)

## 2015-02-26 LAB — CBC
HCT: 35.3 % — ABNORMAL LOW (ref 36.0–46.0)
Hemoglobin: 11.6 g/dL — ABNORMAL LOW (ref 12.0–15.0)
MCH: 29.1 pg (ref 26.0–34.0)
MCHC: 32.9 g/dL (ref 30.0–36.0)
MCV: 88.5 fL (ref 78.0–100.0)
PLATELETS: 174 10*3/uL (ref 150–400)
RBC: 3.99 MIL/uL (ref 3.87–5.11)
RDW: 18 % — ABNORMAL HIGH (ref 11.5–15.5)
WBC: 7.1 10*3/uL (ref 4.0–10.5)

## 2015-02-26 LAB — PROTIME-INR
INR: 5.03 — AB (ref 0.00–1.49)
PROTHROMBIN TIME: 45.1 s — AB (ref 11.6–15.2)

## 2015-02-26 MED ORDER — ALPRAZOLAM 0.5 MG PO TABS
0.5000 mg | ORAL_TABLET | Freq: Three times a day (TID) | ORAL | Status: DC
  Administered 2015-02-26 – 2015-03-01 (×11): 0.5 mg via ORAL
  Filled 2015-02-26 (×11): qty 1

## 2015-02-26 MED ORDER — METOCLOPRAMIDE HCL 5 MG/ML IJ SOLN
5.0000 mg | Freq: Three times a day (TID) | INTRAMUSCULAR | Status: DC
  Administered 2015-02-27: 5 mg via INTRAVENOUS
  Filled 2015-02-26: qty 2

## 2015-02-26 MED ORDER — INSULIN GLARGINE 100 UNIT/ML ~~LOC~~ SOLN
50.0000 [IU] | Freq: Every day | SUBCUTANEOUS | Status: DC
  Administered 2015-02-26 – 2015-02-28 (×3): 50 [IU] via SUBCUTANEOUS
  Filled 2015-02-26 (×4): qty 0.5

## 2015-02-26 MED ORDER — OXYCODONE HCL 5 MG PO TABS
10.0000 mg | ORAL_TABLET | ORAL | Status: DC | PRN
  Administered 2015-02-27: 10 mg via ORAL
  Filled 2015-02-26: qty 2

## 2015-02-26 MED ORDER — TRAZODONE HCL 50 MG PO TABS: 25.0000 mg | ORAL_TABLET | Freq: Every evening | ORAL | Status: DC | PRN

## 2015-02-26 MED ORDER — OXYCODONE HCL 5 MG PO TABS: 10.0000 mg | ORAL_TABLET | ORAL | Status: DC | PRN

## 2015-02-26 NOTE — Progress Notes (Signed)
CRITICAL VALUE ALERT  Critical value received:  Pt 45.1 and  INR-5.03  Date of notification:  02/26/2015  Time of notification:  0451  Critical value read back:Yes.    Nurse who received alert:  Micael Hampshire RN  MD notified (1st page):  Jonette Eva  Time of first page:  0518  MD notified (2nd page):  Time of second page:  Responding MD:  M. Donnal Debar  Time MD responded:  [phone]

## 2015-02-26 NOTE — Progress Notes (Signed)
[facility] PROGRESS NOTE  Amber Bishop DPO:[id number] DOB: 1950-08-25 DOA: 02/24/2015 PCP: [name], MD  Brief Summary  The patient is a 65 y.o. year-old female with history of pancreatic cancer followed by Dr. Burr Medico, hypertension, anxiety, diabetes mellitus type 2 who presents with nausea and vomiting. She was diagnosed with pancreatic cancer after she had a CT scan which demonstrated of pancreatic mass in March 2016. She completed 4 cycles of mFOLFOX but had progression of disease so she was started on second line chemotherapy with gemcitabine and Abraxane in June 2016. In the same month, she had a 1-1/2 week hospitalization for right lower extremity DVT and she was started on Coumadin. In September, CT scan demonstrated stable disease.   Over the last 1-2 months, she has had an 8 pound weight loss with early satiety, nausea, abdominal distension.  Se has had severe 10 out of 10 back pain radiating from her lower cervical spine down to her upper lumbar area the prevention her from sleeping.  She has also had approximate 2 months of progressive epigastric abdominal pain.  She presented to the hospital because of nausea and vomiting.  In the ER, her labs were concerning for obstructive jaundice, INR of 8.  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  She appears to have progression of disease now with obstruction of biliary ducts.    Assessment/Plan  Progressive metastatic pancreatic cancer with worsening back and abdominal pain, failed two lines of chemotherapy.  -  Plan for home hospice at discharge -  Appreciate palliative care and oncology assistance  Nausea and vomiting with abdominal pain  are likely secondary to biliary obstruction with elevated transaminitis/bilirubin.  LFTs mildly lower but bilirubin higher - IR assistance appreciated - Addressing her elevated INR before any procedures - d/c Unasyn since she remained afebrile without leukocytosis and I believe that her nausea and vomiting are more likely due to ascites/peritoneal carcinomatosis -  Antiemetics and IV fluids  - Continue IV Dilaudid when necessary -  Increase oxycodone -  Trial of reglan   Elevated INR secondary to Coumadin therapy in the setting of progressive liver metastases - She is not safe for ongoing Coumadin therapy - Administer vitamin K 10 mg daily until INR wnl - Repeat INR in a.m.  Type 2 diabetes mellitus with hyperglycemia, hyperglycemic - Not eating or drinking reliably - Increase to Lantus 50 units daily at bedtime - Continue low-dose sliding scale insulin  -  Continue aspart to 15 units with meals  Essential hypertension, with elevated blood pressure  - Hold HCTZ and ARB secondary to dehydration  - Continue Norvasc   DVT, occurred in June of this year, needs ongoing anticoagulation and she is going be aggressive since this was triggered by underlying malignancy - Hold anticoagulation for now given elevated INR - Plan for Lovenox at discharge  Depression/anxiety, pressured speech - Scheduled Xanax per her home schedule, continue fluoxetine - When necessary Ativan added by palliative care   Diet:  Diabetic diet Access: Port IVF:  Yes Proph: currently supratherapeutic INR  Code Status: DO NOT RESUSCITATE Family Communication: Patient alone Disposition Plan:  Anticipate home with hospice pending INR wnl with alternative anticoagulation unless she would like full comfort measures and possibly a biliary drain.  Consultants:  Medical Oncology, Dr. Burr Medico  Palliative care, Dr. Hilma Favors  IR  Procedures:  CT chest, abdomen, pelvis  Antibiotics:  Unasyn  10/6 > 10/8  HPI/Subjective:  Still having early satiety with nausea but able to eat and no longer vomiting. Having back pain that did not improve with 5 mg of oxycodone. Was able to fall asleep and have resolution of pain with IV Dilaudid.    Objective: Filed Vitals:   02/26/15 0555 02/26/15 1043 02/26/15 1255 02/26/15 1415  BP: 157/99 141/87 143/89 116/67  Pulse: 91  83 104  Temp: 98.3 F (36.8 C)  98 F (36.7 C) 98.3 F (36.8 C)  TempSrc: Oral  Oral Oral  Resp: 17  18 18   Height:      Weight:      SpO2: 98%  96% 98%    Intake/Output Summary (Last 24 hours) at 02/26/15 1713 Last data filed at 02/26/15 1328  Gross per 24 hour  Intake 2752.1 ml  Output    650 ml  Net 2102.1 ml   Filed Weights   02/24/15 1218  Weight: 97.07 kg (214 lb)   Body mass index is 36.72 kg/(m^2).  Exam:   General:  Adult female, mild jaundice and scleral icterus, No acute distress  HEENT:  NCAT, MMM  Cardiovascular:  RRR, nl S1, S2 no mrg, 2+ pulses, warm extremities  Respiratory:  CTAB, no increased WOB  Abdomen:   NABS, soft, moderately distended, TTP without rebound or guarding  MSK:   Normal tone and bulk, no LEE  Neuro:  Grossly intact, ambulating in halls  Data Reviewed: Basic Metabolic Panel:  Recent Labs Lab 02/24/15 0423 02/25/15 0447 02/26/15 0405  NA 136 139 139  K 3.8 3.8 3.8  CL 99* 104 106  CO2 27 29 27   GLUCOSE 267* 238* 215*  BUN 14 11 11   CREATININE 0.40* 0.30* 0.42*  CALCIUM 8.4* 8.0* 7.6*   Liver Function Tests:  Recent Labs Lab 02/24/15 0423 02/25/15 0447 02/26/15 0405  AST 180* 153* 146*  ALT 190* 170* 150*  ALKPHOS 366* 337* 312*  BILITOT 7.1* 8.5* 9.6*  PROT 6.1* 5.4* 4.9*  ALBUMIN 3.3* 2.8* 2.5*    Recent Labs Lab 02/24/15 0423  LIPASE 46   No results for input(s): AMMONIA in the last 168 hours. CBC:  Recent Labs Lab 02/24/15 0423 02/25/15 0447 02/26/15 0405  WBC 8.5 7.4 7.1  NEUTROABS 6.3  --   --   HGB 12.5 12.0 11.6*   HCT 38.1 36.9 35.3*  MCV 88.6 88.7 88.5  PLT 187 195 174    Recent Results (from the past 240 hour(s))  Urine culture     Status: None   Collection Time: 02/24/15  4:59 AM  Result Value Ref Range Status   Specimen Description URINE, CLEAN CATCH  Final   Special Requests Normal  Final   Culture   Final    MULTIPLE SPECIES PRESENT, SUGGEST RECOLLECTION Performed at [facility]    Report Status 02/25/2015 FINAL  Final     Studies: No results found.  Scheduled Meds: . ALPRAZolam  0.5 mg Oral TID AC  . amLODipine  5 mg Oral q morning - 10a  . FLUoxetine  20 mg Oral q morning - 10a  . insulin aspart  0-5 Units Subcutaneous QHS  . insulin aspart  0-9 Units Subcutaneous TID WC  . insulin aspart  15 Units Subcutaneous TID WC  . insulin glargine  50 Units Subcutaneous QHS  .  multivitamin with minerals   Oral q morning - 10a  . ondansetron (ZOFRAN) IV  4 mg Intravenous 3 times per day   Continuous Infusions:    Active Problems:   Diabetes mellitus type 2, uncontrolled, without complications (HCC)   Pancreatic cancer metastasized to liver [facility])   Primary pancreatic cancer with metastasis to other site [facility])   DVT (deep venous thrombosis) (HCC)   Nausea and vomiting   Biliary obstruction   Malignant neoplasm of pancreas ([place])    Time spent: 30 min    Lindsee Labarre, [facility] Pager [phone]. If 7PM-7AM, please contact night-coverage at [email or web address], password [facility] 02/26/2015, 5:13 PM  LOS: 2 days

## 2015-02-27 LAB — COMPREHENSIVE METABOLIC PANEL
ALBUMIN: 2.8 g/dL — AB (ref 3.5–5.0)
ALT: 171 U/L — ABNORMAL HIGH (ref 14–54)
AST: 183 U/L — AB (ref 15–41)
Alkaline Phosphatase: 364 U/L — ABNORMAL HIGH (ref 38–126)
Anion gap: 8 (ref 5–15)
BUN: 11 mg/dL (ref 6–20)
CHLORIDE: 104 mmol/L (ref 101–111)
CO2: 28 mmol/L (ref 22–32)
Calcium: 8 mg/dL — ABNORMAL LOW (ref 8.9–10.3)
Creatinine, Ser: <0.3 mg/dL — ABNORMAL LOW (ref 0.44–1.00)
Glucose, Bld: 134 mg/dL — ABNORMAL HIGH (ref 65–99)
POTASSIUM: 3.6 mmol/L (ref 3.5–5.1)
SODIUM: 140 mmol/L (ref 135–145)
Total Bilirubin: 13.1 mg/dL — ABNORMAL HIGH (ref 0.3–1.2)
Total Protein: 5.8 g/dL — ABNORMAL LOW (ref 6.5–8.1)

## 2015-02-27 LAB — CBC
HEMATOCRIT: 37.4 % (ref 36.0–46.0)
Hemoglobin: 12.5 g/dL (ref 12.0–15.0)
MCH: 29.3 pg (ref 26.0–34.0)
MCHC: 33.4 g/dL (ref 30.0–36.0)
MCV: 87.6 fL (ref 78.0–100.0)
Platelets: 229 10*3/uL (ref 150–400)
RBC: 4.27 MIL/uL (ref 3.87–5.11)
RDW: 18.3 % — AB (ref 11.5–15.5)
WBC: 8.9 10*3/uL (ref 4.0–10.5)

## 2015-02-27 LAB — GLUCOSE, CAPILLARY
GLUCOSE-CAPILLARY: 153 mg/dL — AB (ref 65–99)
GLUCOSE-CAPILLARY: 207 mg/dL — AB (ref 65–99)
Glucose-Capillary: 93 mg/dL (ref 65–99)
Glucose-Capillary: 147 mg/dL — ABNORMAL HIGH (ref 65–99)

## 2015-02-27 LAB — PROTIME-INR
INR: 3.23 — AB (ref 0.00–1.49)
PROTHROMBIN TIME: 32.3 s — AB (ref 11.6–15.2)

## 2015-02-27 MED ORDER — FUROSEMIDE 20 MG PO TABS: 20.0000 mg | ORAL_TABLET | Freq: Every day | ORAL | Status: DC

## 2015-02-27 MED ORDER — MORPHINE SULFATE (CONCENTRATE) 10 MG/0.5ML PO SOLN
20.0000 mg | ORAL | Status: DC | PRN
  Administered 2015-02-28 – 2015-03-01 (×2): 20 mg via ORAL
  Filled 2015-02-27 (×2): qty 1

## 2015-02-27 MED ORDER — ONDANSETRON HCL 4 MG/2ML IJ SOLN
4.0000 mg | Freq: Three times a day (TID) | INTRAMUSCULAR | Status: DC
  Administered 2015-02-27 – 2015-03-01 (×6): 4 mg via INTRAVENOUS
  Filled 2015-02-27 (×6): qty 2

## 2015-02-27 MED ORDER — FUROSEMIDE 20 MG PO TABS
20.0000 mg | ORAL_TABLET | Freq: Every day | ORAL | Status: DC
  Administered 2015-02-27 – 2015-03-01 (×3): 20 mg via ORAL
  Filled 2015-02-27 (×3): qty 1

## 2015-02-27 NOTE — Care Management Note (Addendum)
Case Management Note  Patient Details  Name: Amber Bishop MRN: [id number] Date of Birth: 02/17/1951  Subjective/Objective:                  pancreatic cancer   Action/Plan: Discharge planning   Expected Discharge Date:   (UNKNOWN)               Expected Discharge Plan:  Home w Hospice Care  In-House Referral:     Discharge planning Services  CM Consult  Post Acute Care Choice:  Hospice Choice offered to:  Patient  DME Arranged:  N/A DME Agency:  NA  HH Arranged:  RN [place] Agency:  [facility]  Status of Service:  In process, will continue to follow  Medicare Important Message Given:    Date Medicare IM Given:    Medicare IM give by:    Date Additional Medicare IM Given:    Additional Medicare Important Message give by:     If discussed at [place] of Stay Meetings, dates discussed:    Additional Comments: CM spoke with patient at the bedside. Patient states she plans to go home with hospice. Per MD note the plan is for the patient to be discharged home with hospice. She is unable to afford 24 hour assistance out-of-pocket. She will speak with her son and daughter-in-law today who live in [place]. She plans to return to her apartment. She states she has and can ask her boyfriend, Amber Bishop, to stay with her in her apartment. [facility]. Will fax referral to [facility]. Sharyn Lull with [facility] notified of the referral.   Apolonio Schneiders, RN 02/27/2015, 9:55 AM

## 2015-02-27 NOTE — Progress Notes (Signed)
[facility] PROGRESS NOTE  Amber Bishop WFU:[id number] DOB: [birth year] DOA: 02/24/2015 PCP: [name], MD  Brief Summary  The patient is a 64 y.o. year-old female with history of pancreatic cancer followed by Dr. Burr Medico, hypertension, anxiety, diabetes mellitus type 2 who presents with nausea and vomiting. She was diagnosed with pancreatic cancer after she had a CT scan which demonstrated of pancreatic mass in March 2016. She completed 4 cycles of mFOLFOX but had progression of disease so she was started on second line chemotherapy with gemcitabine and Abraxane in June 2016. In the same month, she had a 1-1/2 week hospitalization for right lower extremity DVT and she was started on Coumadin. In September, CT scan demonstrated stable disease.   Over the last 1-2 months, she has had an 8 pound weight loss with early satiety, nausea, abdominal distension.  Se has had severe 10 out of 10 back pain radiating from her lower cervical spine down to her upper lumbar area the prevention her from sleeping.  She has also had approximate 2 months of progressive epigastric abdominal pain.  She presented to the hospital because of nausea and vomiting.  In the ER, her labs were concerning for obstructive jaundice, INR of 8.  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  She appears to have progression of disease now with obstruction of biliary ducts.    Assessment/Plan  Progressive metastatic pancreatic cancer with worsening back and abdominal pain, failed two lines of chemotherapy. Pain still uncontrolled.  Perhaps she is not absorbing medications normally due to her ascites/peritoneal carcinomatosis.  I am worried  that she is looking more ill today.  She is not safe to go home alone and family and friends are working on coming up with a plan.  She declines SNF.   -  Plan for home hospice at discharge -  Appreciate palliative care and oncology assistance -  D/c oxycodone  -  Start sublingual morphine  Nausea and vomiting with abdominal pain are likely secondary to biliary obstruction with elevated transaminitis/bilirubin which are rising.  Reglan did not work.  Feeling more sick to stomach today and more abdominal distension/swelling. - IR assistance appreciated - Addressing her elevated INR before any procedures -  Patient leaning away from having any procedures done and I think she would be at risk for having ascites leak from around the tube -  D/c IV fluids  -  D/c reglan and resume zofran  Elevated INR secondary to Coumadin therapy in the setting of progressive liver metastases, INR trending down - She is not safe for ongoing Coumadin therapy - Administer vitamin K 10 mg daily until INR wnl - Repeat INR in a.m.  Type 2 diabetes mellitus with hyperglycemia, CBG better controlled.  - Not eating or drinking reliably - Increase to Lantus 50 units daily at bedtime - Continue low-dose sliding scale insulin  -  Continue aspart to 15 units with meals  Essential hypertension, with elevated blood pressure  -  D/c norvasc to focus on minimizing pill burden -  Start lasix tomorrow  DVT, at this point she would like to continue anticoagulation - Hold anticoagulation for now given elevated INR - Plan for Lovenox at discharge  Depression/anxiety, pressured speech - Scheduled Xanax per her  home schedule, continue fluoxetine - When necessary Ativan added by palliative care   Diet:  Diabetic diet Access: Port IVF:  off Proph: currently supratherapeutic INR  Code Status: DO NOT RESUSCITATE Family Communication: Patient alone Disposition Plan:  Anticipate home with hospice possibly  tomorrow if INR closer to 2 and if family and friends can arrange for home supervision.  Anticipate fast decline.    Consultants:  Medical Oncology, Dr. Burr Medico  Palliative care, Dr. Hilma Favors  IR  Procedures:  CT chest, abdomen, pelvis  Antibiotics:  Unasyn 10/6 > 10/8  HPI/Subjective:  Still having early satiety with nausea and now vomiting again.  Pain is not touched by oxycodone.  Was able to fall asleep and have resolution of pain with IV Dilaudid.    Objective: Filed Vitals:   02/26/15 1415 02/26/15 2025 02/27/15 0550 02/27/15 1029  BP: 116/67 143/87 146/96 145/84  Pulse: 104 88 93   Temp: 98.3 F (36.8 C) 98.3 F (36.8 C) 97.9 F (36.6 C)   TempSrc: Oral Oral Oral   Resp: 18 18 18    Height:      Weight:      SpO2: 98% 98% 98%     Intake/Output Summary (Last 24 hours) at 02/27/15 1218 Last data filed at 02/27/15 0900  Gross per 24 hour  Intake    840 ml  Output      0 ml  Net    840 ml   Filed Weights   02/24/15 1218  Weight: 97.07 kg (214 lb)   Body mass index is 36.72 kg/(m^2).  Exam:   General:  Adult female, jaundice and scleral icterus, ill appearing today  HEENT:  NCAT, MMM  Cardiovascular:  RRR, nl S1, S2 no mrg, 2+ pulses, warm extremities  Respiratory:  CTAB, no increased WOB  Abdomen:   NABS, soft, moderately distended, TTP without rebound or guarding  MSK:   Normal tone and bulk, 1+ pitting bilateral LEE  Neuro:  Grossly intact, ambulating in halls  Data Reviewed: Basic Metabolic Panel:  Recent Labs Lab 02/24/15 0423 02/25/15 0447 02/26/15 0405 02/27/15 0529  NA 136 139 139 140  K 3.8 3.8 3.8 3.6  CL 99* 104 106 104  CO2 27 29 27 28   GLUCOSE 267* 238* 215* 134*  BUN 14 11 11 11   CREATININE 0.40* 0.30* 0.42* <0.30*  CALCIUM 8.4* 8.0* 7.6* 8.0*   Liver Function Tests:  Recent Labs Lab 02/24/15 0423 02/25/15 0447 02/26/15 0405 02/27/15 0529  AST 180* 153* 146* 183*  ALT 190* 170* 150* 171*  ALKPHOS 366* 337* 312*  364*  BILITOT 7.1* 8.5* 9.6* 13.1*  PROT 6.1* 5.4* 4.9* 5.8*  ALBUMIN 3.3* 2.8* 2.5* 2.8*    Recent Labs Lab 02/24/15 0423  LIPASE 46   No results for input(s): AMMONIA in the last 168 hours. CBC:  Recent Labs Lab 02/24/15 0423 02/25/15 0447 02/26/15 0405 02/27/15 0529  WBC 8.5 7.4 7.1 8.9  NEUTROABS 6.3  --   --   --   HGB 12.5 12.0 11.6* 12.5  HCT 38.1 36.9 35.3* 37.4  MCV 88.6 88.7 88.5 87.6  PLT 187 195 174 229    Recent Results (from the past 240 hour(s))  Urine culture     Status: None   Collection Time: 02/24/15  4:59 AM  Result Value Ref Range Status   Specimen Description URINE, CLEAN CATCH  Final   Special Requests Normal  Final   Culture   Final    MULTIPLE  SPECIES PRESENT, SUGGEST RECOLLECTION Performed at [facility]    Report Status 02/25/2015 FINAL  Final     Studies: No results found.  Scheduled Meds: . ALPRAZolam  0.5 mg Oral TID AC  . FLUoxetine  20 mg Oral q morning - 10a  . [START ON 02/28/2015] furosemide  20 mg Oral Daily  . insulin aspart  0-5 Units Subcutaneous QHS  . insulin aspart  0-9 Units Subcutaneous TID WC  . insulin aspart  15 Units Subcutaneous TID WC  . insulin glargine  50 Units Subcutaneous QHS  . multivitamin with minerals   Oral q morning - 10a  . ondansetron (ZOFRAN) IV  4 mg Intravenous TID AC   Continuous Infusions:    Active Problems:   Diabetes mellitus type 2, uncontrolled, without complications (HCC)   Pancreatic cancer metastasized to liver [facility])   Primary pancreatic cancer with metastasis to other site [facility])   DVT (deep venous thrombosis) (HCC)   Nausea and vomiting   Biliary obstruction   Malignant neoplasm of pancreas ([place])    Time spent: 30 min    Yaretzy Olazabal, [facility] Pager [phone]. If 7PM-7AM, please contact night-coverage at [email or web address], password [facility] 02/27/2015, 12:18 PM  LOS: 3 days

## 2015-02-28 LAB — COMPREHENSIVE METABOLIC PANEL
ALT: 169 U/L — ABNORMAL HIGH (ref 14–54)
AST: 189 U/L — AB (ref 15–41)
Albumin: 2.7 g/dL — ABNORMAL LOW (ref 3.5–5.0)
Alkaline Phosphatase: 397 U/L — ABNORMAL HIGH (ref 38–126)
Anion gap: 8 (ref 5–15)
BILIRUBIN TOTAL: 15.8 mg/dL — AB (ref 0.3–1.2)
BUN: 14 mg/dL (ref 6–20)
CO2: 26 mmol/L (ref 22–32)
Calcium: 8 mg/dL — ABNORMAL LOW (ref 8.9–10.3)
Chloride: 104 mmol/L (ref 101–111)
Creatinine, Ser: <0.3 mg/dL — ABNORMAL LOW (ref 0.44–1.00)
Glucose, Bld: 184 mg/dL — ABNORMAL HIGH (ref 65–99)
POTASSIUM: 3.6 mmol/L (ref 3.5–5.1)
Sodium: 138 mmol/L (ref 135–145)
TOTAL PROTEIN: 5.7 g/dL — AB (ref 6.5–8.1)

## 2015-02-28 LAB — CBC
HEMATOCRIT: 39.7 % (ref 36.0–46.0)
Hemoglobin: 12.8 g/dL (ref 12.0–15.0)
MCH: 28.4 pg (ref 26.0–34.0)
MCHC: 32.2 g/dL (ref 30.0–36.0)
MCV: 88.2 fL (ref 78.0–100.0)
PLATELETS: 264 10*3/uL (ref 150–400)
RBC: 4.5 MIL/uL (ref 3.87–5.11)
RDW: 18.9 % — AB (ref 11.5–15.5)
WBC: 10.9 10*3/uL — AB (ref 4.0–10.5)

## 2015-02-28 LAB — GLUCOSE, CAPILLARY
GLUCOSE-CAPILLARY: 169 mg/dL — AB (ref 65–99)
Glucose-Capillary: 189 mg/dL — ABNORMAL HIGH (ref 65–99)
Glucose-Capillary: 120 mg/dL — ABNORMAL HIGH (ref 65–99)
Glucose-Capillary: 192 mg/dL — ABNORMAL HIGH (ref 65–99)

## 2015-02-28 LAB — AMMONIA: Ammonia: 38 umol/L — ABNORMAL HIGH (ref 9–35)

## 2015-02-28 LAB — PROTIME-INR
INR: 2.49 — ABNORMAL HIGH (ref 0.00–1.49)
PROTHROMBIN TIME: 26.6 s — AB (ref 11.6–15.2)

## 2015-02-28 MED ORDER — DIPHENHYDRAMINE HCL 12.5 MG/5ML PO ELIX
12.5000 mg | ORAL_SOLUTION | Freq: Three times a day (TID) | ORAL | Status: DC | PRN
  Administered 2015-02-28: 12.5 mg via ORAL
  Filled 2015-02-28: qty 5

## 2015-02-28 MED ORDER — FAMOTIDINE 20 MG PO TABS
20.0000 mg | ORAL_TABLET | Freq: Two times a day (BID) | ORAL | Status: DC | PRN
  Administered 2015-02-28: 20 mg via ORAL
  Filled 2015-02-28: qty 1

## 2015-02-28 MED ORDER — PHYTONADIONE 5 MG PO TABS
10.0000 mg | ORAL_TABLET | Freq: Every day | ORAL | Status: DC
  Administered 2015-02-28 – 2015-03-01 (×2): 10 mg via ORAL
  Filled 2015-02-28 (×3): qty 2

## 2015-02-28 NOTE — Care Management Important Message (Signed)
Important Message  Patient Details  Name: YATZIRI WAINWRIGHT MRN: [id number] Date of Birth: 15-May-1951   Medicare Important Message Given:  Yes-second notification given    Camillo Flaming 02/28/2015, 1:22 PMImportant Message  Patient Details  Name: TRISTIN GLADMAN MRN: [id number] Date of Birth: Jun 16, 1950   Medicare Important Message Given:  Yes-second notification given    Camillo Flaming 02/28/2015, 1:22 PM

## 2015-02-28 NOTE — Progress Notes (Addendum)
Notified by Donella Stade CMRN of family request for Hospice and B and E services at home after discharge. Chart and patient Information currently under review to confirm hospice eligibility.   Spoke with patient, at bedside to initiate education related to hospice philosophy, services and team approach to care. Patient verbalized understanding of the information provided. Per discussion plan is for discharge to home by personal vehicle with boyfriend, Ray possibly tomorrow.   Spoke with Zannie Cove, Daughter-in-law on the phone per patient request.  Family updated on hospice services.  Family would like to try home with hospice services and are currently attempting to set up [employer] volunteers to assist with tasks such as laundry.  Please send signed completed DNR form home with patient.  Patient will need prescriptions for discharge comfort medications.   DME needs discussed and family and patient denies any DME needs.  [facility] aware of the above.   Completed discharge summary will need to be faxed to [facility] at [phone] when final.  Please notify [facility] when patient is ready to leave unit at discharge-call [phone].  [facility] information and contact numbers have been given to patient during visit.    Please call with any questions.  Freddi Starr RN, [facility] Liaison  [phone]

## 2015-02-28 NOTE — Progress Notes (Signed)
[facility] PROGRESS NOTE  Amber Bishop GMW:[id number] DOB: 1951/03/17 DOA: 02/24/2015 PCP: Amber Schwalbe, MD  Brief Summary  The patient is a 64 y.o. year-old female with history of pancreatic cancer followed by Dr. Burr Medico, hypertension, anxiety, diabetes mellitus type 2 who presents with nausea and vomiting. She was diagnosed with pancreatic cancer after she had a CT scan which demonstrated of pancreatic mass in March 2016. She completed 4 cycles of mFOLFOX but had progression of disease so she was started on second line chemotherapy with gemcitabine and Abraxane in June 2016. In the same month, she had a 1-1/2 week hospitalization for right lower extremity DVT and she was started on Coumadin. In September, CT scan demonstrated stable disease.   Over the last 1-2 months, she has had an 8 pound weight loss with early satiety, nausea, abdominal distension.  Se has had severe 10 out of 10 back pain radiating from her lower cervical spine down to her upper lumbar area the prevention her from sleeping.  She has also had approximate 2 months of progressive epigastric abdominal pain.  She presented to the hospital because of nausea and vomiting.  In the ER, her labs were concerning for obstructive jaundice, INR of 8.  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  She appears to have progression of disease now with obstruction of biliary ducts.    Assessment/Plan  Progressive metastatic pancreatic cancer with worsening back and abdominal pain, failed two lines of chemotherapy. Pain still better controlled with morphine.  More jaundiced and confused today.   -  Plan for home hospice at discharge with family and  friends to assist -  Appreciate palliative care and oncology assistance -  Continue sublingual morphine  Nausea and vomiting with abdominal pain are likely secondary to biliary obstruction with elevated transaminitis/bilirubin which are rising as is her WBC count.  This could be worrisome for developing cholangitis.  Nausea better with zofran. - IR assistance appreciated - INR trending down -  Repeat CBC and bilirubin in AM and monitor for fevers.  If stable/improved, then plan to d/c.    Elevated INR secondary to Coumadin therapy in the setting of progressive liver metastases, INR trending down - She is not safe for ongoing Coumadin therapy - Administer vitamin K 10 mg  - Repeat INR in a.m.  Type 2 diabetes mellitus with hyperglycemia, CBG better controlled.  - Not eating or drinking reliably - Continue lantus 50 units daily at bedtime - Continue low-dose sliding scale insulin  -  Continue aspart to 15 units with meals  Essential hypertension, with elevated blood pressure  -  D/c norvasc to focus on minimizing pill burden -  Continue lasix tomorrow  DVT, at this point she would like to continue anticoagulation - Hold anticoagulation for now given elevated INR - Plan for Lovenox at discharge  Depression/anxiety, pressured speech - Scheduled Xanax per her home schedule, continue fluoxetine - When necessary Ativan added by palliative care   Diet:  Diabetic diet Access: Port IVF:  off Proph: currently supratherapeutic INR  Code Status: DO NOT RESUSCITATE Family Communication: Patient alone Disposition Plan:  Anticipate home with hospice tomorrow if bilirubin and WBC stable.    Consultants:  Medical Oncology, Dr. Burr Medico  Palliative care, Dr.  Hilma Favors  IR  Procedures:  CT chest, abdomen, pelvis  Antibiotics:  Unasyn 10/6 > 10/8  HPI/Subjective:  Feels sleepier and more confused today.  Pain better controlled with morphine.  Denies SOB.  Nausea better  with zofran.    Objective: Filed Vitals:   02/27/15 1029 02/27/15 1305 02/27/15 2110 02/28/15 0500  BP: 145/84 139/82 147/98 114/67  Pulse:  89 105 97  Temp:  98.2 F (36.8 C) 98.3 F (36.8 C) 98.1 F (36.7 C)  TempSrc:  Oral Oral Oral  Resp:  20 18 18   Height:      Weight:      SpO2:  99% 99% 96%    Intake/Output Summary (Last 24 hours) at 02/28/15 1716 Last data filed at 02/27/15 1818  Gross per 24 hour  Intake    480 ml  Output      0 ml  Net    480 ml   Filed Weights   02/24/15 1218  Weight: 97.07 kg (214 lb)   Body mass index is 36.72 kg/(m^2).  Exam:   General:  Adult female, jaundice and scleral icterus, ill appearing today  HEENT:  NCAT, MMM  Cardiovascular:  RRR, nl S1, S2 no mrg, 2+ pulses, warm extremities  Respiratory:  CTAB, no increased WOB  Abdomen:   NABS, soft, moderately distended, TTP without rebound or guarding  MSK:   Normal tone and bulk, 1+ pitting bilateral LEE  Neuro:  Grossly intact, ambulating in halls  Data Reviewed: Basic Metabolic Panel:  Recent Labs Lab 02/24/15 0423 02/25/15 0447 02/26/15 0405 02/27/15 0529 02/28/15 0609  NA 136 139 139 140 138  K 3.8 3.8 3.8 3.6 3.6  CL 99* 104 106 104 104  CO2 27 29 27 28 26   GLUCOSE 267* 238* 215* 134* 184*  BUN 14 11 11 11 14   CREATININE 0.40* 0.30* 0.42* <0.30* <0.30*  CALCIUM 8.4* 8.0* 7.6* 8.0* 8.0*   Liver Function Tests:  Recent Labs Lab 02/24/15 0423 02/25/15 0447 02/26/15 0405 02/27/15 0529 02/28/15 0609  AST 180* 153* 146* 183* 189*  ALT 190* 170* 150* 171* 169*  ALKPHOS 366* 337* 312* 364* 397*  BILITOT 7.1* 8.5* 9.6* 13.1* 15.8*  PROT 6.1* 5.4* 4.9* 5.8* 5.7*  ALBUMIN 3.3* 2.8* 2.5* 2.8* 2.7*    Recent Labs Lab 02/24/15 0423  LIPASE 46    Recent Labs Lab 02/28/15 1040  AMMONIA 38*   CBC:  Recent Labs Lab 02/24/15 0423 02/25/15 0447 02/26/15 0405 02/27/15 0529 02/28/15 0609  WBC 8.5 7.4 7.1 8.9 10.9*  NEUTROABS 6.3  --   --   --   --    HGB 12.5 12.0 11.6* 12.5 12.8  HCT 38.1 36.9 35.3* 37.4 39.7  MCV 88.6 88.7 88.5 87.6 88.2  PLT 187 195 174 229 264    Recent Results (from the past 240 hour(s))  Urine culture     Status: None   Collection Time: 02/24/15  4:59 AM  Result Value Ref Range Status   Specimen Description URINE, CLEAN CATCH  Final   Special Requests Normal  Final   Culture   Final    MULTIPLE SPECIES PRESENT, SUGGEST RECOLLECTION Performed at [facility]    Report Status 02/25/2015 FINAL  Final     Studies: No results found.  Scheduled Meds: . ALPRAZolam  0.5 mg Oral TID AC  . FLUoxetine  20 mg Oral q morning - 10a  . furosemide  20 mg Oral Daily  . insulin aspart  0-5 Units Subcutaneous QHS  . insulin aspart  0-9 Units Subcutaneous TID WC  . insulin aspart  15 Units Subcutaneous TID WC  . insulin glargine  50 Units Subcutaneous QHS  . multivitamin with minerals   Oral q morning - 10a  . ondansetron (ZOFRAN) IV  4 mg Intravenous TID AC   Continuous Infusions:    Active Problems:   Diabetes mellitus type 2, uncontrolled, without complications (HCC)   Pancreatic cancer metastasized to liver [facility])   Primary pancreatic cancer with metastasis to other site [facility])   DVT (deep venous thrombosis) (HCC)   Nausea and vomiting   Biliary obstruction   Malignant neoplasm of pancreas ([place])    Time spent: 30 min    Choya Tornow, [facility] Pager [phone]. If 7PM-7AM, please contact night-coverage at [email or web address], password [facility] 02/28/2015, 5:16 PM  LOS: 4 days

## 2015-03-01 DIAGNOSIS — G893 Neoplasm related pain (acute) (chronic): Secondary | ICD-10-CM

## 2015-03-01 LAB — COMPREHENSIVE METABOLIC PANEL
ALBUMIN: 2.4 g/dL — AB (ref 3.5–5.0)
ALK PHOS: 374 U/L — AB (ref 38–126)
ALT: 150 U/L — ABNORMAL HIGH (ref 14–54)
AST: 173 U/L — AB (ref 15–41)
Anion gap: 6 (ref 5–15)
BILIRUBIN TOTAL: 14.8 mg/dL — AB (ref 0.3–1.2)
BUN: 19 mg/dL (ref 6–20)
CALCIUM: 8 mg/dL — AB (ref 8.9–10.3)
CO2: 28 mmol/L (ref 22–32)
Chloride: 104 mmol/L (ref 101–111)
Creatinine, Ser: 0.3 mg/dL — ABNORMAL LOW (ref 0.44–1.00)
GFR calc Af Amer: >60 mL/min (ref >60)
GFR calc non Af Amer: >60 mL/min (ref >60)
GLUCOSE: 200 mg/dL — AB (ref 65–99)
Potassium: 3.6 mmol/L (ref 3.5–5.1)
Sodium: 138 mmol/L (ref 135–145)
TOTAL PROTEIN: 5.1 g/dL — AB (ref 6.5–8.1)

## 2015-03-01 LAB — CBC
HEMATOCRIT: 35.1 % — AB (ref 36.0–46.0)
Hemoglobin: 11.5 g/dL — ABNORMAL LOW (ref 12.0–15.0)
MCH: 28.8 pg (ref 26.0–34.0)
MCHC: 32.8 g/dL (ref 30.0–36.0)
MCV: 88 fL (ref 78.0–100.0)
Platelets: 192 10*3/uL (ref 150–400)
RBC: 3.99 MIL/uL (ref 3.87–5.11)
RDW: 19.4 % — AB (ref 11.5–15.5)
WBC: 8.1 10*3/uL (ref 4.0–10.5)

## 2015-03-01 LAB — GLUCOSE, CAPILLARY: GLUCOSE-CAPILLARY: 182 mg/dL — AB (ref 65–99)

## 2015-03-01 LAB — PROTIME-INR
INR: 2.4 — ABNORMAL HIGH (ref 0.00–1.49)
Prothrombin Time: 25.8 seconds — ABNORMAL HIGH (ref 11.6–15.2)

## 2015-03-01 MED ORDER — INSULIN ASPART 100 UNIT/ML FLEXPEN: 10.0000 [IU] | PEN_INJECTOR | Freq: Three times a day (TID) | SUBCUTANEOUS | Status: AC | PRN

## 2015-03-01 MED ORDER — MORPHINE SULFATE (CONCENTRATE) 10 MG/0.5ML PO SOLN: 20.0000 mg | ORAL | Status: DC | PRN

## 2015-03-01 MED ORDER — INSULIN GLARGINE 100 UNIT/ML ~~LOC~~ SOLN: 60.0000 [IU] | Freq: Every day | SUBCUTANEOUS | Status: AC

## 2015-03-01 MED ORDER — FAMOTIDINE 20 MG PO TABS: 20.0000 mg | ORAL_TABLET | Freq: Two times a day (BID) | ORAL | Status: AC | PRN

## 2015-03-01 MED ORDER — PROCHLORPERAZINE MALEATE 10 MG PO TABS: 10.0000 mg | ORAL_TABLET | Freq: Four times a day (QID) | ORAL | Status: AC | PRN

## 2015-03-01 MED ORDER — HEPARIN SOD (PORK) LOCK FLUSH 100 UNIT/ML IV SOLN
500.0000 [IU] | Freq: Once | INTRAVENOUS | Status: AC
  Administered 2015-03-01: 500 [IU] via INTRAVENOUS
  Filled 2015-03-01: qty 5

## 2015-03-01 MED ORDER — MORPHINE SULFATE (CONCENTRATE) 10 MG/0.5ML PO SOLN: 20.0000 mg | ORAL | Status: AC | PRN

## 2015-03-01 MED ORDER — ALPRAZOLAM 0.5 MG PO TABS: ORAL_TABLET | ORAL | Status: AC

## 2015-03-01 MED ORDER — TRAZODONE HCL 50 MG PO TABS: 25.0000 mg | ORAL_TABLET | Freq: Every evening | ORAL | Status: AC | PRN

## 2015-03-01 MED ORDER — ONDANSETRON 4 MG PO TBDP: 4.0000 mg | ORAL_TABLET | Freq: Four times a day (QID) | ORAL | Status: AC | PRN

## 2015-03-01 MED ORDER — FUROSEMIDE 20 MG PO TABS: 20.0000 mg | ORAL_TABLET | Freq: Every day | ORAL | Status: AC | PRN

## 2015-03-01 NOTE — Progress Notes (Signed)
ANTICOAGULATION CONSULT NOTE - Follow up  Pharmacy Consult for Lovenox when INR < 2 Indication: DVT, chronic Warfarin PTA  Allergies  Allergen Reactions  . Naprosyn [Naproxen] Nausea And Vomiting    Very nauseated but no vomiting.  . Metformin And Related Diarrhea    Pt has IBS    Patient Measurements: Height: 5\' 4"  (162.6 cm) Weight: 214 lb (97.07 kg) IBW/kg (Calculated) : 54.7  Vital Signs: Temp: 97.7 F (36.5 C) (10/11 0541) Temp Source: Oral (10/11 0541) BP: 120/61 mmHg (10/11 0541) Pulse Rate: 84 (10/11 0541)  Labs:  Recent Labs  02/27/15 0529 02/28/15 0609 03/01/15 0530  HGB 12.5 12.8 11.5*  HCT 37.4 39.7 35.1*  PLT 229 264 192  LABPROT 32.3* 26.6* 25.8*  INR 3.23* 2.49* 2.40*  CREATININE <0.30* <0.30* 0.30*   Estimated Creatinine Clearance: 78.3 mL/min (by C-G formula based on Cr of 0.3).  Medical History: Past Medical History  Diagnosis Date  . Anxiety   . Hypertension   . IBS (irritable bowel syndrome)   . Hyperplastic colon polyp   . Allergy   . Anemia   . Arthritis   . Diverticulosis   . Dilated aortic root ([place])     seen on prior echo but echo 05/2013 showed normal dimensions  . Bicuspid aortic valve   . LVE (left ventricular enlargement)     mild by echo 1/15 with EF 50-55%  . pancreatic ca w/ liver mets dx'd 07/2014  . Diabetes mellitus without complication [facility])    Assessment: Lagi.Manning F w/ metastatic pancreatic cancer presented to the ED w/ abdominal pain and N/V. PTA Warfarin for a RLE DVT. History of chronic thrombocytopenia but platelets are in the normal range on admission. Warfarin held on admit for elevated INR (8.0), and Vitamin K given 10/6, 10/7 & 10/10.  Biliary obstruction exacerbating INR elevation.  Planning Lovenox in place of Warfarin at discharge, to begin Warfarin when INR <2  03/01/2015  INR = 2.40  H/H continues low, stable. Plt wnl  Goal of Therapy:  Monitor platelets by anticoagulation protocol: Yes   Plan:   No  Lovenox needed today, INR remains > 2  Lovenox dose for discharge; 150mg  SQ q24 (1.5mg Ellison Carwin)  Minda Ditto PharmD Pager [phone] 03/01/2015, 8:05 AM

## 2015-03-01 NOTE — Progress Notes (Signed)
Amber Bishop   DOB:01-Mar-1951   CW#:[id number]   WTU#:[id number]  Subjective: Amber Bishop is going home with hospice today. I had nice conversation with her and her boyfriend Ray before she left.   Objective:  Filed Vitals:   03/01/15 0541  BP: 120/61  Pulse: 84  Temp: 97.7 F (36.5 C)  Resp: 16    Body mass index is 36.72 kg/(m^2).  Intake/Output Summary (Last 24 hours) at 03/01/15 2134 Last data filed at 03/01/15 0542  Gross per 24 hour  Intake    450 ml  Output      0 ml  Net    450 ml     Sclerae and skin (+) jaundice   Oropharynx clear  No peripheral adenopathy  Lungs clear -- no rales or rhonchi  Heart regular rate and rhythm  Abdomen benign  MSK no focal spinal tenderness, no peripheral edema  Neuro nonfocal    CBG (last 3)   Recent Labs  02/28/15 1738 02/28/15 2059 03/01/15 0734  GLUCAP 120* 192* 182*     Labs:  Lab Results  Component Value Date   WBC 8.1 03/01/2015   HGB 11.5* 03/01/2015   HCT 35.1* 03/01/2015   MCV 88.0 03/01/2015   PLT 192 03/01/2015   NEUTROABS 6.3 02/24/2015    @LASTCHEMISTRY @  Urine Studies No results for input(s): UHGB, CRYS in the last 72 hours.  Invalid input(s): UACOL, UAPR, USPG, UPH, UTP, UGL, UKET, UBIL, UNIT, UROB, [place], UEPI, UWBC, [place], [place]  Basic Metabolic Panel:  Recent Labs Lab 02/25/15 0447 02/26/15 0405 02/27/15 0529 02/28/15 0609 03/01/15 0530  NA 139 139 140 138 138  K 3.8 3.8 3.6 3.6 3.6  CL 104 106 104 104 104  CO2 29 27 28 26 28   GLUCOSE 238* 215* 134* 184* 200*  BUN 11 11 11 14 19   CREATININE 0.30* 0.42* <0.30* <0.30* 0.30*  CALCIUM 8.0* 7.6* 8.0* 8.0* 8.0*   GFR Estimated Creatinine Clearance: 78.3 mL/min (by C-G formula based on Cr of 0.3). Liver Function Tests:  Recent Labs Lab 02/25/15 0447 02/26/15 0405 02/27/15 0529 02/28/15 0609 03/01/15 0530  AST 153* 146* 183* 189* 173*  ALT 170* 150* 171* 169* 150*  ALKPHOS 337* 312* 364* 397* 374*  BILITOT 8.5* 9.6*  13.1* 15.8* 14.8*  PROT 5.4* 4.9* 5.8* 5.7* 5.1*  ALBUMIN 2.8* 2.5* 2.8* 2.7* 2.4*    Recent Labs Lab 02/24/15 0423  LIPASE 46    Recent Labs Lab 02/28/15 1040  AMMONIA 38*   Coagulation profile  Recent Labs Lab 02/25/15 0447 02/26/15 0405 02/27/15 0529 02/28/15 0609 03/01/15 0530  INR 8.21* 5.03* 3.23* 2.49* 2.40*    CBC:  Recent Labs Lab 02/24/15 0423 02/25/15 0447 02/26/15 0405 02/27/15 0529 02/28/15 0609 03/01/15 0530  WBC 8.5 7.4 7.1 8.9 10.9* 8.1  NEUTROABS 6.3  --   --   --   --   --   HGB 12.5 12.0 11.6* 12.5 12.8 11.5*  HCT 38.1 36.9 35.3* 37.4 39.7 35.1*  MCV 88.6 88.7 88.5 87.6 88.2 88.0  PLT 187 195 174 229 264 192   Cardiac Enzymes: No results for input(s): CKTOTAL, CKMB, CKMBINDEX, TROPONINI in the last 168 hours. BNP: Invalid input(s): POCBNP CBG:  Recent Labs Lab 02/28/15 0728 02/28/15 1148 02/28/15 1738 02/28/15 2059 03/01/15 0734  GLUCAP 169* 189* 120* 192* 182*   D-Dimer No results for input(s): DDIMER in the last 72 hours. Hgb A1c No results for input(s): HGBA1C in the  last 72 hours. Lipid Profile No results for input(s): CHOL, HDL, LDLCALC, TRIG, CHOLHDL, LDLDIRECT in the last 72 hours. Thyroid function studies No results for input(s): TSH, T4TOTAL, T3FREE, THYROIDAB in the last 72 hours.  Invalid input(s): FREET3 Anemia work up No results for input(s): VITAMINB12, FOLATE, FERRITIN, TIBC, IRON, RETICCTPCT in the last 72 hours. Microbiology Recent Results (from the past 240 hour(s))  Urine culture     Status: None   Collection Time: 02/24/15  4:59 AM  Result Value Ref Range Status   Specimen Description URINE, CLEAN CATCH  Final   Special Requests Normal  Final   Culture   Final    MULTIPLE SPECIES PRESENT, SUGGEST RECOLLECTION Performed at [facility]    Report Status 02/25/2015 FINAL  Final      Studies:  No results found.  Assessment: 64 y.o. with metastatic pancreatic cancer, progression after  second line chemotherapy  1. 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 Obstructive jaundice, secondary to liver metastasis, tbil trending up  3. History of DVT, was on coumadin, held due to dupertherapeutic INR, received vitK  4. DM, HTN 5. Malnutrition  6. Abdominal and back pain secondary to metastatic cancer    Plan:  -Amber Bishop is going home with hospice. Her son is coming to see her in 2 days. She has Ray and friends from her church to support her at home.  -I will remain to be her MD when she is under hospice care -we discussed the option of anticoagulation. I think her risk of bleeding is probably out weight the benefit if she continues anticoagulation, given her coagulopathy from severe liver dysfunction. I will discontinue her coumadin -we also discuss the option of inpt hospice if she develops more symptoms. Her life expendency is likely to be a few weeks to a month.  -I think she is quite comfortable with her hospice decision.   Truitt Merle, MD 03/01/2015  9:34 PM

## 2015-03-01 NOTE — Discharge Summary (Signed)
Physician Discharge Summary  Amber Bishop RKY:[id number] DOB: Sep 19, 1950 DOA: 02/24/2015  PCP: [name], MD  Admit date: 02/24/2015 Discharge date: 03/01/2015  Recommendations for Outpatient Follow-up:  1. Home with family and friend assistance and home hospice:  [facility] 2. INR on 10/13 to be drawn by nurse and sent to Dr. Truitt Merle at [facility] for review:  [phone].  Patient would like to continue anticoagulation if and when INR < 2.    Discharge Diagnoses:  Active Problems:   Diabetes mellitus type 2, uncontrolled, without complications (HCC)   Pancreatic cancer metastasized to liver [facility])   Primary pancreatic cancer with metastasis to other site [facility])   DVT (deep venous thrombosis) (HCC)   Nausea and vomiting   Biliary obstruction   Malignant neoplasm of pancreas [facility])   Discharge Condition: stable, improved  Diet recommendation: regular  Wt Readings from Last 3 Encounters:  02/24/15 97.07 kg (214 lb)  02/03/15 99.655 kg (219 lb 11.2 oz)  01/19/15 102.785 kg (226 lb 9.6 oz)    History of present illness:   The patient is a 64 y.o. year-old female with history of pancreatic cancer followed by Dr. Burr Medico, hypertension, anxiety, diabetes mellitus type 2 who presents with nausea and vomiting. She was diagnosed with pancreatic cancer after she had a CT scan which demonstrated of pancreatic mass in March 2016. She completed 4 cycles of mFOLFOX but had progression of disease so she was started on second line chemotherapy with gemcitabine and Abraxane in June 2016. In the same month, she had a 1-1/2 week hospitalization for right lower extremity DVT and she was started on Coumadin. In September, CT scan demonstrated stable disease.   Over the last 1-2 months, she has had an 8 pound weight loss with early satiety, nausea, abdominal distension. Se has had severe 10 out of 10 back pain radiating from her lower cervical spine down  to her upper lumbar area the prevention her from sleeping. She has also had approximate 2 months of progressive epigastric abdominal pain. She presented to the hospital because of nausea and vomiting. In the ER, her labs were concerning for obstructive jaundice, INR of 8. 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 She appears to have progression of disease now with obstruction of biliary ducts.   Hospital Course:   Progressive metastatic pancreatic cancer with worsening back and abdominal pain, failed two lines of chemotherapy. She was admitted with pain, nausea, vomiting which were likely secondary to progressive cancer with peritoneal carcinomatosis and ascites but also due to a new biliary obstruction. Initially, we attempted to lower her INRs that she could have a biliary drain placed however after multiple conversations with the patient, palliative care, the hospitalist, the patient decided against doing any further procedures. She decided that she would like to go home with hospice to be as comfortable as possible.  For pain control, she responded well to IV Dilaudid and concentrated morphine.  She will be discharged home with a prescription for concentrated morphine to use as needed.   Nausea and vomiting with abdominal pain are likely secondary to biliary obstruction with elevated transaminitis/bilirubin which are rising as is her WBC count. Due to the acute change in her symptoms, she  was initially started on antibiotics for possible cholangitis, however she remained afebrile without leukocytosis. Therefore her antibiotics were discontinued. After her antibiotics were discontinued she had a rise in her  bilirubin and in her white blood cell count, however she remained afebrile. On the date of discharge, her bilirubin remained stable and her white blood cell count had decreased. She remained afebrile. She will be discharged to home with home hospice.  Elevated INR secondary to Coumadin therapy in the setting of progressive liver metastases, INR trended down gradually. She was given multiple doses of vitamin K however her INR at the date of discharge is still above 2. The patient stated that she would like to continue her anticoagulation to prevent worsening of her blood clots.  Recommend that she have an INR drawn in the next 1-2 days, the results to be called in to Dr. Truitt Merle at [phone].  Would not resume coumadin secondary to progressive liver disease.    Type 2 diabetes mellitus, insulin dependent, with hyperglycemia, CBG better controlled. Not eating or drinking reliably.  Her insulin dose was decreased to lantus 50 units daily at bedtime with instructions to take 10 units of aspart if she eats a full meal and her pre-meal blood sugar was greater than 180.    Essential hypertension, with elevated blood pressure.  D/c norvasc to focus on minimizing pill burden and despite stopping this medication, her blood pressure has been trending down.  She was given some lasix to help with lower extremity swelling.    DVT, at this point she would like to continue anticoagulation.  Hold anticoagulation for now given elevated INR.  She will have another INR check by hospice care which can be called in t  Depression/anxiety, pressured speech.  Given additional doses of Xanax per her home schedule, continued fluoxetine.    Consultants:  Medical Oncology, Dr. Burr Medico  Palliative care, Dr. Hilma Favors  IR  Procedures:  CT chest, abdomen, pelvis  Antibiotics:  Unasyn 10/6 > 10/8  Discharge Exam: Filed Vitals:   03/01/15 0541  BP: 120/61  Pulse: 84  Temp: 97.7 F (36.5 C)  Resp: 16   Filed  Vitals:   02/27/15 2110 02/28/15 0500 02/28/15 2100 03/01/15 0541  BP: 147/98 114/67 109/74 120/61  Pulse: 105 97 90 84  Temp: 98.3 F (36.8 C) 98.1 F (36.7 C) 97.7 F (36.5 C) 97.7 F (36.5 C)  TempSrc: Oral Oral Oral Oral  Resp: 18 18 18 16   Height:      Weight:      SpO2: 99% 96% 100% 96%     General: Adult female, jaundice and scleral icterus  HEENT: NCAT, MMM  Cardiovascular: RRR, nl S1, S2 no mrg, 2+ pulses, warm extremities  Respiratory: CTAB, no increased WOB  Abdomen: NABS, soft, moderately distended, TTP without rebound or guarding  MSK: Normal tone and bulk, 1+ pitting bilateral LEE  Neuro: Grossly intact, ambulating in halls  Discharge Instructions      Discharge Instructions    Call MD for:  difficulty breathing, headache or visual disturbances    Complete by:  As directed      Call MD for:  extreme fatigue    Complete by:  As directed      Call MD for:  hives    Complete by:  As directed      Call MD for:  persistant dizziness or light-headedness    Complete by:  As directed      Call MD for:  persistant nausea and vomiting    Complete by:  As directed      Call MD for:  severe uncontrolled pain    Complete by:  As directed      Call MD for:  temperature >100.4    Complete by:  As directed      Diet general    Complete by:  As directed      Discharge instructions    Complete by:  As directed   Please review your medication list carefully.  Please reduce your insulin.  Take 50 units of lantus and check your morning blood sugars.  If they are less than 100, then call the nurse line to ask for advice.  Only give yourself your Kinsler Soeder acting insulin if you eat a meal AND if your premeal blood sugar was greater than 180.  Administer your insulin immediately AFTER your meals now.  These changes are being made to avoid low blood sugars.  You will continue to require less and less insulin.  Stop your blood pressure medications.  Use lasix only as  needed for swelling.  If your nausea or pain are uncontrolled despite using your prescribed medications, please call the hospice line.  You will have an INR drawn by your nurse and Dr. Burr Medico can restart you on some anticoagulation if your INR is less than 2.     Driving Restrictions    Complete by:  As directed   No driving or operating heavy machinery or babysitting.     Increase activity slowly    Complete by:  As directed             Medication List    STOP taking these medications        amLODipine 5 MG tablet  Commonly known as:  NORVASC     CALCIUM 600/VITAMIN D 600-400 MG-UNIT tablet  Generic drug:  Calcium Carbonate-Vitamin D     CENTRUM SILVER PO     cetirizine 10 MG tablet  Commonly known as:  ZYRTEC     cholestyramine 4 G packet  Commonly known as:  QUESTRAN     Fish Oil 1000 MG Caps     FLUZONE HIGH-DOSE 0.5 ML Susy  Generic drug:  Influenza Vac Split High-Dose     valsartan-hydrochlorothiazide 320-12.5 MG tablet  Commonly known as:  DIOVAN-HCT      TAKE these medications        ALPRAZolam 0.5 MG tablet  Commonly known as:  XANAX  TAKE 1 TABLET THREE TIMES DAILY IF NEEDED FOR ANXIETY     blood glucose meter kit and supplies Kit  Dispense based on patient and insurance preference. Use up to four times daily as directed. Dx code: E11.65     famotidine 20 MG tablet  Commonly known as:  PEPCID  Take 1 tablet (20 mg total) by mouth 2 (two) times daily as needed for heartburn or indigestion.     FLUoxetine 20 MG capsule  Commonly known as:  PROZAC  Take 20 mg by mouth every morning.     furosemide 20 MG tablet  Commonly known as:  LASIX  Take 1 tablet (20 mg total) by mouth daily as needed for fluid (swelling).     glucose blood test strip  Commonly known as:  ACCU-CHEK AVIVA PLUS  Test 4 times daily as instructed.     insulin aspart 100 UNIT/ML FlexPen  Commonly known as:  NOVOLOG FLEXPEN  Inject 10 Units into the skin 3 (  three) times daily as  needed (if you eat a full meal.Do not give if premeal blood sugar is less than 180.).     insulin glargine 100 UNIT/ML injection  Commonly known as:  LANTUS  Inject 0.6 mLs (60 Units total) into the skin at bedtime.     Insulin Pen Needle 31G X 8 MM Misc  Commonly known as:  VALUMARK PEN NEEDLES  Use to inject insulin 4 times daily as instructed.     LANCETS ULTRA THIN 30G Misc  Use to test blood sugar 4 times daily as instructed. Dx code: E11.65     lidocaine-prilocaine cream  Commonly known as:  EMLA  Apply 1 application topically as needed.     morphine CONCENTRATE 10 MG/0.5ML Soln concentrated solution  Take 1 mL (20 mg total) by mouth every hour as needed for moderate pain, severe pain or shortness of breath.     ondansetron 4 MG disintegrating tablet  Commonly known as:  ZOFRAN ODT  Take 1 tablet (4 mg total) by mouth every 6 (six) hours as needed for nausea or vomiting.     OVER THE COUNTER MEDICATION  Apply 1 drop topically 3 (three) times daily. Essential Oils.     prochlorperazine 10 MG tablet  Commonly known as:  COMPAZINE  Take 1 tablet (10 mg total) by mouth every 6 (six) hours as needed for refractory nausea / vomiting.     traZODone 50 MG tablet  Commonly known as:  DESYREL  Take 0.5 tablets (25 mg total) by mouth at bedtime and may repeat dose one time if needed.       Follow-up Information    Follow up with [name], MD.   Specialty:  Family Medicine   Contact information:   [place] [address] [phone]       Follow up with Truitt Merle, MD In 2 weeks.   Specialties:  Hematology, Oncology   Contact information:   [place] [address] [phone]        The results of significant diagnostics from this hospitalization (including imaging, microbiology, ancillary and laboratory) are listed below for reference.    Significant Diagnostic Studies: Ct Chest W Contrast  02/24/2015   CLINICAL  DATA:  64 year old female with known metastatic pancreatic cancer and new symptoms of abdominal pain, nausea and vomiting  EXAM: CT CHEST, ABDOMEN, AND PELVIS WITH CONTRAST  TECHNIQUE: Multidetector CT imaging of the chest, abdomen and pelvis was performed following the standard protocol during bolus administration of intravenous contrast.  CONTRAST:  162m OMNIPAQUE IOHEXOL 300 MG/ML  SOLN  COMPARISON:  Most recent CT abdomen/pelvis 12/27/2014; prior CT scan of the chest 10/26/2014  FINDINGS: CT CHEST FINDINGS  Mediastinum/Nodes: Unremarkable CT appearance of the thyroid gland. No suspicious mediastinal or hilar adenopathy. No soft tissue mediastinal mass. The thoracic esophagus is unremarkable.  Lungs/Pleura: Scattered foci of ground-glass attenuation opacity in the right upper lobe and along the major fissure are similar to slightly more conspicuous than previously seen. There are 2 subpleural nodules within the major fissure which were previously barely detectable but have since enlarged measuring up to 4 mm. No definite new intraparenchymal pulmonary nodule. No new airspace consolidation, pulmonary edema, pleural effusion or pneumothorax. Right IJ approach single-lumen power injectable port catheter. Catheter tip terminates at the superior cavoatrial junction. No evidence of aneurysm. The heart is within normal limits for size. No pericardial effusion.  Musculoskeletal: Stable sclerotic focus in the  superior endplate of T4. The sclerotic focus in the lateral aspect of the L1 vertebral body has enlarged compared 12/27/2014. The lesion now measures up to 1.5 cm compared to 1.0 cm previously. No acute fracture.  CT ABDOMEN PELVIS FINDINGS  Hepatobiliary: Similar degree of left-sided biliary ductal dilatation. The segments 2 and 3 biliary ducts appear obstructed by an a amorphous hepatic metastasis. Newly developing central biliary ductal dilatation in the anterior and posterior segmental ducts of the right hepatic  lobe. This was not previously present. There are innumerable hepatic lesions bilaterally which are enlarging. An index lesion within the right hepatic lobe has increased from approximately 1.8 cm to 2.5 cm. A second index lesion in the anterior aspect of hepatic segment 5 now measures 2.7 cm compared to 2.1 cm previously. A lesion in the deep aspect of hepatic segment 4B is similar at 2.8 cm compared to 2.7 cm previously. A lesion in the periphery of the liver at the interface of segment 5 and 6 now measures 3.5 cm compared to 2.3 cm. Multiple other lesions also demonstrate interval enlargement.  Interval development of mild perihepatic ascites. The gallbladder is decompressed.  Pancreas: An amorphous slightly hypoenhancing mass in the body of the pancreas is less well-defined than on prior imaging and difficult to measure. The mass measures approximately 4.8 x 3.9 cm compared to 5.0 x 3.1 cm. It appears slightly more full. The pancreatic tail distal to this lesion is atrophic with mild ductal dilatation. This is similar compared to prior.  Spleen: No focal splenic abnormality.  Trace perisplenic ascites.  Adrenals/Urinary Tract: The adrenal glands are within normal limits. No enhancing renal mass, hydronephrosis or nephrolithiasis. Stable 5.3 cm cyst exophytic from the lower pole of the right kidney.  Stomach/Bowel: Unremarkable CT appearance of the stomach, duodenum, and intestine. No focal bowel wall thickening or evidence of obstruction. Colonic diverticular disease without CT evidence of active inflammation. Mild stranding is present within the small bowel mesentery and ascending colon mesial colon. This was not previously present.  Vascular/Lymphatic: Enlarging para-aortic lymph nodes. A right celiac station node now measures 1.4 x 0.8 cm compared to being barely detectable at a maximum of 7 mm previously. This node is adjacent to the left adrenal gland on image 59 of series 3.  Reproductive: Unremarkable  bladder, uterus and adnexa. Small free fluid within the pelvis.  Other: Subtle enhancing nodularity along the peritoneal surface adjacent to the liver in the right perihepatic space (images 65, 71, 72, 73 and 75 of series 3).  Musculoskeletal: Stable mixed lytic and sclerotic lesion in the left femoral head. New sclerotic metastatic lesion in the inferior left ischial tuberosity measuring 7 mm. Slightly enlarged mixed lytic and sclerotic lesion in the posterior aspect of the L2 vertebral body. This is best demonstrated on the sagittal reformatted images were the lesion measures 2 cm. Stable reticulation of the subcutaneous fat with multiple small nodules and focal skin thickening. This is not significantly different compared to prior.  IMPRESSION: 1. There has been interval progression of metastatic disease compared 12/27/2014. Specifically, there has been interval enlargement of numerous hepatic metastatic lesions as well as interval development of small volume ascites and subtle nodularity of the peritoneal surface in the perihepatic space and throughout the mesentery concerning for peritoneal carcinomatosis. Additionally, there is increasing retroperitoneal adenopathy and progressive osseous metastatic disease at L1, L2 and in the left ischial tuberosity. 2. Slightly increased subpleural nodularity along the major fissure of the right lung may represent  increasing prominence of subpleural lymph nodes due to an underlying infectious/inflammatory process, or an additional site of progressive metastatic disease. 3. New developing biliary ductal dilatation of the central anterior and posterior segmental bile ducts in the right biliary tree. These were previously normal. The left intrahepatic biliary ductal dilatation remains stable. 4. Similar to slightly enlarged amorphous hypoenhancing mass in the body of the pancreas consistent with the primary pancreatic neoplasm. 5. Colonic diverticular disease without CT  evidence of active inflammation. 6. Stable skin thickening with reticulation of the underlying subcutaneous fat and nodularity in the right lower quadrant anterior abdominal wall. This may represent reactive changes if the patient is undergoing subcutaneous medicine injections. If there are no injections in this region, this could represent a site of cutaneous metastatic disease.   Electronically Signed   By: Jacqulynn Cadet M.D.   On: 02/24/2015 08:14   Ct Abdomen Pelvis W Contrast  02/24/2015   CLINICAL DATA:  64 year old female with known metastatic pancreatic cancer and new symptoms of abdominal pain, nausea and vomiting  EXAM: CT CHEST, ABDOMEN, AND PELVIS WITH CONTRAST  TECHNIQUE: Multidetector CT imaging of the chest, abdomen and pelvis was performed following the standard protocol during bolus administration of intravenous contrast.  CONTRAST:  13m OMNIPAQUE IOHEXOL 300 MG/ML  SOLN  COMPARISON:  Most recent CT abdomen/pelvis 12/27/2014; prior CT scan of the chest 10/26/2014  FINDINGS: CT CHEST FINDINGS  Mediastinum/Nodes: Unremarkable CT appearance of the thyroid gland. No suspicious mediastinal or hilar adenopathy. No soft tissue mediastinal mass. The thoracic esophagus is unremarkable.  Lungs/Pleura: Scattered foci of ground-glass attenuation opacity in the right upper lobe and along the major fissure are similar to slightly more conspicuous than previously seen. There are 2 subpleural nodules within the major fissure which were previously barely detectable but have since enlarged measuring up to 4 mm. No definite new intraparenchymal pulmonary nodule. No new airspace consolidation, pulmonary edema, pleural effusion or pneumothorax. Right IJ approach single-lumen power injectable port catheter. Catheter tip terminates at the superior cavoatrial junction. No evidence of aneurysm. The heart is within normal limits for size. No pericardial effusion.  Musculoskeletal: Stable sclerotic focus in the  superior endplate of T4. The sclerotic focus in the lateral aspect of the L1 vertebral body has enlarged compared 12/27/2014. The lesion now measures up to 1.5 cm compared to 1.0 cm previously. No acute fracture.  CT ABDOMEN PELVIS FINDINGS  Hepatobiliary: Similar degree of left-sided biliary ductal dilatation. The segments 2 and 3 biliary ducts appear obstructed by an a amorphous hepatic metastasis. Newly developing central biliary ductal dilatation in the anterior and posterior segmental ducts of the right hepatic lobe. This was not previously present. There are innumerable hepatic lesions bilaterally which are enlarging. An index lesion within the right hepatic lobe has increased from approximately 1.8 cm to 2.5 cm. A second index lesion in the anterior aspect of hepatic segment 5 now measures 2.7 cm compared to 2.1 cm previously. A lesion in the deep aspect of hepatic segment 4B is similar at 2.8 cm compared to 2.7 cm previously. A lesion in the periphery of the liver at the interface of segment 5 and 6 now measures 3.5 cm compared to 2.3 cm. Multiple other lesions also demonstrate interval enlargement.  Interval development of mild perihepatic ascites. The gallbladder is decompressed.  Pancreas: An amorphous slightly hypoenhancing mass in the body of the pancreas is less well-defined than on prior imaging and difficult to measure. The mass measures approximately 4.8 x 3.9  cm compared to 5.0 x 3.1 cm. It appears slightly more full. The pancreatic tail distal to this lesion is atrophic with mild ductal dilatation. This is similar compared to prior.  Spleen: No focal splenic abnormality.  Trace perisplenic ascites.  Adrenals/Urinary Tract: The adrenal glands are within normal limits. No enhancing renal mass, hydronephrosis or nephrolithiasis. Stable 5.3 cm cyst exophytic from the lower pole of the right kidney.  Stomach/Bowel: Unremarkable CT appearance of the stomach, duodenum, and intestine. No focal bowel wall  thickening or evidence of obstruction. Colonic diverticular disease without CT evidence of active inflammation. Mild stranding is present within the small bowel mesentery and ascending colon mesial colon. This was not previously present.  Vascular/Lymphatic: Enlarging para-aortic lymph nodes. A right celiac station node now measures 1.4 x 0.8 cm compared to being barely detectable at a maximum of 7 mm previously. This node is adjacent to the left adrenal gland on image 59 of series 3.  Reproductive: Unremarkable bladder, uterus and adnexa. Small free fluid within the pelvis.  Other: Subtle enhancing nodularity along the peritoneal surface adjacent to the liver in the right perihepatic space (images 65, 71, 72, 73 and 75 of series 3).  Musculoskeletal: Stable mixed lytic and sclerotic lesion in the left femoral head. New sclerotic metastatic lesion in the inferior left ischial tuberosity measuring 7 mm. Slightly enlarged mixed lytic and sclerotic lesion in the posterior aspect of the L2 vertebral body. This is best demonstrated on the sagittal reformatted images were the lesion measures 2 cm. Stable reticulation of the subcutaneous fat with multiple small nodules and focal skin thickening. This is not significantly different compared to prior.  IMPRESSION: 1. There has been interval progression of metastatic disease compared 12/27/2014. Specifically, there has been interval enlargement of numerous hepatic metastatic lesions as well as interval development of small volume ascites and subtle nodularity of the peritoneal surface in the perihepatic space and throughout the mesentery concerning for peritoneal carcinomatosis. Additionally, there is increasing retroperitoneal adenopathy and progressive osseous metastatic disease at L1, L2 and in the left ischial tuberosity. 2. Slightly increased subpleural nodularity along the major fissure of the right lung may represent increasing prominence of subpleural lymph nodes due  to an underlying infectious/inflammatory process, or an additional site of progressive metastatic disease. 3. New developing biliary ductal dilatation of the central anterior and posterior segmental bile ducts in the right biliary tree. These were previously normal. The left intrahepatic biliary ductal dilatation remains stable. 4. Similar to slightly enlarged amorphous hypoenhancing mass in the body of the pancreas consistent with the primary pancreatic neoplasm. 5. Colonic diverticular disease without CT evidence of active inflammation. 6. Stable skin thickening with reticulation of the underlying subcutaneous fat and nodularity in the right lower quadrant anterior abdominal wall. This may represent reactive changes if the patient is undergoing subcutaneous medicine injections. If there are no injections in this region, this could represent a site of cutaneous metastatic disease.   Electronically Signed   By: Jacqulynn Cadet M.D.   On: 02/24/2015 08:14    Microbiology: Recent Results (from the past 240 hour(s))  Urine culture     Status: None   Collection Time: 02/24/15  4:59 AM  Result Value Ref Range Status   Specimen Description URINE, CLEAN CATCH  Final   Special Requests Normal  Final   Culture   Final    MULTIPLE SPECIES PRESENT, SUGGEST RECOLLECTION Performed at [facility]    Report Status 02/25/2015 FINAL  Final  Labs: Basic Metabolic Panel:  Recent Labs Lab 02/25/15 0447 02/26/15 0405 02/27/15 0529 02/28/15 0609 03/01/15 0530  NA 139 139 140 138 138  K 3.8 3.8 3.6 3.6 3.6  CL 104 106 104 104 104  CO2 29 27 28 26 28   GLUCOSE 238* 215* 134* 184* 200*  BUN 11 11 11 14 19   CREATININE 0.30* 0.42* <0.30* <0.30* 0.30*  CALCIUM 8.0* 7.6* 8.0* 8.0* 8.0*   Liver Function Tests:  Recent Labs Lab 02/25/15 0447 02/26/15 0405 02/27/15 0529 02/28/15 0609 03/01/15 0530  AST 153* 146* 183* 189* 173*  ALT 170* 150* 171* 169* 150*  ALKPHOS 337* 312* 364* 397* 374*   BILITOT 8.5* 9.6* 13.1* 15.8* 14.8*  PROT 5.4* 4.9* 5.8* 5.7* 5.1*  ALBUMIN 2.8* 2.5* 2.8* 2.7* 2.4*    Recent Labs Lab 02/24/15 0423  LIPASE 46    Recent Labs Lab 02/28/15 1040  AMMONIA 38*   CBC:  Recent Labs Lab 02/24/15 0423 02/25/15 0447 02/26/15 0405 02/27/15 0529 02/28/15 0609 03/01/15 0530  WBC 8.5 7.4 7.1 8.9 10.9* 8.1  NEUTROABS 6.3  --   --   --   --   --   HGB 12.5 12.0 11.6* 12.5 12.8 11.5*  HCT 38.1 36.9 35.3* 37.4 39.7 35.1*  MCV 88.6 88.7 88.5 87.6 88.2 88.0  PLT 187 195 174 229 264 192   Cardiac Enzymes: No results for input(s): CKTOTAL, CKMB, CKMBINDEX, TROPONINI in the last 168 hours. BNP: BNP (last 3 results) No results for input(s): BNP in the last 8760 hours.  ProBNP (last 3 results) No results for input(s): PROBNP in the last 8760 hours.  CBG:  Recent Labs Lab 02/28/15 0728 02/28/15 1148 02/28/15 1738 02/28/15 2059 03/01/15 0734  GLUCAP 169* 189* 120* 192* 182*    Time coordinating discharge: 35 minutes  Signed:  Hever Castilleja  [facility] 03/01/2015, 11:14 AM

## 2015-03-02 LAB — GLUCOSE, CAPILLARY: Glucose-Capillary: 193 mg/dL — ABNORMAL HIGH (ref 65–99)

## 2015-03-03 ENCOUNTER — Ambulatory Visit: Payer: PPO

## 2015-03-03 ENCOUNTER — Other Ambulatory Visit: Payer: PPO

## 2015-03-03 ENCOUNTER — Ambulatory Visit: Payer: PPO | Admitting: Hematology

## 2015-03-08 ENCOUNTER — Telehealth: Payer: Self-pay | Admitting: *Deleted

## 2015-03-08 ENCOUNTER — Other Ambulatory Visit: Payer: Self-pay | Admitting: Hematology

## 2015-03-08 NOTE — Telephone Encounter (Signed)
Received call from daughter Rachelann Enloe stating that pt is now with hospice; pt is very weak, and cannot walk.  Pt will not likely to keep appt for 07-Apr-2015.  Informed Romie Minus not to worry about appt on April 07, 2015.   Dr. Burr Medico will be notified and appt will be cancelled.  Romie Minus voiced understanding. [name][facility]  Phone     [phone].

## 2015-03-09 ENCOUNTER — Telehealth: Payer: Self-pay | Admitting: Hematology

## 2015-03-09 NOTE — Telephone Encounter (Signed)
cld pt to adv Dr Allen Kell Cx labs for 10/20

## 2015-03-10 ENCOUNTER — Other Ambulatory Visit: Payer: PPO

## 2015-03-22 DEATH — deceased

## 2015-04-04 NOTE — ED Provider Notes (Signed)
CSN: [id number]     Arrival date & time 02/24/15  0330 History   First MD Initiated Contact with Patient 02/24/15 0350     Chief Complaint  Patient presents with  . Emesis     (Consider location/radiation/quality/duration/timing/severity/associated sxs/prior Treatment) Emesis Patient is a 64 y.o. female presenting with vomiting.     Amber Bishop is a 64 y.o. female who presents for evaluation of abdominal pain, vomiting, and "upset stomach." No fever, cough, chest pain, weakness or dizziness. She is being actively treated for pancreatitis, and has a CT scan, scheduled for later today. There are no other known modifying factors.   Past Medical History  Diagnosis Date  . Anxiety   . Hypertension   . IBS (irritable bowel syndrome)   . Hyperplastic colon polyp   . Allergy   . Anemia   . Arthritis   . Diverticulosis   . Dilated aortic root ([place])     seen on prior echo but echo 05/2013 showed normal dimensions  . Bicuspid aortic valve   . LVE (left ventricular enlargement)     mild by echo 1/15 with EF 50-55%  . pancreatic ca w/ liver mets dx'd 07/2014  . Diabetes mellitus without complication [facility])    Past Surgical History  Procedure Laterality Date  . Tubal ligation      age 9  . Colonoscopy    . Polypectomy    . Tooth extraction      3 teeth  . Belpharoptosis repair     Family History  Problem Relation Age of Onset  . Colon cancer Neg Hx   . Esophageal cancer Neg Hx   . Stomach cancer Neg Hx   . Asthma Mother    Social History  Substance Use Topics  . Smoking status: Never Smoker   . Smokeless tobacco: Never Used  . Alcohol Use: No   OB History    No data available     Review of Systems  Gastrointestinal: Positive for vomiting.  All other systems reviewed and are negative.     Allergies  Naprosyn and Metformin and related  Home Medications   Prior to Admission medications   Medication Sig Start Date End Date Taking? Authorizing Provider   FLUoxetine (PROZAC) 20 MG capsule Take 20 mg by mouth every morning.    Yes Historical Provider, MD  lidocaine-prilocaine (EMLA) cream Apply 1 application topically as needed. Patient taking differently: Apply 1 application topically as needed (port).  12/15/14  Yes Truitt Merle, MD  OVER THE COUNTER MEDICATION Apply 1 drop topically 3 (three) times daily. Essential Oils.   Yes Historical Provider, MD  ALPRAZolam Duanne Moron) 0.5 MG tablet TAKE 1 TABLET THREE TIMES DAILY IF NEEDED FOR ANXIETY 03/01/15   Janece Canterbury, MD  blood glucose meter kit and supplies KIT Dispense based on patient and insurance preference. Use up to four times daily as directed. Dx code: E11.65 07/15/14   Philemon Kingdom, MD  famotidine (PEPCID) 20 MG tablet Take 1 tablet (20 mg total) by mouth 2 (two) times daily as needed for heartburn or indigestion. 03/01/15   Janece Canterbury, MD  furosemide (LASIX) 20 MG tablet Take 1 tablet (20 mg total) by mouth daily as needed for fluid (swelling). 03/01/15   Janece Canterbury, MD  glucose blood (ACCU-CHEK AVIVA PLUS) test strip Test 4 times daily as instructed. 06/28/14   Philemon Kingdom, MD  insulin aspart (NOVOLOG FLEXPEN) 100 UNIT/ML FlexPen Inject 10 Units into the skin 3 (three) times  daily as needed (if you eat a full meal.Do not give if premeal blood sugar is less than 180.). 03/01/15   Janece Canterbury, MD  insulin glargine (LANTUS) 100 UNIT/ML injection Inject 0.6 mLs (60 Units total) into the skin at bedtime. 03/01/15   Janece Canterbury, MD  Insulin Pen Needle (VALUMARK PEN NEEDLES) 31G X 8 MM MISC Use to inject insulin 4 times daily as instructed. 03/18/14   Philemon Kingdom, MD  LANCETS ULTRA THIN 30G MISC Use to test blood sugar 4 times daily as instructed. Dx code: E11.65 07/15/14   Philemon Kingdom, MD  Morphine Sulfate (MORPHINE CONCENTRATE) 10 MG/0.5ML SOLN concentrated solution Take 1 mL (20 mg total) by mouth every hour as needed for moderate pain, severe pain or shortness of  breath. 03/01/15   Janece Canterbury, MD  ondansetron (ZOFRAN ODT) 4 MG disintegrating tablet Take 1 tablet (4 mg total) by mouth every 6 (six) hours as needed for nausea or vomiting. 03/01/15   Janece Canterbury, MD  prochlorperazine (COMPAZINE) 10 MG tablet Take 1 tablet (10 mg total) by mouth every 6 (six) hours as needed for refractory nausea / vomiting. 03/01/15   Janece Canterbury, MD  traZODone (DESYREL) 50 MG tablet Take 0.5 tablets (25 mg total) by mouth at bedtime and may repeat dose one time if needed. 03/01/15   Janece Canterbury, MD   BP 120/61 mmHg  Pulse 84  Temp(Src) 97.7 F (36.5 C) (Oral)  Resp 16  Ht 5' 4"  (1.626 m)  Wt 214 lb (97.07 kg)  BMI 36.72 kg/m2  SpO2 96% Physical Exam  Constitutional: She is oriented to person, place, and time. She appears well-developed and well-nourished. She appears distressed (She is uncomfortable).  HENT:  Head: Normocephalic and atraumatic.  Right Ear: External ear normal.  Left Ear: External ear normal.  Eyes: Conjunctivae and EOM are normal. Pupils are equal, round, and reactive to light.  Neck: Normal range of motion and phonation normal. Neck supple.  Cardiovascular: Normal rate, regular rhythm and normal heart sounds.   Pulmonary/Chest: Effort normal and breath sounds normal. She exhibits no bony tenderness.  Abdominal: Soft. There is tenderness (Moderate diffuse, greater in the upper quadrants bilaterally.).  Musculoskeletal: Normal range of motion.  Neurological: She is alert and oriented to person, place, and time. No cranial nerve deficit or sensory deficit. She exhibits normal muscle tone. Coordination normal.  Skin: Skin is warm, dry and intact.  Psychiatric: She has a normal mood and affect. Her behavior is normal. Judgment and thought content normal.  Nursing note and vitals reviewed.   ED Course  Procedures (including critical care time) Labs Review Labs Reviewed  COMPREHENSIVE METABOLIC PANEL - Abnormal; Notable for the  following:    Chloride 99 (*)    Glucose, Bld 267 (*)    Creatinine, Ser 0.40 (*)    Calcium 8.4 (*)    Total Protein 6.1 (*)    Albumin 3.3 (*)    AST 180 (*)    ALT 190 (*)    Alkaline Phosphatase 366 (*)    Total Bilirubin 7.1 (*)    All other components within normal limits  CBC WITH DIFFERENTIAL/PLATELET - Abnormal; Notable for the following:    RDW 17.6 (*)    All other components within normal limits  URINALYSIS, ROUTINE W REFLEX MICROSCOPIC (NOT AT [facility]) - Abnormal; Notable for the following:    Color, Urine ORANGE (*)    APPearance CLOUDY (*)    Specific Gravity, Urine 1.031 (*)  Bilirubin Urine MODERATE (*)    Ketones, ur 40 (*)    Protein, ur 30 (*)    Nitrite POSITIVE (*)    Leukocytes, UA SMALL (*)    All other components within normal limits  URINE MICROSCOPIC-ADD ON - Abnormal; Notable for the following:    Squamous Epithelial / LPF MANY (*)    Bacteria, UA MANY (*)    All other components within normal limits  PROTIME-INR - Abnormal; Notable for the following:    Prothrombin Time 64.0 (*)    INR 8.00 (*)    All other components within normal limits  APTT - Abnormal; Notable for the following:    aPTT 48 (*)    All other components within normal limits  GLUCOSE, CAPILLARY - Abnormal; Notable for the following:    Glucose-Capillary 373 (*)    All other components within normal limits  COMPREHENSIVE METABOLIC PANEL - Abnormal; Notable for the following:    Glucose, Bld 238 (*)    Creatinine, Ser 0.30 (*)    Calcium 8.0 (*)    Total Protein 5.4 (*)    Albumin 2.8 (*)    AST 153 (*)    ALT 170 (*)    Alkaline Phosphatase 337 (*)    Total Bilirubin 8.5 (*)    All other components within normal limits  CBC - Abnormal; Notable for the following:    RDW 17.8 (*)    All other components within normal limits  PROTIME-INR - Abnormal; Notable for the following:    Prothrombin Time 65.2 (*)    INR 8.21 (*)    All other components within normal limits   GLUCOSE, CAPILLARY - Abnormal; Notable for the following:    Glucose-Capillary 207 (*)    All other components within normal limits  GLUCOSE, CAPILLARY - Abnormal; Notable for the following:    Glucose-Capillary 347 (*)    All other components within normal limits  GLUCOSE, CAPILLARY - Abnormal; Notable for the following:    Glucose-Capillary 216 (*)    All other components within normal limits  GLUCOSE, CAPILLARY - Abnormal; Notable for the following:    Glucose-Capillary 285 (*)    All other components within normal limits  PROTIME-INR - Abnormal; Notable for the following:    Prothrombin Time 45.1 (*)    INR 5.03 (*)    All other components within normal limits  COMPREHENSIVE METABOLIC PANEL - Abnormal; Notable for the following:    Glucose, Bld 215 (*)    Creatinine, Ser 0.42 (*)    Calcium 7.6 (*)    Total Protein 4.9 (*)    Albumin 2.5 (*)    AST 146 (*)    ALT 150 (*)    Alkaline Phosphatase 312 (*)    Total Bilirubin 9.6 (*)    All other components within normal limits  CBC - Abnormal; Notable for the following:    Hemoglobin 11.6 (*)    HCT 35.3 (*)    RDW 18.0 (*)    All other components within normal limits  GLUCOSE, CAPILLARY - Abnormal; Notable for the following:    Glucose-Capillary 171 (*)    All other components within normal limits  GLUCOSE, CAPILLARY - Abnormal; Notable for the following:    Glucose-Capillary 188 (*)    All other components within normal limits  GLUCOSE, CAPILLARY - Abnormal; Notable for the following:    Glucose-Capillary 213 (*)    All other components within normal limits  GLUCOSE, CAPILLARY - Abnormal;  Notable for the following:    Glucose-Capillary 243 (*)    All other components within normal limits  GLUCOSE, CAPILLARY - Abnormal; Notable for the following:    Glucose-Capillary 215 (*)    All other components within normal limits  PROTIME-INR - Abnormal; Notable for the following:    Prothrombin Time 32.3 (*)    INR 3.23 (*)     All other components within normal limits  COMPREHENSIVE METABOLIC PANEL - Abnormal; Notable for the following:    Glucose, Bld 134 (*)    Creatinine, Ser <0.30 (*)    Calcium 8.0 (*)    Total Protein 5.8 (*)    Albumin 2.8 (*)    AST 183 (*)    ALT 171 (*)    Alkaline Phosphatase 364 (*)    Total Bilirubin 13.1 (*)    All other components within normal limits  CBC - Abnormal; Notable for the following:    RDW 18.3 (*)    All other components within normal limits  GLUCOSE, CAPILLARY - Abnormal; Notable for the following:    Glucose-Capillary 231 (*)    All other components within normal limits  GLUCOSE, CAPILLARY - Abnormal; Notable for the following:    Glucose-Capillary 153 (*)    All other components within normal limits  GLUCOSE, CAPILLARY - Abnormal; Notable for the following:    Glucose-Capillary 207 (*)    All other components within normal limits  PROTIME-INR - Abnormal; Notable for the following:    Prothrombin Time 26.6 (*)    INR 2.49 (*)    All other components within normal limits  COMPREHENSIVE METABOLIC PANEL - Abnormal; Notable for the following:    Glucose, Bld 184 (*)    Creatinine, Ser <0.30 (*)    Calcium 8.0 (*)    Total Protein 5.7 (*)    Albumin 2.7 (*)    AST 189 (*)    ALT 169 (*)    Alkaline Phosphatase 397 (*)    Total Bilirubin 15.8 (*)    All other components within normal limits  CBC - Abnormal; Notable for the following:    WBC 10.9 (*)    RDW 18.9 (*)    All other components within normal limits  GLUCOSE, CAPILLARY - Abnormal; Notable for the following:    Glucose-Capillary 147 (*)    All other components within normal limits  AMMONIA - Abnormal; Notable for the following:    Ammonia 38 (*)    All other components within normal limits  GLUCOSE, CAPILLARY - Abnormal; Notable for the following:    Glucose-Capillary 169 (*)    All other components within normal limits  GLUCOSE, CAPILLARY - Abnormal; Notable for the following:     Glucose-Capillary 189 (*)    All other components within normal limits  PROTIME-INR - Abnormal; Notable for the following:    Prothrombin Time 25.8 (*)    INR 2.40 (*)    All other components within normal limits  COMPREHENSIVE METABOLIC PANEL - Abnormal; Notable for the following:    Glucose, Bld 200 (*)    Creatinine, Ser 0.30 (*)    Calcium 8.0 (*)    Total Protein 5.1 (*)    Albumin 2.4 (*)    AST 173 (*)    ALT 150 (*)    Alkaline Phosphatase 374 (*)    Total Bilirubin 14.8 (*)    All other components within normal limits  CBC - Abnormal; Notable for the following:    Hemoglobin  11.5 (*)    HCT 35.1 (*)    RDW 19.4 (*)    All other components within normal limits  GLUCOSE, CAPILLARY - Abnormal; Notable for the following:    Glucose-Capillary 120 (*)    All other components within normal limits  GLUCOSE, CAPILLARY - Abnormal; Notable for the following:    Glucose-Capillary 192 (*)    All other components within normal limits  GLUCOSE, CAPILLARY - Abnormal; Notable for the following:    Glucose-Capillary 182 (*)    All other components within normal limits  GLUCOSE, CAPILLARY - Abnormal; Notable for the following:    Glucose-Capillary 193 (*)    All other components within normal limits  CBG MONITORING, ED - Abnormal; Notable for the following:    Glucose-Capillary 264 (*)    All other components within normal limits  URINE CULTURE  LIPASE, BLOOD  GLUCOSE, CAPILLARY    Imaging Review No results found. I have personally reviewed and evaluated these images and lab results as part of my medical decision-making.   EKG Interpretation None      MDM   Final diagnoses:  Epigastric pain  Malignant neoplasm of pancreas, unspecified location of malignancy (HCC)    Nonspecific upper abdominal pain, with history of pancreatic cancer, currently under treatment. Patient was already scheduled for CT imaging, today, so as images were ordered in the emergency department to  evaluate her pain.  Nursing Notes Reviewed/ Care Coordinated, and agree without changes. Applicable Imaging Reviewed.  Interpretation of Laboratory Data incorporated into ED treatment  06:40- care to Dr. Vanita Panda to evaluate after return of imaging, and reassess the patient.    Daleen Bo, MD 02/25/15 2301  Daleen Bo, MD 04/07/15 [phone]

## 2015-04-11 ENCOUNTER — Other Ambulatory Visit: Payer: Self-pay

## 2015-04-11 NOTE — Patient Outreach (Addendum)
[facility]) Care Management  04/11/2015  Amber Bishop 1951-04-05 [id number]   Telephone call for HTA Tier 3 list.  Spoke with patient's daughter in law Pingree.  She states that patient passed away about 3 weeks ago.  Condolences offered.    Plan: RN Health Coach will send in basket to Lurline Del, Care Management Assistant for case closure.    Jone Baseman, RN, MSN [place] [phone]

## 2015-04-12 ENCOUNTER — Ambulatory Visit: Payer: PPO | Admitting: Gastroenterology

## 2015-12-15 IMAGING — CT CT ABD-PELV W/ CM
2 of 5 series · 14 of 36 positions shown, 17 images · IV contrast (OMNIPAQUE)
Comparison: 08/10/2014.

CLINICAL DATA: Subsequent encounter for pancreatic cancer with
liver metastases.

EXAM:
CT CHEST, ABDOMEN, AND PELVIS WITH CONTRAST
TECHNIQUE: Multidetector CT imaging of the chest, abdomen and pelvis was
performed following the standard protocol during bolus
administration of intravenous contrast.
CONTRAST:  100mL OMNIPAQUE IOHEXOL 300 MG/ML  SOLN

[Series 2: cap with st · axial · 0.87mm/px · z∈[-612,-62]mm · 11 of 128 slices shown, 14 images]
[im 9/128  mediastinal]
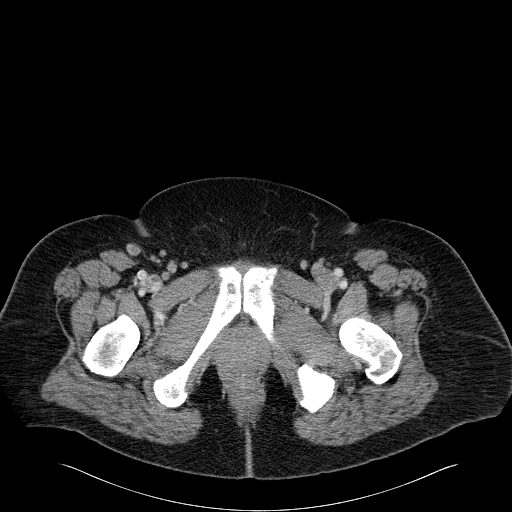
[im 9/128  lung]
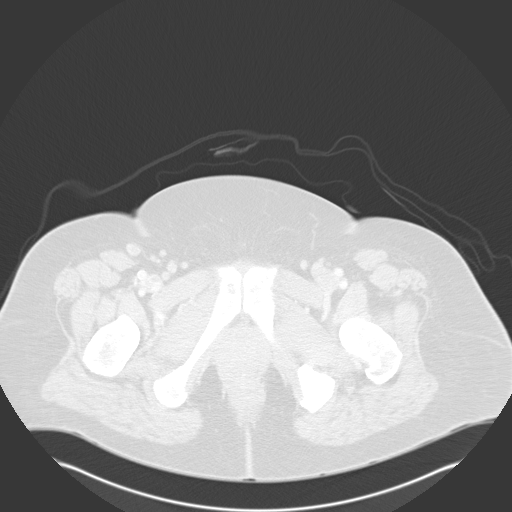
[im 17/128  lung]
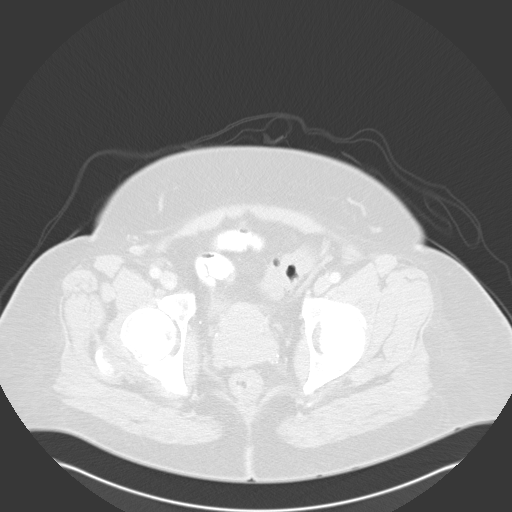
[im 34/128  lung]
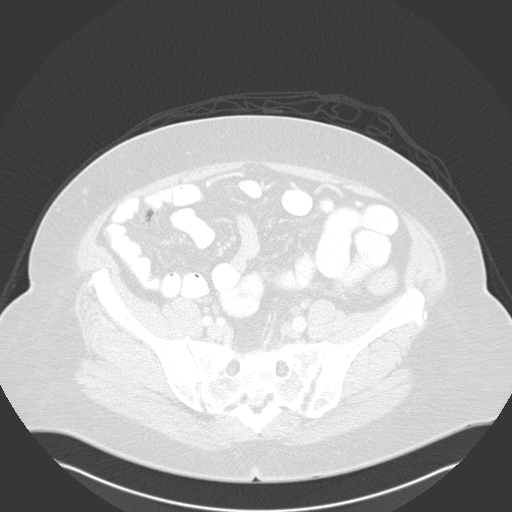
[im 43/128  lung]
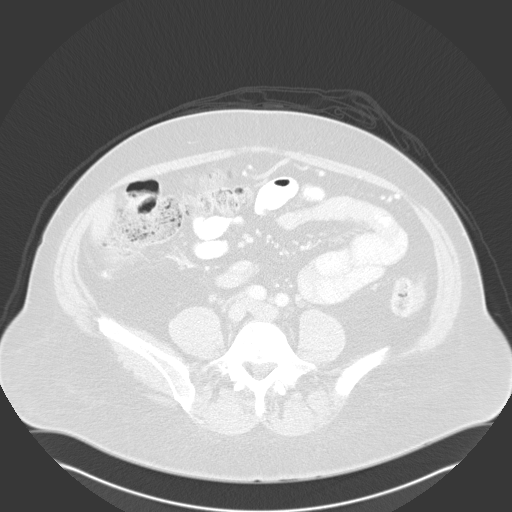
[im 51/128  mediastinal]
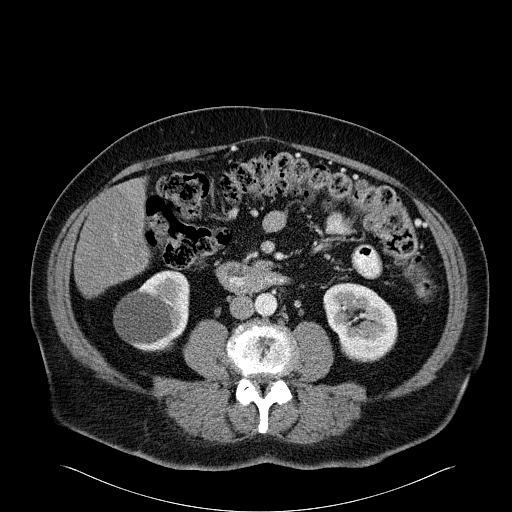
[im 51/128  lung]
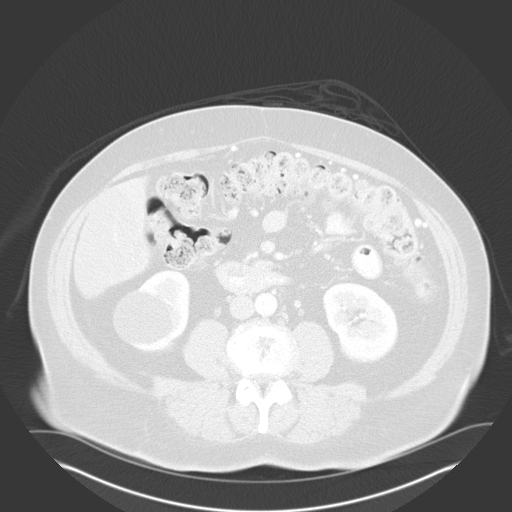
[im 68/128  lung]
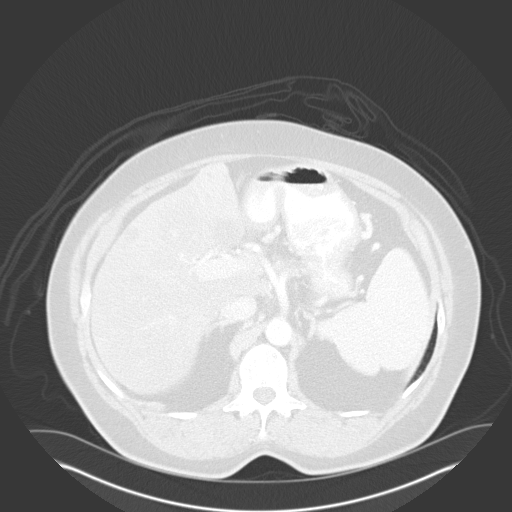
[im 77/128  lung]
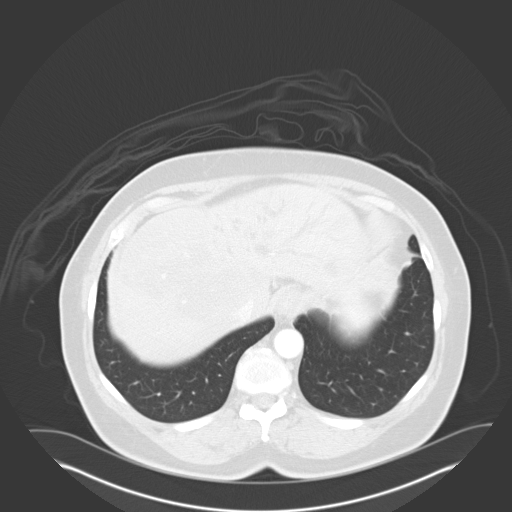
[im 85/128  lung]
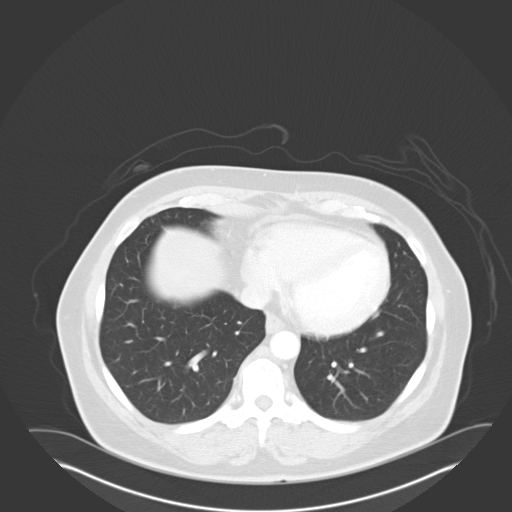
[im 94/128  mediastinal]
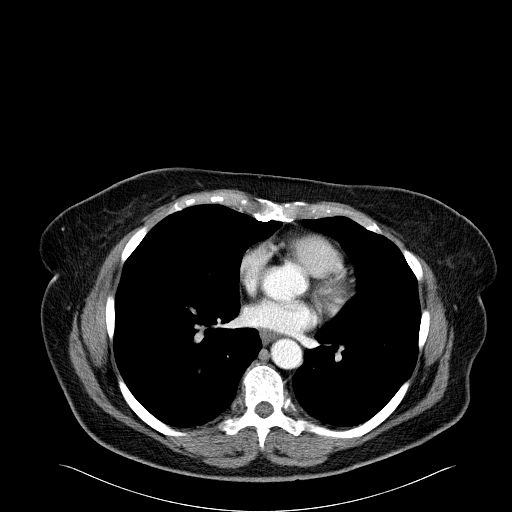
[im 94/128  lung]
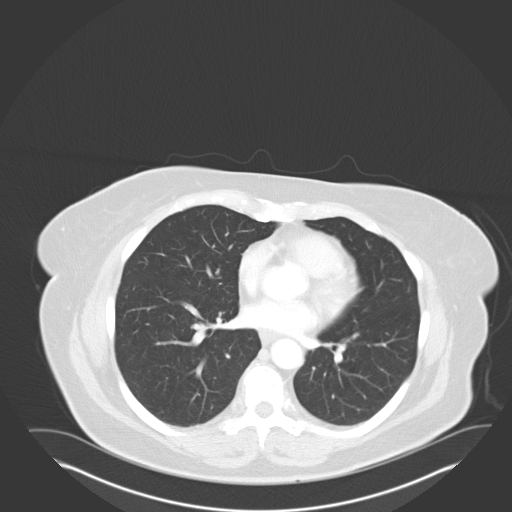
[im 111/128  lung]
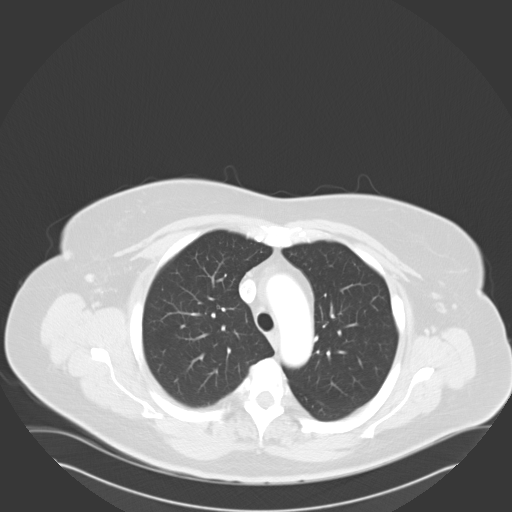
[im 119/128  lung]
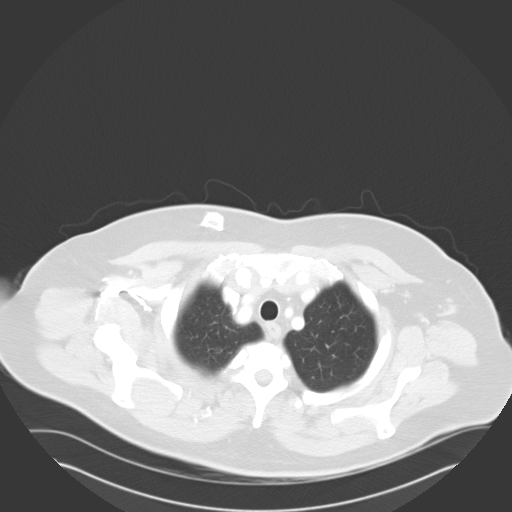

[Series 602: cor · coronal · 1.28mm/px · 3 of 100 slices shown]
[im 20/100  lung]
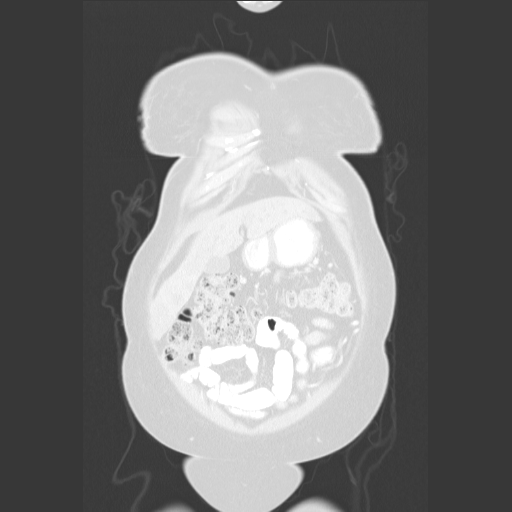
[im 40/100  lung]
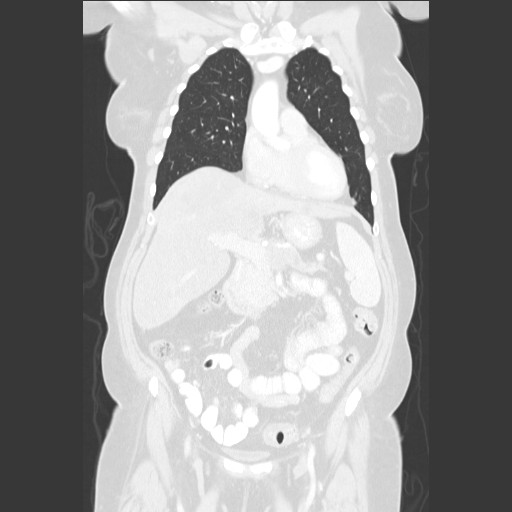
[im 60/100  lung]
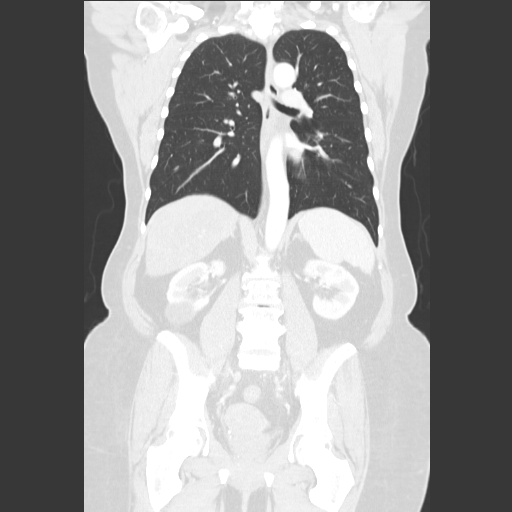

[14 of 36 positions shown; findings below may reference images not displayed]

FINDINGS: CT CHEST FINDINGS

Mediastinum/Nodes: Right-sided Port-A-Cath tip is in the distal SVC,
just proximal to the SVC/ RA junction. There is no axillary
lymphadenopathy. No mediastinal lymphadenopathy. There is no hilar
lymphadenopathy. The heart size is normal. No pericardial effusion.
Mild circumferential wall thickening noted in the distal esophagus,
similar to previous.

Lungs/Pleura: The lungs are clear without focal infiltrate, edema,
pneumothorax or pleural effusion. No evidence for pulmonary nodule.

Musculoskeletal: Bone windows reveal no worrisome lytic or sclerotic
osseous lesions.

CT ABDOMEN AND PELVIS FINDINGS

Hepatobiliary: Multiple liver metastases are again noted,
superimposed upon geographic fatty infiltration of the liver
parenchyma. Index lesion in the posterior aspect of the medial
segment left liver measures 2.7 cm today compared to 2.7 cm
previously. 2.1 cm lesion in the anterior right liver (image 59
series 2 was 2.0 cm when I remeasure on the prior study.

The left hepatic biliary dilatation persists without substantial
change, likely secondary to metastatic involvement of the biliary
ducts. There is no evidence for gallstones, gallbladder wall
thickening, or pericholecystic fluid. No evidence for extrahepatic
biliary duct dilatation.

Pancreas: Hypo enhancing lesion in the pancreatic body measures
x 3.1 cm today compared to 5.4 x 3.2 cm previously. There is atrophy
of the pancreatic parenchyma in the tail with associated dilatation
of the main duct.

Spleen: No splenomegaly. No focal mass lesion.

Adrenals/Urinary Tract: No adrenal nodule or mass. 5.2 cm cyst in
the lower pole of the right kidney was 4.8 cm in maximum dimension
previously. No evidence for an enhancing lesion in either kidney. No
hydronephrosis. No evidence for hydroureter. The urinary bladder
appears normal for the degree of distention.

Stomach/Bowel: Stomach is nondistended. No gastric wall thickening.
No evidence of outlet obstruction. Duodenum is normally positioned
as is the ligament of Treitz. No small bowel wall thickening. No
small bowel dilatation. The terminal ileum is normal. The appendix
is normal. Diverticular changes are noted in the left colon without
evidence of diverticulitis.

Vascular/Lymphatic: There is no gastrohepatic or hepatoduodenal
ligament lymphadenopathy. No intraperitoneal or retroperitoneal
lymphadenopy. No mesenteric or pelvic sidewall lymphadenopathy.

Portal vein is patent. Portal splenic confluence is markedly
attenuated by tumor. Splenic vein appears obliterated.

Reproductive: The uterus has normal CT imaging appearance. There is
no adnexal mass.

Other: No intraperitoneal free fluid.

Musculoskeletal: There is a new sclerotic focus in the left aspect
of the L1 vertebral body (image 59 62 series 2) raising concern for
new bony metastatic involvement.
IMPRESSION: 1. No evidence for metastatic disease in the thorax.
2. Mild circumferential wall thickening in the distal esophagus,
stable. This may be related to esophagitis.
3. No substantial change in hypo enhancing lesion in the body the
pancreas.
4. Multiple hepatic metastases show no substantial interval change.
Metastatic involvement of the liver is seen on a background of
geographic steatosis and left intrahepatic biliary duct dilatation
is stable.
5. Obliteration of the portal splenic confluence and splenic vein
secondary to tumor involvement.
6. There is a new small area of sclerosis in the left aspect of the
L1 vertebral body. Attention to this region on followup is
recommended as bony metastatic disease is a concern.
